# Patient Record
Sex: Female | Born: 1940 | Race: White | Hispanic: No | Marital: Married | Smoking: Current some day smoker
Health system: Southern US, Community
[De-identification: ages and names within clinical notes are randomized; demographics above are authoritative.]

## PROBLEM LIST (undated history)

## (undated) DIAGNOSIS — N390 Urinary tract infection, site not specified: Secondary | ICD-10-CM

## (undated) DIAGNOSIS — I639 Cerebral infarction, unspecified: Secondary | ICD-10-CM

## (undated) DIAGNOSIS — Z720 Tobacco use: Secondary | ICD-10-CM

## (undated) DIAGNOSIS — R42 Dizziness and giddiness: Secondary | ICD-10-CM

## (undated) DIAGNOSIS — F0390 Unspecified dementia without behavioral disturbance: Secondary | ICD-10-CM

## (undated) DIAGNOSIS — I10 Essential (primary) hypertension: Secondary | ICD-10-CM

---

## 1998-04-13 ENCOUNTER — Other Ambulatory Visit: Admission: RE | Admit: 1998-04-13 | Discharge: 1998-04-13 | Payer: Self-pay | Admitting: Family Medicine

## 2013-08-19 ENCOUNTER — Encounter (HOSPITAL_COMMUNITY): Payer: Self-pay | Admitting: Emergency Medicine

## 2013-08-19 ENCOUNTER — Emergency Department (HOSPITAL_COMMUNITY): Payer: Medicare Other

## 2013-08-19 ENCOUNTER — Inpatient Hospital Stay (HOSPITAL_COMMUNITY)
Admission: EM | Admit: 2013-08-19 | Discharge: 2013-08-20 | DRG: 066 | Disposition: A | Discharge status: Home / Self Care | Payer: Medicare Other | Attending: Internal Medicine | Admitting: Internal Medicine

## 2013-08-19 DIAGNOSIS — I6789 Other cerebrovascular disease: Secondary | ICD-10-CM | POA: Diagnosis present

## 2013-08-19 DIAGNOSIS — I639 Cerebral infarction, unspecified: Secondary | ICD-10-CM | POA: Diagnosis present

## 2013-08-19 DIAGNOSIS — F17200 Nicotine dependence, unspecified, uncomplicated: Secondary | ICD-10-CM | POA: Diagnosis present

## 2013-08-19 DIAGNOSIS — E785 Hyperlipidemia, unspecified: Secondary | ICD-10-CM | POA: Diagnosis present

## 2013-08-19 DIAGNOSIS — I63522 Cerebral infarction due to unspecified occlusion or stenosis of left anterior cerebral artery: Secondary | ICD-10-CM | POA: Diagnosis present

## 2013-08-19 DIAGNOSIS — R42 Dizziness and giddiness: Secondary | ICD-10-CM

## 2013-08-19 DIAGNOSIS — I10 Essential (primary) hypertension: Secondary | ICD-10-CM | POA: Diagnosis present

## 2013-08-19 DIAGNOSIS — I6350 Cerebral infarction due to unspecified occlusion or stenosis of unspecified cerebral artery: Principal | ICD-10-CM | POA: Diagnosis present

## 2013-08-19 DIAGNOSIS — Z720 Tobacco use: Secondary | ICD-10-CM

## 2013-08-19 DIAGNOSIS — R269 Unspecified abnormalities of gait and mobility: Secondary | ICD-10-CM | POA: Diagnosis present

## 2013-08-19 DIAGNOSIS — R29810 Facial weakness: Secondary | ICD-10-CM | POA: Diagnosis present

## 2013-08-19 HISTORY — DX: Tobacco use: Z72.0

## 2013-08-19 HISTORY — DX: Dizziness and giddiness: R42

## 2013-08-19 HISTORY — DX: Essential (primary) hypertension: I10

## 2013-08-19 LAB — BASIC METABOLIC PANEL
BUN: 10 mg/dL (ref 6–23)
CALCIUM: 10.4 mg/dL (ref 8.4–10.5)
CO2: 27 meq/L (ref 19–32)
Chloride: 102 mEq/L (ref 96–112)
Creatinine, Ser: 0.75 mg/dL (ref 0.50–1.10)
GFR calc Af Amer: >90 mL/min (ref >90)
GFR calc non Af Amer: 81 mL/min — ABNORMAL LOW (ref >90)
GLUCOSE: 111 mg/dL — AB (ref 70–99)
Potassium: 4.8 mEq/L (ref 3.7–5.3)
Sodium: 144 mEq/L (ref 137–147)

## 2013-08-19 LAB — I-STAT TROPONIN, ED: Troponin i, poc: 0 ng/mL (ref 0.00–0.08)

## 2013-08-19 LAB — URINALYSIS, ROUTINE W REFLEX MICROSCOPIC
BILIRUBIN URINE: NEGATIVE
Glucose, UA: NEGATIVE mg/dL
Hgb urine dipstick: NEGATIVE
Ketones, ur: NEGATIVE mg/dL
Leukocytes, UA: NEGATIVE
Nitrite: NEGATIVE
PH: 7.5 (ref 5.0–8.0)
Protein, ur: NEGATIVE mg/dL
SPECIFIC GRAVITY, URINE: 1.01 (ref 1.005–1.030)
Urobilinogen, UA: 0.2 mg/dL (ref 0.0–1.0)

## 2013-08-19 LAB — CBC
HCT: 45.7 % (ref 36.0–46.0)
HEMOGLOBIN: 16 g/dL — AB (ref 12.0–15.0)
MCH: 33.2 pg (ref 26.0–34.0)
MCHC: 35 g/dL (ref 30.0–36.0)
MCV: 94.8 fL (ref 78.0–100.0)
Platelets: 300 10*3/uL (ref 150–400)
RBC: 4.82 MIL/uL (ref 3.87–5.11)
RDW: 12.7 % (ref 11.5–15.5)
WBC: 6.2 10*3/uL (ref 4.0–10.5)

## 2013-08-19 MED ORDER — NAPROXEN SODIUM 220 MG PO TABS: 220.0000 mg | ORAL_TABLET | Freq: Every day | ORAL | Status: DC | PRN

## 2013-08-19 MED ORDER — ASPIRIN 300 MG RE SUPP
300.0000 mg | Freq: Every day | RECTAL | Status: DC
  Filled 2013-08-19: qty 1

## 2013-08-19 MED ORDER — ACETAMINOPHEN 325 MG PO TABS: 650.0000 mg | ORAL_TABLET | ORAL | Status: DC | PRN

## 2013-08-19 MED ORDER — LORAZEPAM 2 MG/ML IJ SOLN: 1.0000 mg | Freq: Once | INTRAMUSCULAR | Status: DC

## 2013-08-19 MED ORDER — LORAZEPAM 1 MG PO TABS
1.0000 mg | ORAL_TABLET | Freq: Once | ORAL | Status: AC
  Administered 2013-08-19: 1 mg via ORAL
  Filled 2013-08-19: qty 1

## 2013-08-19 MED ORDER — ACETAMINOPHEN 650 MG RE SUPP: 650.0000 mg | RECTAL | Status: DC | PRN

## 2013-08-19 MED ORDER — ASPIRIN 325 MG PO TABS
325.0000 mg | ORAL_TABLET | Freq: Every day | ORAL | Status: DC
  Administered 2013-08-19 – 2013-08-20 (×2): 325 mg via ORAL
  Filled 2013-08-19 (×2): qty 1

## 2013-08-19 MED ORDER — TETRAHYDROZOLINE HCL 0.05 % OP SOLN
2.0000 [drp] | Freq: Two times a day (BID) | OPHTHALMIC | Status: DC | PRN
  Filled 2013-08-19: qty 15

## 2013-08-19 MED ORDER — SENNOSIDES-DOCUSATE SODIUM 8.6-50 MG PO TABS: 1.0000 | ORAL_TABLET | Freq: Every evening | ORAL | Status: DC | PRN

## 2013-08-19 MED ORDER — ONDANSETRON HCL 4 MG/2ML IJ SOLN: 4.0000 mg | Freq: Four times a day (QID) | INTRAMUSCULAR | Status: DC | PRN

## 2013-08-19 MED ORDER — ENOXAPARIN SODIUM 40 MG/0.4ML ~~LOC~~ SOLN
40.0000 mg | SUBCUTANEOUS | Status: DC
  Filled 2013-08-19 (×2): qty 0.4

## 2013-08-19 MED ORDER — NAPROXEN 250 MG PO TABS
250.0000 mg | ORAL_TABLET | Freq: Every day | ORAL | Status: DC | PRN
  Filled 2013-08-19: qty 1

## 2013-08-19 MED ORDER — LORAZEPAM 2 MG/ML IJ SOLN: 0.5000 mg | Freq: Once | INTRAMUSCULAR | Status: DC

## 2013-08-19 MED ORDER — SODIUM CHLORIDE 0.9 % IV SOLN
INTRAVENOUS | Status: DC
  Administered 2013-08-19: 16:00:00 via INTRAVENOUS

## 2013-08-19 NOTE — ED Notes (Signed)
Dr. Thad Rangereynolds, Neurology also informed of pts facial droop which was changed from the baseline NIH.

## 2013-08-19 NOTE — ED Notes (Signed)
Called MRI and requested that they call 30 minutes prior to the exam so I could administer the Ativan. MRI tech agreed to do so.

## 2013-08-19 NOTE — ED Provider Notes (Addendum)
CSN: [id number]     Arrival date & time 08/19/13  7564 History   First MD Initiated Contact with Patient 08/19/13 1010     Chief Complaint  Patient presents with  . Dizziness     (Consider location/radiation/quality/duration/timing/severity/associated sxs/prior Treatment) Patient is a 73 y.o. female presenting with dizziness. The history is provided by the patient.  Dizziness Description: feeling off balance and drunk. Severity:  Moderate Onset quality:  Gradual Duration:  1 week Timing:  Intermittent Progression:  Unchanged Chronicity:  New Context comment:  Seems to be most significant when she has been on her feet for awhile and gets tired and then tries to walk Relieved by: sitting down and resting. Exacerbated by: walking. Ineffective treatments:  None tried Associated symptoms: headaches   Associated symptoms: no chest pain, no diarrhea, no nausea, no palpitations, no shortness of breath, no syncope, no vision changes, no vomiting and no weakness   Associated symptoms comment:  Headache about 6 days ago when sx started but has not continued and no headache now Risk factors: hx of vertigo   Risk factors: no hx of stroke and no new medications     Past Medical History  Diagnosis Date  . Hypertension   . Vertigo    History reviewed. No pertinent past surgical history. History reviewed. No pertinent family history. History  Substance Use Topics  . Smoking status: Current Some Day Smoker    Types: Cigarettes  . Smokeless tobacco: Not on file  . Alcohol Use: No   OB History   Grav Para Term Preterm Abortions TAB SAB Ect Mult Living                 Review of Systems  Respiratory: Negative for shortness of breath.   Cardiovascular: Negative for chest pain, palpitations and syncope.  Gastrointestinal: Negative for nausea, vomiting and diarrhea.  Neurological: Positive for dizziness and headaches.  All other systems reviewed and are negative.     Allergies   Review of patient's allergies indicates no known allergies.  Home Medications  No current outpatient prescriptions on file. BP 190/91  Pulse 88  Temp(Src) 98.5 F (36.9 C) (Oral)  Resp 18  Ht 5\' 4"  (1.626 m)  Wt 142 lb (64.411 kg)  BMI 24.36 kg/m2  SpO2 99% Physical Exam  Nursing note and vitals reviewed. Constitutional: She is oriented to person, place, and time. She appears well-developed and well-nourished. No distress.  HENT:  Head: Normocephalic and atraumatic.  Right Ear: Tympanic membrane and ear canal normal.  Left Ear: Tympanic membrane and ear canal normal.  Mouth/Throat: Oropharynx is clear and moist.  Eyes: Conjunctivae and EOM are normal. Pupils are equal, round, and reactive to light.  Neck: Normal range of motion. Neck supple.  Cardiovascular: Normal rate, regular rhythm and intact distal pulses.   No murmur heard. Pulmonary/Chest: Effort normal and breath sounds normal. No respiratory distress. She has no wheezes. She has no rales.  Abdominal: Soft. She exhibits no distension. There is no tenderness. There is no rebound and no guarding.  Musculoskeletal: Normal range of motion. She exhibits no edema and no tenderness.  Neurological: She is alert and oriented to person, place, and time. She has normal strength. No cranial nerve deficit or sensory deficit. Coordination normal.  Normal finger to nose and heel to shin testing.  NO visual field cuts  Skin: Skin is warm and dry. No rash noted. No erythema.  Psychiatric: She has a normal mood and affect. Her behavior  is normal.    ED Course  Procedures (including critical care time) Labs Review Labs Reviewed  CBC - Abnormal; Notable for the following:    Hemoglobin 16.0 (*)    All other components within normal limits  BASIC METABOLIC PANEL - Abnormal; Notable for the following:    Glucose, Bld 111 (*)    GFR calc non Af Amer 81 (*)    All other components within normal limits  URINALYSIS, ROUTINE W REFLEX  MICROSCOPIC  I-STAT TROPOININ, ED   Imaging Review Dg Chest 2 View  08/19/2013   CLINICAL DATA:  Stroke  EXAM: CHEST  2 VIEW  COMPARISON:  None.  FINDINGS: The heart size and mediastinal contours are within normal limits. Both lungs are clear. The visualized skeletal structures are unremarkable.  IMPRESSION: No active cardiopulmonary disease.   Electronically Signed   By: Salome Holmes M.D.   On: 08/19/2013 11:28   Ct Head Wo Contrast  08/19/2013   CLINICAL DATA:  Abnormal gait  EXAM: CT HEAD WITHOUT CONTRAST  TECHNIQUE: Contiguous axial images were obtained from the base of the skull through the vertex without intravenous contrast.  COMPARISON:  None.  FINDINGS: No acute intracranial abnormality. Specifically, no hemorrhage, hydrocephalus, mass lesion, acute infarction, or significant intracranial injury. No acute calvarial abnormality. Age-appropriate atrophy. Scattered of mild areas of low-attenuation within the deep white matter regions. Consistent with small vessel white matter ischaemia. Likely mucous retention cyst base of the right maxillary sinus. Areas of mucosal thickening and opacification appreciated within the ethmoid air cells and to a lesser extent the sphenoid sinuses. The mastoid air cells are patent. .  IMPRESSION: No acute intracranial abnormality. Chronic and involutional changes.  Sinus disease within the ethmoid air cells, sphenoid air cells.  Likely mucous retention cyst within the right maxillary sinus.   Electronically Signed   By: Salome Holmes M.D.   On: 08/19/2013 11:40   Mr Maxine Glenn Head Wo Contrast  08/19/2013   CLINICAL DATA:  Dizziness.  Cerebellar stroke versus vertigo.  EXAM: MRI HEAD WITHOUT CONTRAST  MRA HEAD WITHOUT CONTRAST  TECHNIQUE: Multiplanar, multiecho pulse sequences of the brain and surrounding structures were obtained without intravenous contrast. Angiographic images of the head were obtained using MRA technique without contrast.  COMPARISON:  Head CT 08/19/2013   FINDINGS: MRI HEAD FINDINGS  Incidental note is made of a partially empty sella. There are several small foci of acute infarction in the left lateral lenticulostriate territory involving the corona radiata and lentiform nucleus. Small, scattered foci of T2 hyperintensity are present throughout the subcortical and deep cerebral white matter bilaterally, nonspecific but compatible with mild chronic small vessel ischemic disease. There is mild-to-moderate generalized cerebral atrophy. There is no evidence of mass, midline shift, or extra-axial fluid collection.  Orbits are unremarkable. Right maxillary sinus mucous retention cyst is noted. There is mild bilateral ethmoid and left sphenoid sinus mucosal thickening. Mastoid air cells are clear. Major intracranial vascular flow voids are preserved.  MRA HEAD FINDINGS  Visualized distal vertebral arteries are patent and codominant. PICA origins are patent bilaterally. AICA origins are patent. Basilar artery is patent without stenosis. The left P1 segment is hypoplastic, and there is a prominent left posterior communicating artery. PCAs are otherwise unremarkable.  Internal carotid arteries are patent from skullbase to carotid termini. Symmetrically diminished signal within the anterior cavernous carotids bilaterally is likely artifactual. The left supraclinoid ICA is slightly smaller in caliber than the right without focal stenosis. The left A1 segment  is markedly hypoplastic, with the majority of flow in the left ACA being supplied from the right via the anterior communicating artery. ACAs are otherwise unremarkable. MCAs are unremarkable. No intracranial aneurysm is identified.  IMPRESSION: 1. Small, acute left lateral lenticulostriate infarct. 2. 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 3. No evidence of major intracranial arterial occlusion or significant proximal stenosis.   Electronically Signed   By: Sebastian AcheAllen  Grady   On: 08/19/2013 13:54   Mr  Brain Wo Contrast  08/19/2013   CLINICAL DATA:  Dizziness.  Cerebellar stroke versus vertigo.  EXAM: MRI HEAD WITHOUT CONTRAST  MRA HEAD WITHOUT CONTRAST  TECHNIQUE: Multiplanar, multiecho pulse sequences of the brain and surrounding structures were obtained without intravenous contrast. Angiographic images of the head were obtained using MRA technique without contrast.  COMPARISON:  Head CT 08/19/2013  FINDINGS: MRI HEAD FINDINGS  Incidental note is made of a partially empty sella. There are several small foci of acute infarction in the left lateral lenticulostriate territory involving the corona radiata and lentiform nucleus. Small, scattered foci of T2 hyperintensity are present throughout the subcortical and deep cerebral white matter bilaterally, nonspecific but compatible with mild chronic small vessel ischemic disease. There is mild-to-moderate generalized cerebral atrophy. There is no evidence of mass, midline shift, or extra-axial fluid collection.  Orbits are unremarkable. Right maxillary sinus mucous retention cyst is noted. There is mild bilateral ethmoid and left sphenoid sinus mucosal thickening. Mastoid air cells are clear. Major intracranial vascular flow voids are preserved.  MRA HEAD FINDINGS  Visualized distal vertebral arteries are patent and codominant. PICA origins are patent bilaterally. AICA origins are patent. Basilar artery is patent without stenosis. The left P1 segment is hypoplastic, and there is a prominent left posterior communicating artery. PCAs are otherwise unremarkable.  Internal carotid arteries are patent from skullbase to carotid termini. Symmetrically diminished signal within the anterior cavernous carotids bilaterally is likely artifactual. The left supraclinoid ICA is slightly smaller in caliber than the right without focal stenosis. The left A1 segment is markedly hypoplastic, with the majority of flow in the left ACA being supplied from the right via the anterior  communicating artery. ACAs are otherwise unremarkable. MCAs are unremarkable. No intracranial aneurysm is identified.  IMPRESSION: 1. Small, acute left lateral lenticulostriate infarct. 2. 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 3. No evidence of major intracranial arterial occlusion or significant proximal stenosis.   Electronically Signed   By: Sebastian AcheAllen  Grady   On: 08/19/2013 13:54     EKG Interpretation   Date/Time:  Thursday August 19 2013 09:50:39 EDT Ventricular Rate:  85 PR Interval:  156 QRS Duration: 80 QT Interval:  362 QTC Calculation: 430 R Axis:   41 Text Interpretation:  Normal sinus rhythm Right atrial enlargement No  previous tracing Confirmed by Anitra LauthPLUNKETT  MD, Alphonzo LemmingsWHITNEY ([id number]) on 08/19/2013  10:21:52 AM      MDM   Final diagnoses:  Stroke    Pt with sx most concerning for cerebellar stroke vs vertigo that started most likely about 1 week ago.  No systemic or infectious sx.  Normal neuro exam without weakness, visual field cuts or overt cerebellar findings however pt states sx are not prominent until she had been up for a long time.  Normal vision.  Sx are not reproducible with movement of the head and attempting to walk.  No hx of Stroke but possible hx of untreated hypertension and HTN here.  Evaluate for stroke starting  with a CT but if negative will get an MRI of the brain. CBC, BMP, UA, troponin pending. EKG without acute findings.   11:56 AM Initial labs and imaging without acute findings.  MRI pending.  CT does show sinus dx.  Pt passed swallow study.  BP improved without intervention to 143/80  1:59 PM MRI positive for acute infarct and will admit for full stroke work up. Gwyneth Sprout, MD 08/19/13 1157  Gwyneth Sprout, MD 08/19/13 1400  Gwyneth Sprout, MD 08/19/13 1504

## 2013-08-19 NOTE — Consult Note (Signed)
Referring Physician: Janee Morn    Chief Complaint: Stroke  HPI:                                                                                                                                         Yolanda Hopkins is an 74 y.o. female who has been diagnosed with vertigo "by her dentist" a long time ago.  For the last two week she has been having a sensation like she was going to have vertigo but she would shut her eyes and pray --the sensation would pass.  Today she woke up feeling generally fatigued.  She took a bath and told her husband.  He decided to bring her to hospital.  She states she felt as though she was going to have vertigo but never actually had the sensation. Other than feeling generally weak, she denies any unilateral weakness, visual problem, dysarthric, dysphagia, sensory abnormalities. Of note--she feels ASA in the past has caused her to have kidney stones. She currently is not on ASA.   Date last known well: Date: 08/19/2013 Time last known well: Time: 20:00 tPA Given: No: out of window and minimal symptoms  Past Medical History  Diagnosis Date  . Hypertension   . Vertigo   . Tobacco abuse 08/19/2013    History reviewed. No pertinent past surgical history.  Family History  Problem Relation Age of Onset  . Hypertension Mother   . Hypertension Father   . Liver cancer Father    Social History:  reports that she has been smoking Cigarettes.  She has been smoking about 0.00 packs per day. She does not have any smokeless tobacco history on file. She reports that she does not drink alcohol or use illicit drugs.  Allergies:  Allergies  Allergen Reactions  . Sulfur Nausea Only    Medications:                                                                                                                           Current Facility-Administered Medications  Medication Dose Route Frequency Provider Last Rate Last Dose  . 0.9 %  sodium chloride infusion   Intravenous  Continuous Rodolph Bong, MD 75 mL/hr at 08/19/13 1600    . acetaminophen (TYLENOL) tablet 650 mg  650 mg Oral Q4H PRN Rodolph Bong, MD       Or  .  acetaminophen (TYLENOL) suppository 650 mg  650 mg Rectal Q4H PRN Rodolph Bong, MD      . aspirin suppository 300 mg  300 mg Rectal Daily Rodolph Bong, MD       Or  . aspirin tablet 325 mg  325 mg Oral Daily Rodolph Bong, MD   325 mg at 08/19/13 1601  . enoxaparin (LOVENOX) injection 40 mg  40 mg Subcutaneous Q24H Rodolph Bong, MD      . ondansetron [facility]) injection 4 mg  4 mg Intravenous Q6H PRN Rodolph Bong, MD      . senna-docusate (Senokot-S) tablet 1 tablet  1 tablet Oral QHS PRN Rodolph Bong, MD       Current Outpatient Prescriptions  Medication Sig Dispense Refill  . diphenhydrAMINE (BENADRYL) 25 MG tablet Take 25 mg by mouth every 6 (six) hours as needed for allergies.      . naproxen sodium (ANAPROX) 220 MG tablet Take 220 mg by mouth daily as needed (pain).      . Tetrahydrozoline HCl (VISINE OP) Place 2 drops into both eyes 2 (two) times daily as needed (dry eyes).         ROS:                                                                                                                                       History obtained from the patient  General ROS: negative for - chills, fatigue, fever, night sweats, weight gain or weight loss Psychological ROS: negative for - behavioral disorder, hallucinations, memory difficulties, mood swings or suicidal ideation Ophthalmic ROS: negative for - blurry vision, double vision, eye pain or loss of vision ENT ROS: negative for - epistaxis, nasal discharge, oral lesions, sore throat, tinnitus or vertigo Allergy and Immunology ROS: negative for - hives or itchy/watery eyes Hematological and Lymphatic ROS: negative for - bleeding problems, bruising or swollen lymph nodes Endocrine ROS: negative for - galactorrhea, hair pattern changes, polydipsia/polyuria  or temperature intolerance Respiratory ROS: negative for - cough, hemoptysis, shortness of breath or wheezing Cardiovascular ROS: negative for - chest pain, dyspnea on exertion, edema or irregular heartbeat Gastrointestinal ROS: negative for - abdominal pain, diarrhea, hematemesis, nausea/vomiting or stool incontinence Genito-Urinary ROS: negative for - dysuria, hematuria, incontinence or urinary frequency/urgency Musculoskeletal ROS: negative for - joint swelling or muscular weakness Neurological ROS: as noted in HPI Dermatological ROS: negative for rash and skin lesion changes  Neurologic Examination:  Blood pressure 162/87, pulse 94, temperature 97.4 F (36.3 C), temperature source Oral, resp. rate 16, height 5\' 4"  (1.626 m), weight 64.411 kg (142 lb), SpO2 98.00%.   Mental Status: Alert, oriented, thought content appropriate.  Speech fluent without evidence of aphasia.  Able to follow 3 step commands without difficulty. Cranial Nerves: II: Discs flat bilaterally; Visual fields grossly normal, pupils equal, round, reactive to light and accommodation III,IV, VI: ptosis not present, extra-ocular motions intact bilaterally V,VII: smile asymmetric on the left, facial light touch sensation normal bilaterally VIII: hearing normal bilaterally IX,X: gag reflex present XI: bilateral shoulder shrug XII: midline tongue extension without atrophy or fasciculations  Motor: Right : Upper extremity   5/5    Left:     Upper extremity   5/5  Lower extremity   5/5     Lower extremity   5/5 --slight pronator drift on the right arm and slight drift on the right leg Tone and bulk:normal tone throughout; no atrophy noted Sensory: Pinprick and light touch intact throughout, bilaterally Deep Tendon Reflexes:  Right: Upper Extremity   Left: Upper extremity   biceps (C-5 to C-6) 2/4   biceps (C-5 to C-6)  2/4 tricep (C7) 2/4    triceps (C7) 2/4 Brachioradialis (C6) 2/4  Brachioradialis (C6) 2/4  Lower Extremity Lower Extremity  quadriceps (L-2 to L-4) 2/4   quadriceps (L-2 to L-4) 2/4 Achilles (S1) 1/4   Achilles (S1) 1/4  Plantars: Right: downgoing   Left: downgoing Cerebellar: normal finger-to-nose,  normal heel-to-shin test Gait: Unable to test CV: pulses palpable throughout    Lab Results: Basic Metabolic Panel:  Recent Labs Lab 08/19/13 0949  NA 144  K 4.8  CL 102  CO2 27  GLUCOSE 111*  BUN 10  CREATININE 0.75  CALCIUM 10.4    Liver Function Tests: No results found for this basename: AST, ALT, ALKPHOS, BILITOT, PROT, ALBUMIN,  in the last 168 hours No results found for this basename: LIPASE, AMYLASE,  in the last 168 hours No results found for this basename: AMMONIA,  in the last 168 hours  CBC:  Recent Labs Lab 08/19/13 0949  WBC 6.2  HGB 16.0*  HCT 45.7  MCV 94.8  PLT 300    Cardiac Enzymes: No results found for this basename: CKTOTAL, CKMB, CKMBINDEX, TROPONINI,  in the last 168 hours  Lipid Panel: No results found for this basename: CHOL, TRIG, HDL, CHOLHDL, VLDL, LDLCALC,  in the last 168 hours  CBG: No results found for this basename: GLUCAP,  in the last 168 hours  Microbiology: No results found for this or any previous visit.  Coagulation Studies: No results found for this basename: LABPROT, INR,  in the last 72 hours  Imaging: Dg Chest 2 View  08/19/2013   CLINICAL DATA:  Stroke  EXAM: CHEST  2 VIEW  COMPARISON:  None.  FINDINGS: The heart size and mediastinal contours are within normal limits. Both lungs are clear. The visualized skeletal structures are unremarkable.  IMPRESSION: No active cardiopulmonary disease.   Electronically Signed   By: Salome HolmesHector  Cooper M.D.   On: 08/19/2013 11:28   Ct Head Wo Contrast  08/19/2013   CLINICAL DATA:  Abnormal gait  EXAM: CT HEAD WITHOUT CONTRAST  TECHNIQUE: Contiguous axial images were obtained from  the base of the skull through the vertex without intravenous contrast.  COMPARISON:  None.  FINDINGS: No acute intracranial abnormality. Specifically, no hemorrhage, hydrocephalus, mass lesion, acute infarction, or significant intracranial injury. No  acute calvarial abnormality. Age-appropriate atrophy. Scattered of mild areas of low-attenuation within the deep white matter regions. Consistent with small vessel white matter ischaemia. Likely mucous retention cyst base of the right maxillary sinus. Areas of mucosal thickening and opacification appreciated within the ethmoid air cells and to a lesser extent the sphenoid sinuses. The mastoid air cells are patent. .  IMPRESSION: No acute intracranial abnormality. Chronic and involutional changes.  Sinus disease within the ethmoid air cells, sphenoid air cells.  Likely mucous retention cyst within the right maxillary sinus.   Electronically Signed   By: Salome Holmes M.D.   On: 08/19/2013 11:40   Mr Maxine Glenn Head Wo Contrast  08/19/2013   CLINICAL DATA:  Dizziness.  Cerebellar stroke versus vertigo.  EXAM: MRI HEAD WITHOUT CONTRAST  MRA HEAD WITHOUT CONTRAST  TECHNIQUE: Multiplanar, multiecho pulse sequences of the brain and surrounding structures were obtained without intravenous contrast. Angiographic images of the head were obtained using MRA technique without contrast.  COMPARISON:  Head CT 08/19/2013  FINDINGS: MRI HEAD FINDINGS  Incidental note is made of a partially empty sella. There are several small foci of acute infarction in the left lateral lenticulostriate territory involving the corona radiata and lentiform nucleus. Small, scattered foci of T2 hyperintensity are present throughout the subcortical and deep cerebral white matter bilaterally, nonspecific but compatible with mild chronic small vessel ischemic disease. There is mild-to-moderate generalized cerebral atrophy. There is no evidence of mass, midline shift, or extra-axial fluid collection.  Orbits are  unremarkable. Right maxillary sinus mucous retention cyst is noted. There is mild bilateral ethmoid and left sphenoid sinus mucosal thickening. Mastoid air cells are clear. Major intracranial vascular flow voids are preserved.  MRA HEAD FINDINGS  Visualized distal vertebral arteries are patent and codominant. PICA origins are patent bilaterally. AICA origins are patent. Basilar artery is patent without stenosis. The left P1 segment is hypoplastic, and there is a prominent left posterior communicating artery. PCAs are otherwise unremarkable.  Internal carotid arteries are patent from skullbase to carotid termini. Symmetrically diminished signal within the anterior cavernous carotids bilaterally is likely artifactual. The left supraclinoid ICA is slightly smaller in caliber than the right without focal stenosis. The left A1 segment is markedly hypoplastic, with the majority of flow in the left ACA being supplied from the right via the anterior communicating artery. ACAs are otherwise unremarkable. MCAs are unremarkable. No intracranial aneurysm is identified.  IMPRESSION: 1. Small, acute left lateral lenticulostriate infarct. 2. 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 3. No evidence of major intracranial arterial occlusion or significant proximal stenosis.   Electronically Signed   By: Sebastian Ache   On: 08/19/2013 13:54   Mr Brain Wo Contrast  08/19/2013   CLINICAL DATA:  Dizziness.  Cerebellar stroke versus vertigo.  EXAM: MRI HEAD WITHOUT CONTRAST  MRA HEAD WITHOUT CONTRAST  TECHNIQUE: Multiplanar, multiecho pulse sequences of the brain and surrounding structures were obtained without intravenous contrast. Angiographic images of the head were obtained using MRA technique without contrast.  COMPARISON:  Head CT 08/19/2013  FINDINGS: MRI HEAD FINDINGS  Incidental note is made of a partially empty sella. There are several small foci of acute infarction in the left lateral lenticulostriate  territory involving the corona radiata and lentiform nucleus. Small, scattered foci of T2 hyperintensity are present throughout the subcortical and deep cerebral white matter bilaterally, nonspecific but compatible with mild chronic small vessel ischemic disease. There is mild-to-moderate generalized cerebral atrophy. There is no evidence of  mass, midline shift, or extra-axial fluid collection.  Orbits are unremarkable. Right maxillary sinus mucous retention cyst is noted. There is mild bilateral ethmoid and left sphenoid sinus mucosal thickening. Mastoid air cells are clear. Major intracranial vascular flow voids are preserved.  MRA HEAD FINDINGS  Visualized distal vertebral arteries are patent and codominant. PICA origins are patent bilaterally. AICA origins are patent. Basilar artery is patent without stenosis. The left P1 segment is hypoplastic, and there is a prominent left posterior communicating artery. PCAs are otherwise unremarkable.  Internal carotid arteries are patent from skullbase to carotid termini. Symmetrically diminished signal within the anterior cavernous carotids bilaterally is likely artifactual. The left supraclinoid ICA is slightly smaller in caliber than the right without focal stenosis. The left A1 segment is markedly hypoplastic, with the majority of flow in the left ACA being supplied from the right via the anterior communicating artery. ACAs are otherwise unremarkable. MCAs are unremarkable. No intracranial aneurysm is identified.  IMPRESSION: 1. Small, acute left lateral lenticulostriate infarct. 2. 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 3. No evidence of major intracranial arterial occlusion or significant proximal stenosis.   Electronically Signed   By: Sebastian Ache   On: 08/19/2013 13:54    Felicie Morn PA-C [facility] [phone]  08/19/2013, 5:01 PM   Patient seen and examined.  Clinical course and management discussed.  Necessary edits  performed.  I agree with the above.  Assessment and plan of care developed and discussed below.    Assessment: 73 y.o. female presenting with complaints vertigo and elevated blood pressure.  Neurological examination only significant for a mild facial droop.  MRI of the brain reviewed and shows a left acute lateral lenticulostriate infarct.  Further work up recommended.    Stroke Risk Factors - hypertension  Recommendations: 1. HgbA1c, fasting lipid panel 2. PT consult, OT consult, Speech consult 3. Echocardiogram 4. Carotid dopplers 5. Prophylactic therapy-Antiplatelet med: Aspirin - dose 325mg  daily 6. Risk factor modification 7. Telemetry monitoring 8. Frequent neuro checks   Thana Farr, MD [facility] [phone]  08/19/2013  5:07 PM

## 2013-08-19 NOTE — ED Notes (Signed)
PT moved to C-28 waiting for ADM. Bed.

## 2013-08-19 NOTE — ED Notes (Signed)
Neuro PA informed of facial droop which was changed from the baseline NIH.

## 2013-08-19 NOTE — Progress Notes (Signed)
Attempted to get get report from ED twice. Told Nurse on the phone giving report and will call back. Awaiting return call.

## 2013-08-19 NOTE — ED Notes (Signed)
She states for past week shes had dizziness every time she stands for the past week. shes also had a headache and her BP has been high when she checked at home

## 2013-08-19 NOTE — H&P (Signed)
[facility] History and Physical  Yolanda Hopkins WUJ:[id number] DOB: [birth year] DOA: 08/19/2013  Referring physician: Dr Anitra Lauth PCP: Josue Hector, MD   Chief Complaint: dizziness and elevated BP.  HPI: Yolanda Hopkins is a 73 y.o. female  With past medical history significant for vertigo and per patient recently elevated blood pressure who presents to the ED with a six-day history of dizziness and elevated blood pressure. Patient states that the dizziness has been intermittent and which was doing house chores would have to sit down when she felt tied and as on the dizziness with sitting. Patient says she had to use the banisters to support herself and they have abnormal gait. Patient denies any spinning sensation. Patient denies any fevers, no chills, no chest pain, no nausea, no vomiting, nondominant pain, no melena, no hematemesis, no hematochezia. Patient denies any numbness or tingling. Patient denies any asymmetric weakness. Patient denies any facial droop. Patient does endorse some generalized weakness to the point where she dropped a coffee this morning on the day of admission. Patient's husband said that he checked her blood pressures on the day of admission and was elevated at 173/100 with a pulse rate of approximately 110. Patient also some complaints of right great toe pain. Patient was seen in the emergency room CT of the head which was done was negative. MRI of the head which was done was consistent with an acute stroke. Basic metabolic profile is unremarkable. CBC was unremarkable. Urinalysis was unremarkable. EKG showed normal sinus rhythm and right atrial enlargement. Due to timing of presentation patient was deemed not to be a TPA candidate. We were consulted to admit the patient for further evaluation and management.   Review of Systems: As per HPI otherwise negative. Constitutional:  No weight loss, night sweats, Fevers, chills, fatigue.  HEENT:  No  headaches, Difficulty swallowing,Tooth/dental problems,Sore throat,  No sneezing, itching, ear ache, nasal congestion, post nasal drip,  Cardio-vascular:  No chest pain, Orthopnea, PND, swelling in lower extremities, anasarca, dizziness, palpitations  GI:  No heartburn, indigestion, abdominal pain, nausea, vomiting, diarrhea, change in bowel habits, loss of appetite  Resp:  No shortness of breath with exertion or at rest. No excess mucus, no productive cough, No non-productive cough, No coughing up of blood.No change in color of mucus.No wheezing.No chest wall deformity  Skin:  no rash or lesions.  GU:  no dysuria, change in color of urine, no urgency or frequency. No flank pain.  Musculoskeletal:  No joint pain or swelling. No decreased range of motion. No back pain.  Psych:  No change in mood or affect. No depression or anxiety. No memory loss.   Past Medical History  Diagnosis Date  . Hypertension   . Vertigo   . Tobacco abuse 08/19/2013   History reviewed. No pertinent past surgical history. Social History:  reports that she has been smoking Cigarettes.  She has been smoking about 0.00 packs per day. She does not have any smokeless tobacco history on file. She reports that she does not drink alcohol or use illicit drugs.  Allergies  Allergen Reactions  . Sulfur Nausea Only    History reviewed. No pertinent family history.   Prior to Admission medications   Medication Sig Start Date End Date Taking? Authorizing Provider  diphenhydrAMINE (BENADRYL) 25 MG tablet Take 25 mg by mouth every 6 (six) hours as needed for allergies.   Yes Historical Provider, MD  naproxen sodium (ANAPROX) 220 MG tablet Take 220 mg by mouth  daily as needed (pain).   Yes Historical Provider, MD  Tetrahydrozoline HCl (VISINE OP) Place 2 drops into both eyes 2 (two) times daily as needed (dry eyes).   Yes Historical Provider, MD   Physical Exam: Filed Vitals:   08/19/13 1530  BP: 161/80  Pulse: 71    Temp:   Resp: 15    BP 161/80  Pulse 71  Temp(Src) 97.4 F (36.3 C) (Oral)  Resp 15  Ht 5\' 4"  (1.626 m)  Wt 64.411 kg (142 lb)  BMI 24.36 kg/m2  SpO2 96%  General:  Appears calm and comfortable Eyes: PERRLA, normal lids, irises & conjunctiva ENT: grossly normal hearing, lips & tongue. Dry mucous membranes. Neck: no LAD, masses or thyromegaly Cardiovascular: RRR, no m/r/g. No LE edema. Telemetry: NSR  Respiratory: CTA bilaterally, no w/r/r. Normal respiratory effort. Abdomen: soft, ntnd, positive bowel sounds, no rebound, no guarding. Skin: no rash or induration seen on limited exam Musculoskeletal: grossly normal tone BUE/BLE Psychiatric: grossly normal mood and affect, speech fluent and appropriate Neurologic: Alert and oriented x3. Cranial nerves II through XII are grossly intact. No focal deficits. Finger-to-nose is intact. Visual fields are intact. Sensation is intact. Unable to assess gait secondary to safety.           Labs on Admission:  Basic Metabolic Panel:  Recent Labs Lab 08/19/13 0949  NA 144  K 4.8  CL 102  CO2 27  GLUCOSE 111*  BUN 10  CREATININE 0.75  CALCIUM 10.4   Liver Function Tests: No results found for this basename: AST, ALT, ALKPHOS, BILITOT, PROT, ALBUMIN,  in the last 168 hours No results found for this basename: LIPASE, AMYLASE,  in the last 168 hours No results found for this basename: AMMONIA,  in the last 168 hours CBC:  Recent Labs Lab 08/19/13 0949  WBC 6.2  HGB 16.0*  HCT 45.7  MCV 94.8  PLT 300   Cardiac Enzymes: No results found for this basename: CKTOTAL, CKMB, CKMBINDEX, TROPONINI,  in the last 168 hours  BNP (last 3 results) No results found for this basename: PROBNP,  in the last 8760 hours CBG: No results found for this basename: GLUCAP,  in the last 168 hours  Radiological Exams on Admission: Dg Chest 2 View  08/19/2013   CLINICAL DATA:  Stroke  EXAM: CHEST  2 VIEW  COMPARISON:  None.  FINDINGS: The heart  size and mediastinal contours are within normal limits. Both lungs are clear. The visualized skeletal structures are unremarkable.  IMPRESSION: No active cardiopulmonary disease.   Electronically Signed   By: Salome Holmes M.D.   On: 08/19/2013 11:28   Ct Head Wo Contrast  08/19/2013   CLINICAL DATA:  Abnormal gait  EXAM: CT HEAD WITHOUT CONTRAST  TECHNIQUE: Contiguous axial images were obtained from the base of the skull through the vertex without intravenous contrast.  COMPARISON:  None.  FINDINGS: No acute intracranial abnormality. Specifically, no hemorrhage, hydrocephalus, mass lesion, acute infarction, or significant intracranial injury. No acute calvarial abnormality. Age-appropriate atrophy. Scattered of mild areas of low-attenuation within the deep white matter regions. Consistent with small vessel white matter ischaemia. Likely mucous retention cyst base of the right maxillary sinus. Areas of mucosal thickening and opacification appreciated within the ethmoid air cells and to a lesser extent the sphenoid sinuses. The mastoid air cells are patent. .  IMPRESSION: No acute intracranial abnormality. Chronic and involutional changes.  Sinus disease within the ethmoid air cells, sphenoid air cells.  Likely mucous retention cyst within the right maxillary sinus.   Electronically Signed   By: Salome HolmesHector  Cooper M.D.   On: 08/19/2013 11:40   Mr Maxine GlennMra Head Wo Contrast  08/19/2013   CLINICAL DATA:  Dizziness.  Cerebellar stroke versus vertigo.  EXAM: MRI HEAD WITHOUT CONTRAST  MRA HEAD WITHOUT CONTRAST  TECHNIQUE: Multiplanar, multiecho pulse sequences of the brain and surrounding structures were obtained without intravenous contrast. Angiographic images of the head were obtained using MRA technique without contrast.  COMPARISON:  Head CT 08/19/2013  FINDINGS: MRI HEAD FINDINGS  Incidental note is made of a partially empty sella. There are several small foci of acute infarction in the left lateral lenticulostriate  territory involving the corona radiata and lentiform nucleus. Small, scattered foci of T2 hyperintensity are present throughout the subcortical and deep cerebral white matter bilaterally, nonspecific but compatible with mild chronic small vessel ischemic disease. There is mild-to-moderate generalized cerebral atrophy. There is no evidence of mass, midline shift, or extra-axial fluid collection.  Orbits are unremarkable. Right maxillary sinus mucous retention cyst is noted. There is mild bilateral ethmoid and left sphenoid sinus mucosal thickening. Mastoid air cells are clear. Major intracranial vascular flow voids are preserved.  MRA HEAD FINDINGS  Visualized distal vertebral arteries are patent and codominant. PICA origins are patent bilaterally. AICA origins are patent. Basilar artery is patent without stenosis. The left P1 segment is hypoplastic, and there is a prominent left posterior communicating artery. PCAs are otherwise unremarkable.  Internal carotid arteries are patent from skullbase to carotid termini. Symmetrically diminished signal within the anterior cavernous carotids bilaterally is likely artifactual. The left supraclinoid ICA is slightly smaller in caliber than the right without focal stenosis. The left A1 segment is markedly hypoplastic, with the majority of flow in the left ACA being supplied from the right via the anterior communicating artery. ACAs are otherwise unremarkable. MCAs are unremarkable. No intracranial aneurysm is identified.  IMPRESSION: 1. Small, acute left lateral lenticulostriate infarct. 2. 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 3. No evidence of major intracranial arterial occlusion or significant proximal stenosis.   Electronically Signed   By: Sebastian AcheAllen  Grady   On: 08/19/2013 13:54   Mr Brain Wo Contrast  08/19/2013   CLINICAL DATA:  Dizziness.  Cerebellar stroke versus vertigo.  EXAM: MRI HEAD WITHOUT CONTRAST  MRA HEAD WITHOUT CONTRAST  TECHNIQUE:  Multiplanar, multiecho pulse sequences of the brain and surrounding structures were obtained without intravenous contrast. Angiographic images of the head were obtained using MRA technique without contrast.  COMPARISON:  Head CT 08/19/2013  FINDINGS: MRI HEAD FINDINGS  Incidental note is made of a partially empty sella. There are several small foci of acute infarction in the left lateral lenticulostriate territory involving the corona radiata and lentiform nucleus. Small, scattered foci of T2 hyperintensity are present throughout the subcortical and deep cerebral white matter bilaterally, nonspecific but compatible with mild chronic small vessel ischemic disease. There is mild-to-moderate generalized cerebral atrophy. There is no evidence of mass, midline shift, or extra-axial fluid collection.  Orbits are unremarkable. Right maxillary sinus mucous retention cyst is noted. There is mild bilateral ethmoid and left sphenoid sinus mucosal thickening. Mastoid air cells are clear. Major intracranial vascular flow voids are preserved.  MRA HEAD FINDINGS  Visualized distal vertebral arteries are patent and codominant. PICA origins are patent bilaterally. AICA origins are patent. Basilar artery is patent without stenosis. The left P1 segment is hypoplastic, and there is a prominent left posterior  communicating artery. PCAs are otherwise unremarkable.  Internal carotid arteries are patent from skullbase to carotid termini. Symmetrically diminished signal within the anterior cavernous carotids bilaterally is likely artifactual. The left supraclinoid ICA is slightly smaller in caliber than the right without focal stenosis. The left A1 segment is markedly hypoplastic, with the majority of flow in the left ACA being supplied from the right via the anterior communicating artery. ACAs are otherwise unremarkable. MCAs are unremarkable. No intracranial aneurysm is identified.  IMPRESSION: 1. Small, acute left lateral  lenticulostriate infarct. 2. 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 3. No evidence of major intracranial arterial occlusion or significant proximal stenosis.   Electronically Signed   By: Sebastian Ache   On: 08/19/2013 13:54    EKG: Independently reviewed. NSR. RAE  Assessment/Plan Principal Problem:   Stroke Active Problems:   HTN (hypertension)   Tobacco abuse   Vertigo   Gait abnormality   CVA (cerebral infarction)  #1 acute CVA/stroke Patient presents with a six-day history of dizziness with some unsteady gait. Admit patient to neuro telemetry. CT of the head was negative. MRI of the head consistent with an acute stroke. Will admit patient for stroke workup. Will check carotid Dopplers. Check a 2-D echo. Check a fasting lipid panel. Check a hemoglobin A1c. Tobacco cessation. Will place on full dose aspirin. PT/OT/ST. Will consult with neurology for further evaluation and management.  #2 hypertension  per patient and husband patient has not had a history of hypertension. Patient states noted elevated blood pressures since onset of her symptoms. We'll allow permissive hypertension. Follow.  #3 tobacco abuse Tobacco cessation. Place on a nicotine patch.  #4 gait abnormalities Likely secondary to problem #1.  #5 prophylaxis Lovenox for DVT prophylaxis.  Code Status: Full Family Communication:Updated patient and husband at bedside. Disposition Plan: Admit to telemetry.  Time spent: 70 mins  Rodolph Bong MD [facility] Pager [phone]

## 2013-08-19 NOTE — ED Notes (Signed)
Report attempted x2

## 2013-08-20 DIAGNOSIS — R42 Dizziness and giddiness: Secondary | ICD-10-CM

## 2013-08-20 DIAGNOSIS — I6789 Other cerebrovascular disease: Secondary | ICD-10-CM

## 2013-08-20 LAB — CBC
HCT: 41.2 % (ref 36.0–46.0)
Hemoglobin: 14.2 g/dL (ref 12.0–15.0)
MCH: 32.5 pg (ref 26.0–34.0)
MCHC: 34.5 g/dL (ref 30.0–36.0)
MCV: 94.3 fL (ref 78.0–100.0)
PLATELETS: 255 10*3/uL (ref 150–400)
RBC: 4.37 MIL/uL (ref 3.87–5.11)
RDW: 12.6 % (ref 11.5–15.5)
WBC: 6 10*3/uL (ref 4.0–10.5)

## 2013-08-20 LAB — BASIC METABOLIC PANEL
BUN: 12 mg/dL (ref 6–23)
CALCIUM: 9.7 mg/dL (ref 8.4–10.5)
CHLORIDE: 105 meq/L (ref 96–112)
CO2: 26 mEq/L (ref 19–32)
CREATININE: 0.74 mg/dL (ref 0.50–1.10)
GFR calc non Af Amer: 82 mL/min — ABNORMAL LOW (ref >90)
Glucose, Bld: 94 mg/dL (ref 70–99)
Potassium: 4.2 mEq/L (ref 3.7–5.3)
Sodium: 144 mEq/L (ref 137–147)

## 2013-08-20 LAB — LIPID PANEL
Cholesterol: 281 mg/dL — ABNORMAL HIGH (ref 0–200)
HDL: 58 mg/dL (ref >39)
LDL Cholesterol: 199 mg/dL — ABNORMAL HIGH (ref 0–99)
Total CHOL/HDL Ratio: 4.8 RATIO
Triglycerides: 121 mg/dL (ref <150)
VLDL: 24 mg/dL (ref 0–40)

## 2013-08-20 LAB — HEMOGLOBIN A1C
Hgb A1c MFr Bld: 5.3 % (ref <5.7)
Mean Plasma Glucose: 105 mg/dL (ref <117)

## 2013-08-20 MED ORDER — ROSUVASTATIN CALCIUM 10 MG PO TABS: 10.0000 mg | ORAL_TABLET | Freq: Every day | ORAL | Status: DC

## 2013-08-20 MED ORDER — ATORVASTATIN CALCIUM 40 MG PO TABS
40.0000 mg | ORAL_TABLET | Freq: Every day | ORAL | Status: DC
  Filled 2013-08-20: qty 1

## 2013-08-20 MED ORDER — NICOTINE 14 MG/24HR TD PT24: 14.0000 mg | MEDICATED_PATCH | Freq: Every day | TRANSDERMAL | Status: DC

## 2013-08-20 MED ORDER — ASPIRIN 325 MG PO TABS: 325.0000 mg | ORAL_TABLET | Freq: Every day | ORAL | Status: DC

## 2013-08-20 MED ORDER — NICOTINE 14 MG/24HR TD PT24
14.0000 mg | MEDICATED_PATCH | Freq: Every day | TRANSDERMAL | Status: DC
  Filled 2013-08-20: qty 1

## 2013-08-20 NOTE — Evaluation (Signed)
Occupational Therapy Evaluation Patient Details Name: Yolanda Hopkins MRN: [id number] DOB: 1940/06/13 Today's Date: 08/20/2013    History of Present Illness pt presents with L Lateral Lenticulostriate Infarct.     Clinical Impression   Pt presents to OT with decreased I with ADL activity and will benefit from skilled OT to increase I with ADL activity and return to PLOF    Follow Up Recommendations  Home health OT    Equipment Recommendations  Tub/shower seat       Precautions / Restrictions Precautions Precautions: Fall Restrictions Weight Bearing Restrictions: No      Mobility Bed Mobility Overal bed mobility: Modified Independent                Transfers Overall transfer level: Needs assistance Equipment used: None Transfers: Sit to/from Stand Sit to Stand: Supervision         General transfer comment: pt uses UEs for support.      Balance Overall balance assessment: Needs assistance         Standing balance support: During functional activity Standing balance-Leahy Scale: Good                              ADL Overall ADL's : Needs assistance/impaired     Grooming: Supervision/safety;Standing   Upper Body Bathing: Supervision/ safety;Sitting   Lower Body Bathing: Supervison/ safety;Sit to/from stand   Upper Body Dressing : Supervision/safety;Sitting   Lower Body Dressing: Supervision/safety;Sit to/from stand   Toilet Transfer: Supervision/safety;Regular Archivist- Architect and Hygiene: Supervision/safety   Tub/ Engineer, structural: Min guard   Functional mobility during ADLs: Min guard General ADL Comments: Pt reports problem area being tub. Pt has a walk in shower. OT reccomended pt use the walk in shower and possibly get a grab bar.  Husband reports pt is not good at changing routines, but he will keep on her about using shower as tub does not have a shower head     Vision                      Extremity/Trunk Assessment Upper Extremity Assessment Upper Extremity Assessment: Generalized weakness   Lower Extremity Assessment Lower Extremity Assessment: Generalized weakness   Cervical / Trunk Assessment Cervical / Trunk Assessment: Normal   Communication Communication Communication: No difficulties   Cognition Arousal/Alertness: Awake/alert Behavior During Therapy: WFL for tasks assessed/performed Overall Cognitive Status: No family/caregiver present to determine baseline cognitive functioning                 General Comments: Husband present initially, but left shortly after PT arrived.  pt demos difficulty with multi-tasking, problem-solving, and path-finding.                Home Living Family/patient expects to be discharged to:: Private residence Living Arrangements: Spouse/significant other Available Help at Discharge: Family;Available 24 hours/day Type of Home: House Home Access: Level entry     Home Layout: Laundry or work area in basement               Home Equipment: None          Prior Functioning/Environment Level of Independence: Independent             OT Diagnosis: Generalized weakness   OT Problem List: Decreased strength;Decreased activity tolerance;Decreased safety awareness   OT Treatment/Interventions: Self-care/ADL training;DME and/or AE instruction;Patient/family education    OT Goals(Current goals  can be found in the care plan section) Acute Rehab OT Goals Patient Stated Goal: Home OT Goal Formulation: With patient Time For Goal Achievement: 09/03/13 Potential to Achieve Goals: Good ADL Goals Pt Will Transfer to Toilet: with modified independence;ambulating;regular height toilet Pt Will Perform Toileting - Clothing Manipulation and hygiene: with modified independence;sit to/from stand Pt Will Perform Tub/Shower Transfer: Tub transfer;with modified independence  OT Frequency: Min 2X/week              End  of Session    Activity Tolerance: Patient tolerated treatment well Patient left: in bed   Time: 1148-1210 OT Time Calculation (min): 22 min Charges:  OT General Charges $OT Visit: 1 Procedure OT Evaluation $Initial OT Evaluation Tier I: 1 Procedure OT Treatments $Self Care/Home Management : 8-22 mins G-Codes:    Alba CoryLorraine D Sondra Blixt 08/20/2013, 12:18 PM

## 2013-08-20 NOTE — Discharge Summary (Signed)
Physician Discharge Summary  Yolanda Hopkins UJW:[id number] DOB: Nov 28, 1940 DOA: 08/19/2013  PCP: Josue Hector, MD  Admit date: 08/19/2013 Discharge date: 08/20/2013  Time spent: 45 minutes  Recommendations for Outpatient Follow-up:  Patient will be discharged home.  She will also need to have physical therapy as an outpatient. Patient will need followup with her primary care physician within one week of discharge. She will also need follow up with Dr. Pearlean Brownie in 2 months. Patient needs to continue her medications as prescribed. She should have her hemoglobin A1c monitored as well as her lipid panel and liver panel.  Discharge Diagnoses:  Principal Problem:   Stroke Active Problems:   HTN (hypertension)   Tobacco abuse   Vertigo   Gait abnormality   CVA (cerebral infarction)   Discharge Condition: Stable  Diet recommendation: heart healthy  Filed Weights   08/19/13 0948 08/19/13 2040  Weight: 64.411 kg (142 lb) 65.862 kg (145 lb 3.2 oz)    History of present illness:  Yolanda Hopkins is a 73 y.o. female  With past medical history significant for vertigo and per patient recently elevated blood pressure who presents to the ED with a six-day history of dizziness and elevated blood pressure. Patient states that the dizziness has been intermittent and which was doing house chores would have to sit down when she felt tied and as on the dizziness with sitting. Patient says she had to use the banisters to support herself and they have abnormal gait. Patient denies any spinning sensation. Patient denies any fevers, no chills, no chest pain, no nausea, no vomiting, nondominant pain, no melena, no hematemesis, no hematochezia. Patient denies any numbness or tingling. Patient denies any asymmetric weakness. Patient denies any facial droop. Patient does endorse some generalized weakness to the point where she dropped a coffee this morning on the day of admission. Patient's husband said that  he checked her blood pressures on the day of admission and was elevated at 173/100 with a pulse rate of approximately 110. Patient also some complaints of right great toe pain.  Patient was seen in the emergency room CT of the head which was done was negative. MRI of the head which was done was consistent with an acute stroke. Basic metabolic profile is unremarkable. CBC was unremarkable. Urinalysis was unremarkable. EKG showed normal sinus rhythm and right atrial enlargement. Due to timing of presentation patient was deemed not to be a TPA candidate.  We were consulted to admit the patient for further evaluation and management.   Hospital Course:  Acute CVA  -CT of the head: No acute abnormality  -MRIMRA of the brain: Small, acute left lateral lenticulostriate infarct, no evidence of major intracranial arterial occlusion or significant proximal stenosis  -Echocardiogram: EF 65-70%  -Carotid doppler: Right - 1% to 39% ICA stenosis. Vertebral artery flow is antegrade/ Left - 40% to 59% ICA stenosis. Vertebral artery flow is antegrade -Lipid panel: 281/121/58/199  -Will start patient on statin at discharge as we do not have Crestor on formulary -Hemoglobin A1c pending  -PT evaluated the patient and recommended outpatient physical therapy -Occupational therapy evaluated patient and recommended home health OT -Neurology was consulted, patient will need to be placed on Crestor and aspirin. She will need to follow up with Dr. Pearlean Brownie in 2 months. -Continue aspirin for secondary stroke prophylaxis   Hypertension  -Allowing for permissive hypertension  -According to patient, she has no history of hypertension, currently not on any home medications   Tobacco abuse  -  Continue nicotine patch  -Cessation counseling   Gait abnormalities  -Likely secondary to CVA  -Pending physical therapy   Hyperlipidemia  -Lipid panel: 281/121/58/199  -Will start Crestor at  discharge  Procedures: Echocardiogram Study Conclusions Left ventricle: The cavity size was normal. Wall thickness was normal. Systolic function was vigorous. The estimated ejection fraction was in the range of 65% to 70%. Wall motion was normal; there were no regional wall motion abnormalities.   Carotid Doppler Preliminary report: Right - 1% to 39% ICA stenosis. Vertebral artery flow is antegrade/ Left - 40% to 59% ICA stenosis. Vertebral artery flow is antegrade  Consultations: Neurology  Discharge Exam: Filed Vitals:   08/20/13 1353  BP: 142/66  Pulse: 63  Temp: 98.4 F (36.9 C)  Resp: 20   Exam  General: Well developed, well nourished, NAD, appears stated age  HEENT: NCAT, PERRLA, EOMI, Anicteic Sclera, mucous membranes moist.  Neck: Supple, no JVD, no masses  Cardiovascular: S1 S2 auscultated, no rubs, murmurs or gallops. Regular rate and rhythm.  Respiratory: Clear to auscultation bilaterally with equal chest rise  Abdomen: Soft, nontender, nondistended, + bowel sounds  Extremities: warm dry without cyanosis clubbing or edema  Neuro: AAOx3, cranial nerves grossly intact. Strength 5/5 in patient's upper and lower extremities bilaterally  Skin: Without rashes exudates or nodules  Psych: Normal affect and demeanor   Discharge Instructions      Discharge Orders   Future Orders Complete By Expires   Consult to care management  As directed    Diet - low sodium heart healthy  As directed    Discharge instructions  As directed    Increase activity slowly  As directed        Medication List         aspirin 325 MG tablet  Take 1 tablet (325 mg total) by mouth daily.     diphenhydrAMINE 25 MG tablet  Commonly known as:  BENADRYL  Take 25 mg by mouth every 6 (six) hours as needed for allergies.     naproxen sodium 220 MG tablet  Commonly known as:  ANAPROX  Take 220 mg by mouth daily as needed (pain).     nicotine 14 mg/24hr patch  Commonly known as:  NICODERM  CQ - dosed in mg/24 hours  Place 1 patch (14 mg total) onto the skin daily.     rosuvastatin 10 MG tablet  Commonly known as:  CRESTOR  Take 1 tablet (10 mg total) by mouth daily.     VISINE OP  Place 2 drops into both eyes 2 (two) times daily as needed (dry eyes).       Allergies  Allergen Reactions  . Sulfur Nausea Only   Follow-up Information   Follow up with Josue Hector, MD. Schedule an appointment as soon as possible for a visit in 1 week. [facility] followup)    Specialty:  Family Medicine   Contact information:   [address] [place] [address] [phone]       Follow up with Gates Rigg, MD. Schedule an appointment as soon as possible for a visit in 2 months.   Specialties:  Neurology, Radiology   Contact information:   [address] [place] [address] [phone]        The results of significant diagnostics from this hospitalization (including imaging, microbiology, ancillary and laboratory) are listed below for reference.    Significant Diagnostic Studies: Dg Chest 2 View  08/19/2013   CLINICAL DATA:  Stroke  EXAM: CHEST  2 VIEW  COMPARISON:  None.  FINDINGS: The heart size and mediastinal contours are within normal limits. Both lungs are clear. The visualized skeletal structures are unremarkable.  IMPRESSION: No active cardiopulmonary disease.   Electronically Signed   By: Salome Holmes M.D.   On: 08/19/2013 11:28   Ct Head Wo Contrast  08/19/2013   CLINICAL DATA:  Abnormal gait  EXAM: CT HEAD WITHOUT CONTRAST  TECHNIQUE: Contiguous axial images were obtained from the base of the skull through the vertex without intravenous contrast.  COMPARISON:  None.  FINDINGS: No acute intracranial abnormality. Specifically, no hemorrhage, hydrocephalus, mass lesion, acute infarction, or significant intracranial injury. No acute calvarial abnormality. Age-appropriate atrophy. Scattered of mild areas of low-attenuation within the deep  white matter regions. Consistent with small vessel white matter ischaemia. Likely mucous retention cyst base of the right maxillary sinus. Areas of mucosal thickening and opacification appreciated within the ethmoid air cells and to a lesser extent the sphenoid sinuses. The mastoid air cells are patent. .  IMPRESSION: No acute intracranial abnormality. Chronic and involutional changes.  Sinus disease within the ethmoid air cells, sphenoid air cells.  Likely mucous retention cyst within the right maxillary sinus.   Electronically Signed   By: Salome Holmes M.D.   On: 08/19/2013 11:40   Mr Maxine Glenn Head Wo Contrast  08/19/2013   CLINICAL DATA:  Dizziness.  Cerebellar stroke versus vertigo.  EXAM: MRI HEAD WITHOUT CONTRAST  MRA HEAD WITHOUT CONTRAST  TECHNIQUE: Multiplanar, multiecho pulse sequences of the brain and surrounding structures were obtained without intravenous contrast. Angiographic images of the head were obtained using MRA technique without contrast.  COMPARISON:  Head CT 08/19/2013  FINDINGS: MRI HEAD FINDINGS  Incidental note is made of a partially empty sella. There are several small foci of acute infarction in the left lateral lenticulostriate territory involving the corona radiata and lentiform nucleus. Small, scattered foci of T2 hyperintensity are present throughout the subcortical and deep cerebral white matter bilaterally, nonspecific but compatible with mild chronic small vessel ischemic disease. There is mild-to-moderate generalized cerebral atrophy. There is no evidence of mass, midline shift, or extra-axial fluid collection.  Orbits are unremarkable. Right maxillary sinus mucous retention cyst is noted. There is mild bilateral ethmoid and left sphenoid sinus mucosal thickening. Mastoid air cells are clear. Major intracranial vascular flow voids are preserved.  MRA HEAD FINDINGS  Visualized distal vertebral arteries are patent and codominant. PICA origins are patent bilaterally. AICA origins are  patent. Basilar artery is patent without stenosis. The left P1 segment is hypoplastic, and there is a prominent left posterior communicating artery. PCAs are otherwise unremarkable.  Internal carotid arteries are patent from skullbase to carotid termini. Symmetrically diminished signal within the anterior cavernous carotids bilaterally is likely artifactual. The left supraclinoid ICA is slightly smaller in caliber than the right without focal stenosis. The left A1 segment is markedly hypoplastic, with the majority of flow in the left ACA being supplied from the right via the anterior communicating artery. ACAs are otherwise unremarkable. MCAs are unremarkable. No intracranial aneurysm is identified.  IMPRESSION: 1. Small, acute left lateral lenticulostriate infarct. 2. 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 3. No evidence of major intracranial arterial occlusion or significant proximal stenosis.   Electronically Signed   By: Sebastian Ache   On: 08/19/2013 13:54   Mr Brain Wo Contrast  08/19/2013   CLINICAL DATA:  Dizziness.  Cerebellar stroke versus vertigo.  EXAM:  MRI HEAD WITHOUT CONTRAST  MRA HEAD WITHOUT CONTRAST  TECHNIQUE: Multiplanar, multiecho pulse sequences of the brain and surrounding structures were obtained without intravenous contrast. Angiographic images of the head were obtained using MRA technique without contrast.  COMPARISON:  Head CT 08/19/2013  FINDINGS: MRI HEAD FINDINGS  Incidental note is made of a partially empty sella. There are several small foci of acute infarction in the left lateral lenticulostriate territory involving the corona radiata and lentiform nucleus. Small, scattered foci of T2 hyperintensity are present throughout the subcortical and deep cerebral white matter bilaterally, nonspecific but compatible with mild chronic small vessel ischemic disease. There is mild-to-moderate generalized cerebral atrophy. There is no evidence of mass, midline shift, or  extra-axial fluid collection.  Orbits are unremarkable. Right maxillary sinus mucous retention cyst is noted. There is mild bilateral ethmoid and left sphenoid sinus mucosal thickening. Mastoid air cells are clear. Major intracranial vascular flow voids are preserved.  MRA HEAD FINDINGS  Visualized distal vertebral arteries are patent and codominant. PICA origins are patent bilaterally. AICA origins are patent. Basilar artery is patent without stenosis. The left P1 segment is hypoplastic, and there is a prominent left posterior communicating artery. PCAs are otherwise unremarkable.  Internal carotid arteries are patent from skullbase to carotid termini. Symmetrically diminished signal within the anterior cavernous carotids bilaterally is likely artifactual. The left supraclinoid ICA is slightly smaller in caliber than the right without focal stenosis. The left A1 segment is markedly hypoplastic, with the majority of flow in the left ACA being supplied from the right via the anterior communicating artery. ACAs are otherwise unremarkable. MCAs are unremarkable. No intracranial aneurysm is identified.  IMPRESSION: 1. Small, acute left lateral lenticulostriate infarct. 2. 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 3. No evidence of major intracranial arterial occlusion or significant proximal stenosis.   Electronically Signed   By: Sebastian AcheAllen  Grady   On: 08/19/2013 13:54    Microbiology: No results found for this or any previous visit (from the past 240 hour(s)).   Labs: Basic Metabolic Panel:  Recent Labs Lab 08/19/13 0949 08/20/13 0645  NA 144 144  K 4.8 4.2  CL 102 105  CO2 27 26  GLUCOSE 111* 94  BUN 10 12  CREATININE 0.75 0.74  CALCIUM 10.4 9.7   Liver Function Tests: No results found for this basename: AST, ALT, ALKPHOS, BILITOT, PROT, ALBUMIN,  in the last 168 hours No results found for this basename: LIPASE, AMYLASE,  in the last 168 hours No results found for this  basename: AMMONIA,  in the last 168 hours CBC:  Recent Labs Lab 08/19/13 0949 08/20/13 0645  WBC 6.2 6.0  HGB 16.0* 14.2  HCT 45.7 41.2  MCV 94.8 94.3  PLT 300 255   Cardiac Enzymes: No results found for this basename: CKTOTAL, CKMB, CKMBINDEX, TROPONINI,  in the last 168 hours BNP: BNP (last 3 results) No results found for this basename: PROBNP,  in the last 8760 hours CBG: No results found for this basename: GLUCAP,  in the last 168 hours     Signed:  Edsel PetrinMaryann Sarvesh Meddaugh  [facility] 08/20/2013, 3:39 PM

## 2013-08-20 NOTE — Progress Notes (Signed)
[facility]                                                                              Patient Demographics  Yolanda Hopkins, is a 73 y.o. female, DOB - 01/26/1941, [id number]  Admit date - 08/19/2013   Admitting Physician Rodolph Bonganiel V Thompson, MD  Outpatient Primary MD for the patient is Josue HectorNYLAND,LEONARD ROBERT, MD  LOS - 1   Chief Complaint  Patient presents with  . Dizziness       Assessment & Plan   Acute CVA -CT of the head: No acute abnormality -MRIMRA of the brain: Small, acute left lateral lenticulostriate infarct, no evidence of major intracranial arterial occlusion or significant proximal stenosis -Pending carotid Doppler, echocardiogram -Lipid panel: 281/121/58/199 -Will start patient on statin -Hemoglobin A1c pending -PT and OT and speech therapy a been consulted for evaluation and treatment -Neurology also consulted for further evaluation and management -Continue aspirin for secondary stroke prophylaxis  Hypertension -Allowing for permissive hypertension -According to patient, she has no history of hypertension, currently not on any home medications  Tobacco abuse -Continue nicotine patch -Cessation counseling  Gait abnormalities -Likely secondary to CVA -Pending physical therapy  Hyperlipidemia -Lipid panel: 281/121/58/199 -Will start atorvastatin  Code Status: Full  Family Communication: None at bedside  Disposition Plan: Admitted  Time Spent in minutes   30 minutes  Procedures none  Consults  neurology  DVT Prophylaxis  Lovenox   Lab Results  Component Value Date   PLT 255 08/20/2013    Medications  Scheduled Meds: . aspirin  300 mg Rectal Daily   Or  . aspirin  325 mg Oral Daily  . enoxaparin (LOVENOX) injection  40 mg Subcutaneous Q24H   Continuous Infusions: . sodium chloride 75 mL/hr at 08/19/13 1600   PRN Meds:.acetaminophen, acetaminophen, naproxen, ondansetron, senna-docusate, tetrahydrozoline  Antibiotics     Anti-infectives   None        Subjective:   Yolanda Hopkins seen and examined today.  Patient has no complaints this morning. Denies any dizziness at this time.  States she does not have any chest pain, headache, shortness of breath, abdominal pain.  Objective:   Filed Vitals:   08/19/13 2359 08/20/13 0214 08/20/13 0432 08/20/13 0626  BP: 125/61 105/47 118/53 121/57  Pulse: 63 64 70 66  Temp: 97.8 F (36.6 C) 97.4 F (36.3 C) 97.9 F (36.6 C) 98.3 F (36.8 C)  TempSrc: Oral Oral Oral Oral  Resp:  18 18 16   Height:      Weight:      SpO2: 97% 97% 96% 97%    Wt Readings from Last 3 Encounters:  08/19/13 65.862 kg (145 lb 3.2 oz)    No intake or output data in the 24 hours ending 08/20/13 0818  Exam  General: Well developed, well nourished, NAD, appears stated age  HEENT: NCAT, PERRLA, EOMI, Anicteic Sclera, mucous membranes moist.   Neck: Supple, no JVD, no masses  Cardiovascular: S1 S2 auscultated, no rubs, murmurs or gallops. Regular rate and rhythm.  Respiratory: Clear to auscultation bilaterally with equal chest rise  Abdomen: Soft, nontender, nondistended, + bowel sounds  Extremities: warm dry without cyanosis clubbing or edema  Neuro: AAOx3, cranial nerves grossly intact. Strength 5/5 in patient's upper and lower extremities bilaterally  Skin: Without rashes exudates or nodules  Psych: Normal affect and demeanor   Data Review   Micro Results No results found for this or any previous visit (from the past 240 hour(s)).  Radiology Reports Dg Chest 2 View  08/19/2013   CLINICAL DATA:  Stroke  EXAM: CHEST  2 VIEW  COMPARISON:  None.  FINDINGS: The heart size and mediastinal contours are within normal limits. Both lungs are clear. The visualized skeletal structures are unremarkable.  IMPRESSION: No active cardiopulmonary disease.   Electronically Signed   By: Salome Holmes M.D.   On: 08/19/2013 11:28   Ct Head Wo Contrast  08/19/2013   CLINICAL DATA:   Abnormal gait  EXAM: CT HEAD WITHOUT CONTRAST  TECHNIQUE: Contiguous axial images were obtained from the base of the skull through the vertex without intravenous contrast.  COMPARISON:  None.  FINDINGS: No acute intracranial abnormality. Specifically, no hemorrhage, hydrocephalus, mass lesion, acute infarction, or significant intracranial injury. No acute calvarial abnormality. Age-appropriate atrophy. Scattered of mild areas of low-attenuation within the deep white matter regions. Consistent with small vessel white matter ischaemia. Likely mucous retention cyst base of the right maxillary sinus. Areas of mucosal thickening and opacification appreciated within the ethmoid air cells and to a lesser extent the sphenoid sinuses. The mastoid air cells are patent. .  IMPRESSION: No acute intracranial abnormality. Chronic and involutional changes.  Sinus disease within the ethmoid air cells, sphenoid air cells.  Likely mucous retention cyst within the right maxillary sinus.   Electronically Signed   By: Salome Holmes M.D.   On: 08/19/2013 11:40   Mr Maxine Glenn Head Wo Contrast  08/19/2013   CLINICAL DATA:  Dizziness.  Cerebellar stroke versus vertigo.  EXAM: MRI HEAD WITHOUT CONTRAST  MRA HEAD WITHOUT CONTRAST  TECHNIQUE: Multiplanar, multiecho pulse sequences of the brain and surrounding structures were obtained without intravenous contrast. Angiographic images of the head were obtained using MRA technique without contrast.  COMPARISON:  Head CT 08/19/2013  FINDINGS: MRI HEAD FINDINGS  Incidental note is made of a partially empty sella. There are several small foci of acute infarction in the left lateral lenticulostriate territory involving the corona radiata and lentiform nucleus. Small, scattered foci of T2 hyperintensity are present throughout the subcortical and deep cerebral white matter bilaterally, nonspecific but compatible with mild chronic small vessel ischemic disease. There is mild-to-moderate generalized  cerebral atrophy. There is no evidence of mass, midline shift, or extra-axial fluid collection.  Orbits are unremarkable. Right maxillary sinus mucous retention cyst is noted. There is mild bilateral ethmoid and left sphenoid sinus mucosal thickening. Mastoid air cells are clear. Major intracranial vascular flow voids are preserved.  MRA HEAD FINDINGS  Visualized distal vertebral arteries are patent and codominant. PICA origins are patent bilaterally. AICA origins are patent. Basilar artery is patent without stenosis. The left P1 segment is hypoplastic, and there is a prominent left posterior communicating artery. PCAs are otherwise unremarkable.  Internal carotid arteries are patent from skullbase to carotid termini. Symmetrically diminished signal within the anterior cavernous carotids bilaterally is likely artifactual. The left supraclinoid ICA is slightly smaller in caliber than the right without focal stenosis. The left A1 segment is markedly hypoplastic, with the majority of flow in the left ACA being supplied from the right via the anterior communicating artery. ACAs are otherwise unremarkable. MCAs are unremarkable. No intracranial aneurysm is identified.  IMPRESSION: 1.  Small, acute left lateral lenticulostriate infarct. 2. 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 3. No evidence of major intracranial arterial occlusion or significant proximal stenosis.   Electronically Signed   By: Sebastian Ache   On: 08/19/2013 13:54   Mr Brain Wo Contrast  08/19/2013   CLINICAL DATA:  Dizziness.  Cerebellar stroke versus vertigo.  EXAM: MRI HEAD WITHOUT CONTRAST  MRA HEAD WITHOUT CONTRAST  TECHNIQUE: Multiplanar, multiecho pulse sequences of the brain and surrounding structures were obtained without intravenous contrast. Angiographic images of the head were obtained using MRA technique without contrast.  COMPARISON:  Head CT 08/19/2013  FINDINGS: MRI HEAD FINDINGS  Incidental note is made of a  partially empty sella. There are several small foci of acute infarction in the left lateral lenticulostriate territory involving the corona radiata and lentiform nucleus. Small, scattered foci of T2 hyperintensity are present throughout the subcortical and deep cerebral white matter bilaterally, nonspecific but compatible with mild chronic small vessel ischemic disease. There is mild-to-moderate generalized cerebral atrophy. There is no evidence of mass, midline shift, or extra-axial fluid collection.  Orbits are unremarkable. Right maxillary sinus mucous retention cyst is noted. There is mild bilateral ethmoid and left sphenoid sinus mucosal thickening. Mastoid air cells are clear. Major intracranial vascular flow voids are preserved.  MRA HEAD FINDINGS  Visualized distal vertebral arteries are patent and codominant. PICA origins are patent bilaterally. AICA origins are patent. Basilar artery is patent without stenosis. The left P1 segment is hypoplastic, and there is a prominent left posterior communicating artery. PCAs are otherwise unremarkable.  Internal carotid arteries are patent from skullbase to carotid termini. Symmetrically diminished signal within the anterior cavernous carotids bilaterally is likely artifactual. The left supraclinoid ICA is slightly smaller in caliber than the right without focal stenosis. The left A1 segment is markedly hypoplastic, with the majority of flow in the left ACA being supplied from the right via the anterior communicating artery. ACAs are otherwise unremarkable. MCAs are unremarkable. No intracranial aneurysm is identified.  IMPRESSION: 1. Small, acute left lateral lenticulostriate infarct. 2. 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 3. No evidence of major intracranial arterial occlusion or significant proximal stenosis.   Electronically Signed   By: Sebastian Ache   On: 08/19/2013 13:54    CBC  Recent Labs Lab 08/19/13 0949 08/20/13 0645    WBC 6.2 6.0  HGB 16.0* 14.2  HCT 45.7 41.2  PLT 300 255  MCV 94.8 94.3  MCH 33.2 32.5  MCHC 35.0 34.5  RDW 12.7 12.6    Chemistries   Recent Labs Lab 08/19/13 0949 08/20/13 0645  NA 144 144  K 4.8 4.2  CL 102 105  CO2 27 26  GLUCOSE 111* 94  BUN 10 12  CREATININE 0.75 0.74  CALCIUM 10.4 9.7   ------------------------------------------------------------------------------------------------------------------ estimated creatinine clearance is 57.7 ml/min (by C-G formula based on Cr of 0.74). ------------------------------------------------------------------------------------------------------------------ No results found for this basename: HGBA1C,  in the last 72 hours ------------------------------------------------------------------------------------------------------------------  Recent Labs  08/20/13 0645  CHOL 281*  HDL 58  LDLCALC 199*  TRIG 121  CHOLHDL 4.8   ------------------------------------------------------------------------------------------------------------------ No results found for this basename: TSH, T4TOTAL, FREET3, T3FREE, THYROIDAB,  in the last 72 hours ------------------------------------------------------------------------------------------------------------------ No results found for this basename: VITAMINB12, FOLATE, FERRITIN, TIBC, IRON, RETICCTPCT,  in the last 72 hours  Coagulation profile No results found for this basename: INR, PROTIME,  in the last 168 hours  No results found for this  basename: DDIMER,  in the last 72 hours  Cardiac Enzymes No results found for this basename: CK, CKMB, TROPONINI, MYOGLOBIN,  in the last 168 hours ------------------------------------------------------------------------------------------------------------------ No components found with this basename: POCBNP,     Eann Cleland D.O. on 08/20/2013 at 8:18 AM  Between 7am to 7pm - Pager - [phone]  After 7pm go to [email or web address] - password  [email or web address]  And look for the night coverage person covering for me after hours  [facility] Office  [phone]

## 2013-08-20 NOTE — Evaluation (Signed)
Physical Therapy Evaluation Patient Details Name: Yolanda Hopkins MRN: [id number] DOB: 1940-12-18 Today's Date: 08/20/2013   History of Present Illness  pt presents with L Lateral Lenticulostriate Infarct.    Clinical Impression  Pt moving well, but mildly unsteady and worse with balance challenges.  Pt seeming to have difficulty with multi-tasking and problem solving, especially during mobility.  Husband left early on in PT, so unable to confirm if this is pt's baseline cognition.  Would benefit from OPPT for high level balance.  Will continue to follow.      Follow Up Recommendations Outpatient PT    Equipment Recommendations  None recommended by PT    Recommendations for Other Services       Precautions / Restrictions Precautions Precautions: Fall Restrictions Weight Bearing Restrictions: No      Mobility  Bed Mobility Overal bed mobility: Modified Independent                Transfers Overall transfer level: Needs assistance Equipment used: None Transfers: Sit to/from Stand Sit to Stand: Supervision         General transfer comment: pt uses UEs for support.    Ambulation/Gait Ambulation/Gait assistance: Supervision Ambulation Distance (Feet): 500 Feet Assistive device: None Gait Pattern/deviations: Step-through pattern;Decreased stride length     General Gait Details: pt states she is moving slower than normal.  pt  with mild balance deficits when presented with challenges.  Attempted DGI during ambulation, however pt having trouble following directions while amb.  pt either had to stop amb to talk to PT.    Stairs Stairs: Yes Stairs assistance: Supervision Stair Management: One rail Right;Forwards;Alternating pattern Number of Stairs: 12    Wheelchair Mobility    Modified Rankin (Stroke Patients Only)       Balance Overall balance assessment: Needs assistance         Standing balance support: During functional activity Standing  balance-Leahy Scale: Good                               Pertinent Vitals/Pain Denied pain.      Home Living Family/patient expects to be discharged to:: Private residence Living Arrangements: Spouse/significant other Available Help at Discharge: Family;Available 24 hours/day Type of Home: House Home Access: Level entry     Home Layout: Laundry or work area in basement Home Equipment: None      Prior Function Level of Independence: Independent               Higher education careers adviser        Extremity/Trunk Assessment   Upper Extremity Assessment: Defer to OT evaluation           Lower Extremity Assessment: Generalized weakness      Cervical / Trunk Assessment: Normal  Communication   Communication: No difficulties  Cognition Arousal/Alertness: Awake/alert Behavior During Therapy: WFL for tasks assessed/performed Overall Cognitive Status: No family/caregiver present to determine baseline cognitive functioning                 General Comments: Husband present initially, but left shortly after PT arrived.  pt demos difficulty with multi-tasking, problem-solving, and path-finding.      General Comments      Exercises        Assessment/Plan    PT Assessment Patient needs continued PT services  PT Diagnosis Difficulty walking   PT Problem List Decreased activity tolerance;Decreased balance;Decreased mobility;Decreased cognition  PT Treatment  Interventions DME instruction;Gait training;Stair training;Functional mobility training;Therapeutic activities;Therapeutic exercise;Balance training;Neuromuscular re-education;Cognitive remediation;Patient/family education   PT Goals (Current goals can be found in the Care Plan section) Acute Rehab PT Goals Patient Stated Goal: Home PT Goal Formulation: With patient Time For Goal Achievement: 08/27/13 Potential to Achieve Goals: Good    Frequency Min 4X/week   Barriers to discharge         Co-evaluation               End of Session Equipment Utilized During Treatment: Gait belt Activity Tolerance: Patient tolerated treatment well Patient left: in bed;with call bell/phone within reach;with bed alarm set Nurse Communication: Mobility status         Time: [id number] PT Time Calculation (min): 23 min   Charges:   PT Evaluation $Initial PT Evaluation Tier I: 1 Procedure PT Treatments $Gait Training: 8-22 mins   PT G CodesSunny Schlein:          Kaylin Marcon F Etoile Looman, PT [phone] 08/20/2013, 11:49 AM

## 2013-08-20 NOTE — Progress Notes (Signed)
VASCULAR LAB PRELIMINARY  PRELIMINARY  PRELIMINARY  PRELIMINARY  Carotid duplex completed.    Preliminary report:  Right - 1% to 39% ICA stenosis. Vertebral artery flow is antegrade/ Left - 40% to 59% ICA stenosis. Vertebral artery flow is antegrade.  [place] D Locklyn Henriquez, RVS 08/20/2013, 3:21 PM

## 2013-08-20 NOTE — Progress Notes (Signed)
Stroke Team Progress Note  HISTORY Yolanda Hopkins is an 73 y.o. female who has been diagnosed with vertigo "by her dentist" a long time ago. For the last two week she has been having a sensation like she was going to have vertigo but she would shut her eyes and pray --the sensation would pass. Today she woke up feeling generally fatigued. She took a bath and told her husband. He decided to bring her to hospital. She states she felt as though she was going to have vertigo but never actually had the sensation. Other than feeling generally weak, she denies any unilateral weakness, visual problem, dysarthric, dysphagia, sensory abnormalities. Of note--she feels ASA in the past has caused her to have kidney stones. She currently is not on ASA.  Date last known well: Date: 08/19/2013  Time last known well: Time: 20:00  tPA Given: No: out of window and minimal symptoms     She was admitted to the neurofloor for further evaluation and treatment.  SUBJECTIVE Her  family is at the bedside.  Overall she feels her condition is unchanged OBJECTIVE Most recent Vital Signs: Filed Vitals:   08/20/13 0432 08/20/13 0626 08/20/13 1019 08/20/13 1353  BP: 118/53 121/57 161/92 142/66  Pulse: 70 66 109 63  Temp: 97.9 F (36.6 C) 98.3 F (36.8 C) 98.6 F (37 C) 98.4 F (36.9 C)  TempSrc: Oral Oral Oral Oral  Resp: 18 16 18 20   Height:      Weight:      SpO2: 96% 97% 100% 96%   CBG (last 3)  No results found for this basename: GLUCAP,  in the last 72 hours  IV Fluid Intake:   . sodium chloride 75 mL/hr at 08/19/13 1600    MEDICATIONS  . aspirin  300 mg Rectal Daily   Or  . aspirin  325 mg Oral Daily  . atorvastatin  40 mg Oral q1800  . enoxaparin (LOVENOX) injection  40 mg Subcutaneous Q24H  . nicotine  14 mg Transdermal Daily   PRN:  acetaminophen, acetaminophen, naproxen, ondansetron, senna-docusate, tetrahydrozoline  Diet:  Cardiac   Activity:    Up with assistance  DVT Prophylaxis:   lovenox CLINICALLY SIGNIFICANT STUDIES Basic Metabolic Panel:  Recent Labs Lab 08/19/13 0949 08/20/13 0645  NA 144 144  K 4.8 4.2  CL 102 105  CO2 27 26  GLUCOSE 111* 94  BUN 10 12  CREATININE 0.75 0.74  CALCIUM 10.4 9.7   Liver Function Tests: No results found for this basename: AST, ALT, ALKPHOS, BILITOT, PROT, ALBUMIN,  in the last 168 hours CBC:  Recent Labs Lab 08/19/13 0949 08/20/13 0645  WBC 6.2 6.0  HGB 16.0* 14.2  HCT 45.7 41.2  MCV 94.8 94.3  PLT 300 255   Coagulation: No results found for this basename: LABPROT, INR,  in the last 168 hours Cardiac Enzymes: No results found for this basename: CKTOTAL, CKMB, CKMBINDEX, TROPONINI,  in the last 168 hours Urinalysis:  Recent Labs Lab 08/19/13 1055  COLORURINE YELLOW  LABSPEC 1.010  PHURINE 7.5  GLUCOSEU NEGATIVE  HGBUR NEGATIVE  BILIRUBINUR NEGATIVE  KETONESUR NEGATIVE  PROTEINUR NEGATIVE  UROBILINOGEN 0.2  NITRITE NEGATIVE  LEUKOCYTESUR NEGATIVE   Lipid Panel    Component Value Date/Time   CHOL 281* 08/20/2013 0645   TRIG 121 08/20/2013 0645   HDL 58 08/20/2013 0645   CHOLHDL 4.8 08/20/2013 0645   VLDL 24 08/20/2013 0645   LDLCALC 199* 08/20/2013 0645   HgbA1C  Lab Results  Component Value Date   HGBA1C 5.3 08/20/2013    Urine Drug Screen:   No results found for this basename: labopia, cocainscrnur, labbenz, amphetmu, thcu, labbarb    Alcohol Level: No results found for this basename: ETH,  in the last 168 hours  Dg Chest 2 View  08/19/2013   CLINICAL DATA:  Stroke  EXAM: CHEST  2 VIEW  COMPARISON:  None.  FINDINGS: The heart size and mediastinal contours are within normal limits. Both lungs are clear. The visualized skeletal structures are unremarkable.  IMPRESSION: No active cardiopulmonary disease.   Electronically Signed   By: Salome HolmesHector  Cooper M.D.   On: 08/19/2013 11:28   Ct Head Wo Contrast  08/19/2013   CLINICAL DATA:  Abnormal gait  EXAM: CT HEAD WITHOUT CONTRAST  TECHNIQUE: Contiguous axial  images were obtained from the base of the skull through the vertex without intravenous contrast.  COMPARISON:  None.  FINDINGS: No acute intracranial abnormality. Specifically, no hemorrhage, hydrocephalus, mass lesion, acute infarction, or significant intracranial injury. No acute calvarial abnormality. Age-appropriate atrophy. Scattered of mild areas of low-attenuation within the deep white matter regions. Consistent with small vessel white matter ischaemia. Likely mucous retention cyst base of the right maxillary sinus. Areas of mucosal thickening and opacification appreciated within the ethmoid air cells and to a lesser extent the sphenoid sinuses. The mastoid air cells are patent. .  IMPRESSION: No acute intracranial abnormality. Chronic and involutional changes.  Sinus disease within the ethmoid air cells, sphenoid air cells.  Likely mucous retention cyst within the right maxillary sinus.   Electronically Signed   By: Salome HolmesHector  Cooper M.D.   On: 08/19/2013 11:40   Mr Maxine GlennMra Head Wo Contrast  08/19/2013   CLINICAL DATA:  Dizziness.  Cerebellar stroke versus vertigo.  EXAM: MRI HEAD WITHOUT CONTRAST  MRA HEAD WITHOUT CONTRAST  TECHNIQUE: Multiplanar, multiecho pulse sequences of the brain and surrounding structures were obtained without intravenous contrast. Angiographic images of the head were obtained using MRA technique without contrast.  COMPARISON:  Head CT 08/19/2013  FINDINGS: MRI HEAD FINDINGS  Incidental note is made of a partially empty sella. There are several small foci of acute infarction in the left lateral lenticulostriate territory involving the corona radiata and lentiform nucleus. Small, scattered foci of T2 hyperintensity are present throughout the subcortical and deep cerebral white matter bilaterally, nonspecific but compatible with mild chronic small vessel ischemic disease. There is mild-to-moderate generalized cerebral atrophy. There is no evidence of mass, midline shift, or extra-axial  fluid collection.  Orbits are unremarkable. Right maxillary sinus mucous retention cyst is noted. There is mild bilateral ethmoid and left sphenoid sinus mucosal thickening. Mastoid air cells are clear. Major intracranial vascular flow voids are preserved.  MRA HEAD FINDINGS  Visualized distal vertebral arteries are patent and codominant. PICA origins are patent bilaterally. AICA origins are patent. Basilar artery is patent without stenosis. The left P1 segment is hypoplastic, and there is a prominent left posterior communicating artery. PCAs are otherwise unremarkable.  Internal carotid arteries are patent from skullbase to carotid termini. Symmetrically diminished signal within the anterior cavernous carotids bilaterally is likely artifactual. The left supraclinoid ICA is slightly smaller in caliber than the right without focal stenosis. The left A1 segment is markedly hypoplastic, with the majority of flow in the left ACA being supplied from the right via the anterior communicating artery. ACAs are otherwise unremarkable. MCAs are unremarkable. No intracranial aneurysm is identified.  IMPRESSION: 1. Small, acute left  lateral lenticulostriate infarct. 2. 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 3. No evidence of major intracranial arterial occlusion or significant proximal stenosis.   Electronically Signed   By: Sebastian Ache   On: 08/19/2013 13:54   Mr Brain Wo Contrast  08/19/2013   CLINICAL DATA:  Dizziness.  Cerebellar stroke versus vertigo.  EXAM: MRI HEAD WITHOUT CONTRAST  MRA HEAD WITHOUT CONTRAST  TECHNIQUE: Multiplanar, multiecho pulse sequences of the brain and surrounding structures were obtained without intravenous contrast. Angiographic images of the head were obtained using MRA technique without contrast.  COMPARISON:  Head CT 08/19/2013  FINDINGS: MRI HEAD FINDINGS  Incidental note is made of a partially empty sella. There are several small foci of acute infarction in the  left lateral lenticulostriate territory involving the corona radiata and lentiform nucleus. Small, scattered foci of T2 hyperintensity are present throughout the subcortical and deep cerebral white matter bilaterally, nonspecific but compatible with mild chronic small vessel ischemic disease. There is mild-to-moderate generalized cerebral atrophy. There is no evidence of mass, midline shift, or extra-axial fluid collection.  Orbits are unremarkable. Right maxillary sinus mucous retention cyst is noted. There is mild bilateral ethmoid and left sphenoid sinus mucosal thickening. Mastoid air cells are clear. Major intracranial vascular flow voids are preserved.  MRA HEAD FINDINGS  Visualized distal vertebral arteries are patent and codominant. PICA origins are patent bilaterally. AICA origins are patent. Basilar artery is patent without stenosis. The left P1 segment is hypoplastic, and there is a prominent left posterior communicating artery. PCAs are otherwise unremarkable.  Internal carotid arteries are patent from skullbase to carotid termini. Symmetrically diminished signal within the anterior cavernous carotids bilaterally is likely artifactual. The left supraclinoid ICA is slightly smaller in caliber than the right without focal stenosis. The left A1 segment is markedly hypoplastic, with the majority of flow in the left ACA being supplied from the right via the anterior communicating artery. ACAs are otherwise unremarkable. MCAs are unremarkable. No intracranial aneurysm is identified.  IMPRESSION: 1. Small, acute left lateral lenticulostriate infarct. 2. 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 3. No evidence of major intracranial arterial occlusion or significant proximal stenosis.   Electronically Signed   By: Sebastian Ache   On: 08/19/2013 13:54      2D Echocardiogram  Left ventricle: The cavity size was normal. Wall thickness was normal. Systolic function was vigorous. The  estimated ejection fraction was in the range of 65% to 70%. Wall motion was normal; there were no regional wall motion abnormalities.   Carotid Doppler  Right - 1% to 39% ICA stenosis. Vertebral artery flow is antegrade/ Left - 40% to 59% ICA stenosis. Vertebral artery flow is antegrade.  CXR 08/19/13 No active cardiopulmonary disease.   EKG Normal sinus rhythm Right atrial enlargement  Therapy Recommendations outpatient therapies  Physical Exam  Pleasant elderly lady not in distress.Awake alert. Afebrile. Head is nontraumatic. Neck is supple without bruit. Hearing is normal. Cardiac exam no murmur or gallop. Lungs are clear to auscultation. Distal pulses are well felt. Mental Status:  Alert, oriented, thought content appropriate. Speech fluent without evidence of aphasia. Able to follow 3 step commands without difficulty.  Cranial Nerves:  II: Discs flat bilaterally; Visual fields grossly normal, pupils equal, round, reactive to light and accommodation  III,IV, VI: ptosis not present, extra-ocular motions intact bilaterally  V,VII: smile asymmetric on the left, facial light touch sensation normal bilaterally  VIII: hearing normal bilaterally  IX,X: gag  reflex present  XI: bilateral shoulder shrug  XII: midline tongue extension without atrophy or fasciculations  Motor:  Right : Upper extremity 5/5 Left: Upper extremity 5/5  Lower extremity 5/5 Lower extremity 5/5  --slight pronator drift on the right arm and slight drift on the right leg  Tone and bulk:normal tone throughout; no atrophy noted  Sensory: Pinprick and light touch intact throughout, bilaterally  Deep Tendon Reflexes:  Right: Upper Extremity Left: Upper extremity  biceps (C-5 to C-6) 2/4 biceps (C-5 to C-6) 2/4  tricep (C7) 2/4 triceps (C7) 2/4  Brachioradialis (C6) 2/4 Brachioradialis (C6) 2/4  Lower Extremity Lower Extremity  quadriceps (L-2 to L-4) 2/4 quadriceps (L-2 to L-4) 2/4  Achilles (S1) 1/4 Achilles (S1) 1/4   Plantars:  Right: downgoing Left: downgoing  Cerebellar:  normal finger-to-nose, normal heel-to-shin test  Gait: Unable to test  CV: pulses palpable throughout   ASSESSMENT Ms. Yolanda Hopkins is a 73 y.o. female presenting with  Vertigo and dizziness  . 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 Infarct felt to be thrombotic  secondary to small vessel disease  On no prior to admission. Now on aspirin 325 mg orally every day for secondary stroke prevention. Patient with resultant right facial droop. Stroke work up underway.      LDL 199. H/o statin intolerance in past      Hospital day # 1  TREATMENT/PLAN  Continue   aspirin 325 mg orally every day for secondary stroke prevention.  Start Crestor 10 mg daily if can be obtained from pharmacy and add CoQ 10 to decrease statin myalgias. Delia Heady, MD  08/20/2013 4:12 PM     To contact Stroke Continuity provider, please refer to [email or web address]. After hours, contact General Neurology

## 2013-08-20 NOTE — Progress Notes (Signed)
Echo Lab  2D Echocardiogram completed.  Yolanda Hopkins, RDCS 08/20/2013 9:49 AM

## 2013-08-20 NOTE — Progress Notes (Signed)
   CARE MANAGEMENT NOTE 08/20/2013  Patient:  Yolanda Hopkins,Yolanda Hopkins   Account Number:  [id number]  Date Initiated:  08/20/2013  Documentation initiated by:  Jiles CrockerHANDLER,Yolanda Hopkins  Subjective/Objective Assessment:   ADMITTED WITH STROKE     Action/Plan:   CM FOLLOWING FOR DCP   Anticipated DC Date:  08/25/2013   Anticipated DC Plan:  AWAITING ON PT/OT EVALS FOR DISPOSITION NEEDS WITH ATTENDING MD IN AGREEMENT   DC Planning Services  CM consult          Status of service:  In process, will continue to follow Medicare Important Message given?  NA - LOS <3 / Initial given by admissions (If response is "NO", the following Medicare IM given date fields will be blank)  Per UR Regulation:  Reviewed for med. necessity/level of care/duration of stay  Comments:  4/10/2015Abelino Derrick- B Torez Beauregard RN,BSN,MHA [phone]

## 2013-08-20 NOTE — Progress Notes (Signed)
Talked to patient with spouse present about DCP/ outpatient physical therapy; patient does not want this at this time; CM informed patient that if she changed her mind after hospitalization her PCP can make arrangements for outpatient therapy; Alexis GoodellB Kimbra Marcelino RN,BSN,MHA [phone]

## 2013-08-20 NOTE — Discharge Instructions (Signed)

## 2013-08-30 DIAGNOSIS — E785 Hyperlipidemia, unspecified: Secondary | ICD-10-CM | POA: Insufficient documentation

## 2016-05-21 ENCOUNTER — Inpatient Hospital Stay (HOSPITAL_COMMUNITY)
Admission: AD | Admit: 2016-05-21 | Discharge: 2016-05-24 | DRG: 379 | Disposition: A | Discharge status: Home / Self Care | Payer: Medicare Other | Source: Other Acute Inpatient Hospital | Attending: Internal Medicine | Admitting: Internal Medicine

## 2016-05-21 DIAGNOSIS — L8990 Pressure ulcer of unspecified site, unspecified stage: Secondary | ICD-10-CM | POA: Insufficient documentation

## 2016-05-21 DIAGNOSIS — Z882 Allergy status to sulfonamides status: Secondary | ICD-10-CM

## 2016-05-21 DIAGNOSIS — Z7982 Long term (current) use of aspirin: Secondary | ICD-10-CM

## 2016-05-21 DIAGNOSIS — I10 Essential (primary) hypertension: Secondary | ICD-10-CM | POA: Diagnosis present

## 2016-05-21 DIAGNOSIS — K921 Melena: Principal | ICD-10-CM | POA: Diagnosis present

## 2016-05-21 DIAGNOSIS — Q2733 Arteriovenous malformation of digestive system vessel: Secondary | ICD-10-CM

## 2016-05-21 DIAGNOSIS — K922 Gastrointestinal hemorrhage, unspecified: Secondary | ICD-10-CM | POA: Diagnosis present

## 2016-05-21 DIAGNOSIS — Z8673 Personal history of transient ischemic attack (TIA), and cerebral infarction without residual deficits: Secondary | ICD-10-CM

## 2016-05-21 DIAGNOSIS — R195 Other fecal abnormalities: Secondary | ICD-10-CM | POA: Diagnosis present

## 2016-05-21 DIAGNOSIS — E785 Hyperlipidemia, unspecified: Secondary | ICD-10-CM

## 2016-05-21 DIAGNOSIS — K5730 Diverticulosis of large intestine without perforation or abscess without bleeding: Secondary | ICD-10-CM | POA: Diagnosis present

## 2016-05-21 DIAGNOSIS — Z720 Tobacco use: Secondary | ICD-10-CM | POA: Diagnosis present

## 2016-05-21 DIAGNOSIS — R634 Abnormal weight loss: Secondary | ICD-10-CM

## 2016-05-21 DIAGNOSIS — F17210 Nicotine dependence, cigarettes, uncomplicated: Secondary | ICD-10-CM | POA: Diagnosis present

## 2016-05-21 DIAGNOSIS — Z8249 Family history of ischemic heart disease and other diseases of the circulatory system: Secondary | ICD-10-CM

## 2016-05-22 DIAGNOSIS — E785 Hyperlipidemia, unspecified: Secondary | ICD-10-CM

## 2016-05-22 DIAGNOSIS — Z720 Tobacco use: Secondary | ICD-10-CM | POA: Diagnosis not present

## 2016-05-22 DIAGNOSIS — K5730 Diverticulosis of large intestine without perforation or abscess without bleeding: Secondary | ICD-10-CM | POA: Diagnosis present

## 2016-05-22 DIAGNOSIS — K922 Gastrointestinal hemorrhage, unspecified: Secondary | ICD-10-CM | POA: Diagnosis present

## 2016-05-22 DIAGNOSIS — Z7982 Long term (current) use of aspirin: Secondary | ICD-10-CM | POA: Diagnosis not present

## 2016-05-22 DIAGNOSIS — Z882 Allergy status to sulfonamides status: Secondary | ICD-10-CM | POA: Diagnosis not present

## 2016-05-22 DIAGNOSIS — R634 Abnormal weight loss: Secondary | ICD-10-CM

## 2016-05-22 DIAGNOSIS — F17210 Nicotine dependence, cigarettes, uncomplicated: Secondary | ICD-10-CM | POA: Diagnosis present

## 2016-05-22 DIAGNOSIS — I10 Essential (primary) hypertension: Secondary | ICD-10-CM

## 2016-05-22 DIAGNOSIS — Q2733 Arteriovenous malformation of digestive system vessel: Secondary | ICD-10-CM | POA: Diagnosis not present

## 2016-05-22 DIAGNOSIS — Z8673 Personal history of transient ischemic attack (TIA), and cerebral infarction without residual deficits: Secondary | ICD-10-CM | POA: Diagnosis not present

## 2016-05-22 DIAGNOSIS — R195 Other fecal abnormalities: Secondary | ICD-10-CM | POA: Diagnosis present

## 2016-05-22 DIAGNOSIS — Z8249 Family history of ischemic heart disease and other diseases of the circulatory system: Secondary | ICD-10-CM | POA: Diagnosis not present

## 2016-05-22 DIAGNOSIS — K921 Melena: Principal | ICD-10-CM

## 2016-05-22 DIAGNOSIS — L8990 Pressure ulcer of unspecified site, unspecified stage: Secondary | ICD-10-CM | POA: Insufficient documentation

## 2016-05-22 LAB — CBC
HCT: 38.8 % (ref 36.0–46.0)
HEMATOCRIT: 39 % (ref 36.0–46.0)
HEMOGLOBIN: 13 g/dL (ref 12.0–15.0)
Hemoglobin: 13.2 g/dL (ref 12.0–15.0)
MCH: 32.4 pg (ref 26.0–34.0)
MCH: 32.4 pg (ref 26.0–34.0)
MCHC: 33.8 g/dL (ref 30.0–36.0)
MCHC: 33.5 g/dL (ref 30.0–36.0)
MCV: 95.8 fL (ref 78.0–100.0)
MCV: 96.8 fL (ref 78.0–100.0)
PLATELETS: 198 10*3/uL (ref 150–400)
Platelets: 192 10*3/uL (ref 150–400)
RBC: 4.07 MIL/uL (ref 3.87–5.11)
RBC: 4.01 MIL/uL (ref 3.87–5.11)
RDW: 13 % (ref 11.5–15.5)
RDW: 13 % (ref 11.5–15.5)
WBC: 5.5 10*3/uL (ref 4.0–10.5)
WBC: 5.1 10*3/uL (ref 4.0–10.5)

## 2016-05-22 LAB — BASIC METABOLIC PANEL
Anion gap: 10 (ref 5–15)
BUN: 11 mg/dL (ref 6–20)
CALCIUM: 9.1 mg/dL (ref 8.9–10.3)
CHLORIDE: 109 mmol/L (ref 101–111)
CO2: 23 mmol/L (ref 22–32)
Creatinine, Ser: 0.73 mg/dL (ref 0.44–1.00)
GFR calc non Af Amer: >60 mL/min (ref >60)
Glucose, Bld: 67 mg/dL (ref 65–99)
POTASSIUM: 3.5 mmol/L (ref 3.5–5.1)
Sodium: 142 mmol/L (ref 135–145)

## 2016-05-22 LAB — LACTIC ACID, PLASMA
LACTIC ACID, VENOUS: 0.5 mmol/L (ref 0.5–1.9)
Lactic Acid, Venous: 0.7 mmol/L (ref 0.5–1.9)

## 2016-05-22 LAB — PROTIME-INR
INR: 1.1
Prothrombin Time: 14.3 seconds (ref 11.4–15.2)

## 2016-05-22 LAB — TSH: TSH: 1.512 u[IU]/mL (ref 0.350–4.500)

## 2016-05-22 MED ORDER — ACETAMINOPHEN 650 MG RE SUPP: 650.0000 mg | Freq: Four times a day (QID) | RECTAL | Status: DC | PRN

## 2016-05-22 MED ORDER — PEG 3350-KCL-NA BICARB-NACL 420 G PO SOLR
4000.0000 mL | Freq: Once | ORAL | Status: AC
  Administered 2016-05-22: 4000 mL via ORAL
  Filled 2016-05-22: qty 4000

## 2016-05-22 MED ORDER — ONDANSETRON HCL 4 MG/2ML IJ SOLN: 4.0000 mg | Freq: Four times a day (QID) | INTRAMUSCULAR | Status: DC | PRN

## 2016-05-22 MED ORDER — ACETAMINOPHEN 325 MG PO TABS: 650.0000 mg | ORAL_TABLET | Freq: Four times a day (QID) | ORAL | Status: DC | PRN

## 2016-05-22 MED ORDER — PEG 3350-KCL-NA BICARB-NACL 420 G PO SOLR: 4000.0000 mL | Freq: Once | ORAL | Status: DC

## 2016-05-22 MED ORDER — TETRAHYDROZOLINE HCL 0.05 % OP SOLN: 2.0000 [drp] | Freq: Two times a day (BID) | OPHTHALMIC | Status: DC | PRN

## 2016-05-22 MED ORDER — ROSUVASTATIN CALCIUM 10 MG PO TABS
10.0000 mg | ORAL_TABLET | Freq: Every day | ORAL | Status: DC
  Administered 2016-05-22 – 2016-05-23 (×2): 10 mg via ORAL
  Filled 2016-05-22 (×2): qty 1

## 2016-05-22 MED ORDER — PANTOPRAZOLE SODIUM 40 MG IV SOLR
40.0000 mg | Freq: Two times a day (BID) | INTRAVENOUS | Status: DC
  Administered 2016-05-22 – 2016-05-24 (×4): 40 mg via INTRAVENOUS
  Filled 2016-05-22 (×6): qty 40

## 2016-05-22 MED ORDER — ALUM & MAG HYDROXIDE-SIMETH 200-200-20 MG/5ML PO SUSP: 30.0000 mL | ORAL | Status: DC | PRN

## 2016-05-22 MED ORDER — ONDANSETRON HCL 4 MG PO TABS: 4.0000 mg | ORAL_TABLET | Freq: Four times a day (QID) | ORAL | Status: DC | PRN

## 2016-05-22 MED ORDER — NICOTINE 14 MG/24HR TD PT24: 14.0000 mg | MEDICATED_PATCH | Freq: Every day | TRANSDERMAL | Status: DC

## 2016-05-22 MED ORDER — LACTATED RINGERS IV SOLN
INTRAVENOUS | Status: DC
  Administered 2016-05-22 – 2016-05-23 (×4): via INTRAVENOUS

## 2016-05-22 NOTE — Consult Note (Signed)
Unassigned patient Reason for Consult: Black stools for 6 weeks. Referring Physician: THP  Yolanda Hopkins is an 76 y.o. female.  HPI: Yolanda Hopkins is a 76 year old white female with a history of hypertension, tobacco abuse and a stroke without residual deficit who was admitted with a history of melenic stools that she's had off and on since Thanksgiving of 2017. She denies any history of frank hematochezia. She denies any history of nausea vomiting, abdominal pain, jaundice, PUD or colitis. She claims she has a fairly good appetite but has lost about 50 lbs in the last year and that this weight loss has been unintentional. She gives a history of having a colonoscopy several years ago but cannot tell me exactly when it was done and who did her procedure she believes colonoscopy was normal at that time there is no family history of colon cancer patient seems somewhat anxious and combative during her interview. She does not seem to be very clear on the timeline of her symptoms. Her daughter who is also at the bedside and does not need does not seem to know the details regarding her mother's medical problems. She tells her mother is very obstinate and noncompliant with medical recommendations.   Past Medical History:  Diagnosis Date  . Hypertension   . Tobacco abuse 08/19/2013  . Vertigo    No past surgical history on file.  Family History  Problem Relation Age of Onset  . Hypertension Mother   . Hypertension Father   . Liver cancer Father    Social History:  reports that she has been smoking Cigarettes.  She does not have any smokeless tobacco history on file. She reports that she does not drink alcohol or use drugs.  Allergies:  Allergies  Allergen Reactions  . Sulfa Antibiotics Other (See Comments)   Medications: I have reviewed the patient's current medications.  Results for orders placed or performed during the hospital encounter of 05/21/16 (from the past 48 hour(s))  CBC      Status: None   Collection Time: 05/22/16  1:55 AM  Result Value Ref Range   WBC 5.5 4.0 - 10.5 K/uL   RBC 4.07 3.87 - 5.11 MIL/uL   Hemoglobin 13.2 12.0 - 15.0 g/dL   HCT 39.0 36.0 - 46.0 %   MCV 95.8 78.0 - 100.0 fL   MCH 32.4 26.0 - 34.0 pg   MCHC 33.8 30.0 - 36.0 g/dL   RDW 13.0 11.5 - 15.5 %   Platelets 198 150 - 400 K/uL  Protime-INR     Status: None   Collection Time: 05/22/16  1:55 AM  Result Value Ref Range   Prothrombin Time 14.3 11.4 - 15.2 seconds   INR 1.10   Lactic acid, plasma     Status: None   Collection Time: 05/22/16  2:03 AM  Result Value Ref Range   Lactic Acid, Venous 0.5 0.5 - 1.9 mmol/L  Lactic acid, plasma     Status: None   Collection Time: 05/22/16  5:21 AM  Result Value Ref Range   Lactic Acid, Venous 0.7 0.5 - 1.9 mmol/L  Basic metabolic panel     Status: None   Collection Time: 05/22/16  5:21 AM  Result Value Ref Range   Sodium 142 135 - 145 mmol/L   Potassium 3.5 3.5 - 5.1 mmol/L   Chloride 109 101 - 111 mmol/L   CO2 23 22 - 32 mmol/L   Glucose, Bld 67 65 - 99 mg/dL  BUN 11 6 - 20 mg/dL   Creatinine, Ser 0.73 0.44 - 1.00 mg/dL   Calcium 9.1 8.9 - 10.3 mg/dL   GFR calc non Af Amer >60 >60 mL/min   GFR calc Af Amer >60 >60 mL/min    Comment: (NOTE) The eGFR has been calculated using the CKD EPI equation. This calculation has not been validated in all clinical situations. eGFR's persistently <60 mL/min signify possible Chronic Kidney Disease.    Anion gap 10 5 - 15  CBC     Status: None   Collection Time: 05/22/16  5:21 AM  Result Value Ref Range   WBC 5.1 4.0 - 10.5 K/uL   RBC 4.01 3.87 - 5.11 MIL/uL   Hemoglobin 13.0 12.0 - 15.0 g/dL   HCT 38.8 36.0 - 46.0 %   MCV 96.8 78.0 - 100.0 fL   MCH 32.4 26.0 - 34.0 pg   MCHC 33.5 30.0 - 36.0 g/dL   RDW 13.0 11.5 - 15.5 %   Platelets 192 150 - 400 K/uL  TSH     Status: None   Collection Time: 05/22/16 10:12 AM  Result Value Ref Range   TSH 1.512 0.350 - 4.500 uIU/mL    Comment:  Performed by a 3rd Generation assay with a functional sensitivity of <=0.01 uIU/mL.   Review of Systems  Constitutional: Positive for malaise/fatigue and weight loss. Negative for diaphoresis.  HENT: Negative.   Eyes: Negative.   Respiratory: Negative.   Cardiovascular: Negative.   Gastrointestinal: Positive for blood in stool, constipation and melena. Negative for abdominal pain, diarrhea, nausea and vomiting.  Musculoskeletal: Positive for joint pain.  Skin: Negative.   Neurological: Positive for weakness.  Endo/Heme/Allergies: Negative.   Psychiatric/Behavioral: Negative.    Blood pressure (!) 143/65, pulse 80, temperature 98.1 F (36.7 C), temperature source Oral, resp. rate 17, weight 44.1 kg (97 lb 3.6 oz), SpO2 98 %. Physical Exam  Constitutional: She appears well-developed and well-nourished.  HENT:  Head: Normocephalic and atraumatic.  Eyes: Conjunctivae and EOM are normal. Pupils are equal, round, and reactive to light.  Neck: Normal range of motion. Neck supple.  GI: Soft. Bowel sounds are normal.  Skin: Skin is warm and dry.  Psychiatric: Her speech is normal. Her mood appears anxious. Her affect is inappropriate. She is agitated and aggressive. She expresses impulsivity.   Assessment/Plan: 1) Melenic stool with abnormal weight loss; will plan an EGD/Colonoscopy for tomorrow.  2) HTN. 3) History of CVA.   Yolanda Hopkins 05/22/2016, 2:31 PM

## 2016-05-22 NOTE — Progress Notes (Signed)
PROGRESS NOTE        PATIENT DETAILS Name: Yolanda Hopkins Age: 76 y.o. Sex: female Date of Birth: 05/12/1941 Admit Date: 05/21/2016 Admitting Physician Ozella Rocks, MD ZOX:WRUEAV,WUJWJXB Molly Maduro, MD  Brief Narrative: Patient is a 76 y.o. female with past medical history of hypertension, CVA, tobacco use presented to the hospital with intermittent melena for the past 6 weeks. Apparently, has lost approximately 50 pounds in the past 1 year. See below for further details  Subjective: Lying comfortably in bed. Denies any abdominal pain. Husband at bedside reports numerous stressors at home.  Assessment/Plan: Melena: Although complains of melanotic stools-hemoglobin is within normal limits. Check FOBT, continue PPI, await GI evaluation. Complains of weight loss and has never had endoscopic evaluation in the past.  Weight loss: Approximately 50 pounds weight loss over the past 1 year. Check TSH, await GI evaluation. May need a CT chest/abdomen at some point. Does have a history of tobacco use  History of CVA: Nonfocal exam-continue statin. Apparently stopped taking aspirin due to concern for melanotic stools.  Dyslipidemia: Continue statin   DVT Prophylaxis: SCD's  Code Status: Full code   Family Communication: Spouse at bedside  Disposition Plan: Remain inpatient-home when work up complete  Antimicrobial agents: Anti-infectives    None     Procedures: (None  CONSULTS:  GI  Time spent: 25 minutes-Greater than 50% of this time was spent in counseling, explanation of diagnosis, planning of further management, and coordination of care.  MEDICATIONS: Scheduled Meds: . pantoprazole (PROTONIX) IV  40 mg Intravenous Q12H  . rosuvastatin  10 mg Oral q1800   Continuous Infusions: . lactated ringers 100 mL/hr at 05/22/16 1151   PRN Meds:.acetaminophen **OR** acetaminophen, ondansetron **OR** ondansetron (ZOFRAN) IV   PHYSICAL EXAM: Vital  signs: Vitals:   05/21/16 2351 05/22/16 0619 05/22/16 0913  BP: (!) 158/73 132/67 (!) 143/65  Pulse: 67 75 80  Resp: 17 (!) 116 17  Temp: 98.5 F (36.9 C) 97.9 F (36.6 C) 98.1 F (36.7 C)  TempSrc: Oral Oral Oral  SpO2: 100% 96% 98%  Weight: 44.1 kg (97 lb 3.6 oz)     Filed Weights   05/21/16 2351  Weight: 44.1 kg (97 lb 3.6 oz)   Body mass index is 16.69 kg/m.   General appearance :Awake, alert, not in any distress. Speech Clear.  Eyes:, pupils equally reactive to light and accomodation,no scleral icterus.Pink conjunctiva HEENT: Atraumatic and Normocephalic Neck: supple, no JVD. No cervical lymphadenopathy. No thyromegaly Resp:Good air entry bilaterally, no added sounds  CVS: S1 S2 regular, no murmurs.  GI: Bowel sounds present, Non tender and not distended with no gaurding, rigidity or rebound.No organomegaly Extremities: B/L Lower Ext shows no edema, both legs are warm to touch Neurology:  speech clear,Non focal, sensation is grossly intact. Psychiatric: Normal judgment and insight. Alert and oriented x 3. Normal mood. Musculoskeletal:No digital cyanosis Skin:No Rash, warm and dry Wounds:N/A  I have personally reviewed following labs and imaging studies  LABORATORY DATA: CBC:  Recent Labs Lab 05/22/16 0155 05/22/16 0521  WBC 5.5 5.1  HGB 13.2 13.0  HCT 39.0 38.8  MCV 95.8 96.8  PLT 198 192    Basic Metabolic Panel:  Recent Labs Lab 05/22/16 0521  NA 142  K 3.5  CL 109  CO2 23  GLUCOSE 67  BUN 11  CREATININE 0.73  CALCIUM 9.1    GFR: CrCl cannot be calculated (Unknown ideal weight.).  Liver Function Tests: No results for input(s): AST, ALT, ALKPHOS, BILITOT, PROT, ALBUMIN in the last 168 hours. No results for input(s): LIPASE, AMYLASE in the last 168 hours. No results for input(s): AMMONIA in the last 168 hours.  Coagulation Profile:  Recent Labs Lab 05/22/16 0155  INR 1.10    Cardiac Enzymes: No results for input(s): CKTOTAL,  CKMB, CKMBINDEX, TROPONINI in the last 168 hours.  BNP (last 3 results) No results for input(s): PROBNP in the last 8760 hours.  HbA1C: No results for input(s): HGBA1C in the last 72 hours.  CBG: No results for input(s): GLUCAP in the last 168 hours.  Lipid Profile: No results for input(s): CHOL, HDL, LDLCALC, TRIG, CHOLHDL, LDLDIRECT in the last 72 hours.  Thyroid Function Tests:  Recent Labs  05/22/16 1012  TSH 1.512    Anemia Panel: No results for input(s): VITAMINB12, FOLATE, FERRITIN, TIBC, IRON, RETICCTPCT in the last 72 hours.  Urine analysis:    Component Value Date/Time   COLORURINE YELLOW 08/19/2013 1055   APPEARANCEUR CLEAR 08/19/2013 1055   LABSPEC 1.010 08/19/2013 1055   PHURINE 7.5 08/19/2013 1055   GLUCOSEU NEGATIVE 08/19/2013 1055   HGBUR NEGATIVE 08/19/2013 1055   BILIRUBINUR NEGATIVE 08/19/2013 1055   KETONESUR NEGATIVE 08/19/2013 1055   PROTEINUR NEGATIVE 08/19/2013 1055   UROBILINOGEN 0.2 08/19/2013 1055   NITRITE NEGATIVE 08/19/2013 1055   LEUKOCYTESUR NEGATIVE 08/19/2013 1055    Sepsis Labs: Lactic Acid, Venous    Component Value Date/Time   LATICACIDVEN 0.7 05/22/2016 0521    MICROBIOLOGY: No results found for this or any previous visit (from the past 240 hour(s)).  RADIOLOGY STUDIES/RESULTS: No results found.   LOS: 0 days   Jeoffrey MassedGHIMIRE,SHANKER, MD  [facility] Pager:[phone]  If 7PM-7AM, please contact night-coverage [email or web address] Password [email or web address] 05/22/2016, 1:22 PM

## 2016-05-22 NOTE — H&P (Signed)
History and Physical    [name]:[id number] DOB: 1940/08/04 DOA: 05/21/2016  PCP: Josue Hector, MD  Patient coming from: [facility])  Chief Complaint: dark stools  HPI: Yolanda Hopkins is a 76 y.o. female with medical history significant of HTN and tobacco abuse presenting with melanotic stools and weakness.  She was sleeping throughout my evaluation, having been given Ambien prior to transport.  History was obtained from Dr. Satira Sark accept note and from [facility] records.  She reports approximately 6 weeks of dark stools as well as weeks of weight loss.  FOBT positive.  CT unremarkable.  Hgb 16.  Dr. Elnoria Howard, GI, consulted and agreed to accept the patient.     Review of Systems: Unable to perform - patient is too somnolent   Past Medical History:  Diagnosis Date  . Hypertension   . Tobacco abuse 08/19/2013  . Vertigo     No past surgical history on file.  Social History   Social History  . Marital status: Married    Spouse name: N/A  . Number of children: N/A  . Years of education: N/A   Occupational History  . Not on file.   Social History Main Topics  . Smoking status: Current Some Day Smoker    Types: Cigarettes  . Smokeless tobacco: Not on file  . Alcohol use No  . Drug use: No  . Sexual activity: Not on file   Other Topics Concern  . Not on file   Social History Narrative  . No narrative on file    Allergies  Allergen Reactions  . Sulfur Nausea Only    Family History  Problem Relation Age of Onset  . Hypertension Mother   . Hypertension Father   . Liver cancer Father     Prior to Admission medications   Medication Sig Start Date End Date Taking? Authorizing Provider  aspirin 325 MG tablet Take 1 tablet (325 mg total) by mouth daily. 08/20/13   Maryann Mikhail, DO  diphenhydrAMINE (BENADRYL) 25 MG tablet Take 25 mg by mouth every 6 (six) hours as needed for allergies.    Historical Provider, MD  naproxen  sodium (ANAPROX) 220 MG tablet Take 220 mg by mouth daily as needed (pain).    Historical Provider, MD  nicotine (NICODERM CQ - DOSED IN MG/24 HOURS) 14 mg/24hr patch Place 1 patch (14 mg total) onto the skin daily. 08/20/13   Maryann Mikhail, DO  rosuvastatin (CRESTOR) 10 MG tablet Take 1 tablet (10 mg total) by mouth daily. 08/20/13   Maryann Mikhail, DO  Tetrahydrozoline HCl (VISINE OP) Place 2 drops into both eyes 2 (two) times daily as needed (dry eyes).    Historical Provider, MD    Physical Exam: Vitals:   05/21/16 2351  BP: (!) 158/73  Pulse: 67  Resp: 17  Temp: 98.5 F (36.9 C)  TempSrc: Oral  SpO2: 100%  Weight: 44.1 kg (97 lb 3.6 oz)     General:  Appears calm and comfortable and is NAD, snoring softly throughout evaluation Eyes: opened briefly and then closed again and remained closed throughout ENT: normal lips & tongue, mmm Neck:  no LAD, masses or thyromegaly Cardiovascular:  RRR, no m/r/g. No LE edema.  Respiratory:  CTA bilaterally, no w/r/r. Normal respiratory effort. Abdomen:  soft, ntnd, NABS; pulsatile mass c/w AAA Skin:  no rash or induration seen on limited exam Musculoskeletal:  grossly normal tone BUE/BLE,  no bony abnormality Psychiatric:  Somnolent, sleeping throughout  Neurologic:  Unable to perform  Labs on Admission: I have personally reviewed following labs and imaging studies  CBC: No results for input(s): WBC, NEUTROABS, HGB, HCT, MCV, PLT in the last 168 hours. Basic Metabolic Panel: No results for input(s): NA, K, CL, CO2, GLUCOSE, BUN, CREATININE, CALCIUM, MG, PHOS in the last 168 hours. GFR: CrCl cannot be calculated (Patient's most recent lab result is older than the maximum 21 days allowed.). Liver Function Tests: No results for input(s): AST, ALT, ALKPHOS, BILITOT, PROT, ALBUMIN in the last 168 hours. No results for input(s): LIPASE, AMYLASE in the last 168 hours. No results for input(s): AMMONIA in the last 168 hours. Coagulation  Profile: No results for input(s): INR, PROTIME in the last 168 hours. Cardiac Enzymes: No results for input(s): CKTOTAL, CKMB, CKMBINDEX, TROPONINI in the last 168 hours. BNP (last 3 results) No results for input(s): PROBNP in the last 8760 hours. HbA1C: No results for input(s): HGBA1C in the last 72 hours. CBG: No results for input(s): GLUCAP in the last 168 hours. Lipid Profile: No results for input(s): CHOL, HDL, LDLCALC, TRIG, CHOLHDL, LDLDIRECT in the last 72 hours. Thyroid Function Tests: No results for input(s): TSH, T4TOTAL, FREET4, T3FREE, THYROIDAB in the last 72 hours. Anemia Panel: No results for input(s): VITAMINB12, FOLATE, FERRITIN, TIBC, IRON, RETICCTPCT in the last 72 hours. Urine analysis:    Component Value Date/Time   COLORURINE YELLOW 08/19/2013 1055   APPEARANCEUR CLEAR 08/19/2013 1055   LABSPEC 1.010 08/19/2013 1055   PHURINE 7.5 08/19/2013 1055   GLUCOSEU NEGATIVE 08/19/2013 1055   HGBUR NEGATIVE 08/19/2013 1055   BILIRUBINUR NEGATIVE 08/19/2013 1055   KETONESUR NEGATIVE 08/19/2013 1055   PROTEINUR NEGATIVE 08/19/2013 1055   UROBILINOGEN 0.2 08/19/2013 1055   NITRITE NEGATIVE 08/19/2013 1055   LEUKOCYTESUR NEGATIVE 08/19/2013 1055    Creatinine Clearance: CrCl cannot be calculated (Patient's most recent lab result is older than the maximum 21 days allowed.).  Sepsis Labs: @LABRCNTIP (procalcitonin:4,lacticidven:4) )No results found for this or any previous visit (from the past 240 hour(s)).   Radiological Exams on Admission: No results found.  EKG: not done  Assessment/Plan Principal Problem:   GI bleed Active Problems:   HTN (hypertension)   Tobacco abuse   GI Bleed -Patient's weakness is likely related to upper GI bleeding -Her Hgb was high but this may be a reflection of hemoconcentration; will recheck in AM -She also has unintentional weight loss -She has been taking Naproxen and so there may be an ulcer that is causing the  symptoms. - will observe with plan for GI consultation tomorrow, Dr. Elnoria Howard to see. - NPO for possible EGD - LR at 100 mL/hr - Start IV pantoprazole 40 mg bid - Zofran IV for nausea - Avoid NSAIDs and SQ heparin - Maintain IV access (2 large bore IVs if possible). - Monitor closely and follow cbc - LaB: INR, PTT, troponin x 1, Lactate  HTN -Slightly high at the time of admission, but appropriate for her age -She is not taking medication for this issue  Tobacco dependence -Encourage cessation.   -Patch ordered.  DVT prophylaxis:  SCDs Code Status:  Full -will need to be confirmed with patient/family tomorrow Family Communication: None present Disposition Plan:  Home once clinically improved Consults called: GI  Admission status: It is my clinical opinion that referral for OBSERVATION is reasonable and necessary in this patient based on the above information provided. The aforementioned taken together are felt to place the patient at high risk for  further clinical deterioration. However it is anticipated that the patient may be medically stable for discharge from the hospital within 24 to 48 hours.     Jonah BlueJennifer Gar Glance MD [facility]  If 7PM-7AM, please contact night-coverage [email or web address] Password [email or web address]  05/22/2016, 12:46 AM

## 2016-05-22 NOTE — Care Management Obs Status (Signed)
MEDICARE OBSERVATION STATUS NOTIFICATION   Patient Details  Name: Yolanda Hopkins MRN: [id number] Date of Birth: Jan 19, 1941   Medicare Observation Status Notification Given:  Yes    Shara Hartis, Annamarie MajorCheryl U, RN 05/22/2016, 5:09 PM

## 2016-05-22 NOTE — Progress Notes (Addendum)
[id number]; pt. alert and a little slow to respond/answer questions. No family with pt.; oriented to room and hospital policies.

## 2016-05-22 NOTE — Plan of Care (Signed)
Problem: Education: Goal: Knowledge of [facility] Education information/materials will improve Outcome: Progressing POC reviewed with pt.   

## 2016-05-23 ENCOUNTER — Inpatient Hospital Stay (HOSPITAL_COMMUNITY): Payer: Medicare Other | Admitting: Certified Registered Nurse Anesthetist

## 2016-05-23 ENCOUNTER — Encounter (HOSPITAL_COMMUNITY)
Admission: AD | Disposition: A | Discharge status: Home / Self Care | Payer: Self-pay | Source: Other Acute Inpatient Hospital | Attending: Internal Medicine

## 2016-05-23 ENCOUNTER — Encounter (HOSPITAL_COMMUNITY): Payer: Self-pay | Admitting: Certified Registered Nurse Anesthetist

## 2016-05-23 HISTORY — PX: COLONOSCOPY: SHX5424

## 2016-05-23 HISTORY — PX: ESOPHAGOGASTRODUODENOSCOPY: SHX5428

## 2016-05-23 HISTORY — PX: GIVENS CAPSULE STUDY: SHX5432

## 2016-05-23 LAB — CBC
HCT: 38.5 % (ref 36.0–46.0)
Hemoglobin: 13.3 g/dL (ref 12.0–15.0)
MCH: 32.6 pg (ref 26.0–34.0)
MCHC: 34.5 g/dL (ref 30.0–36.0)
MCV: 94.4 fL (ref 78.0–100.0)
PLATELETS: 192 10*3/uL (ref 150–400)
RBC: 4.08 MIL/uL (ref 3.87–5.11)
RDW: 12.6 % (ref 11.5–15.5)
WBC: 5.3 10*3/uL (ref 4.0–10.5)

## 2016-05-23 SURGERY — EGD (ESOPHAGOGASTRODUODENOSCOPY)
Anesthesia: Monitor Anesthesia Care

## 2016-05-23 MED ORDER — SODIUM CHLORIDE 0.9 % IV SOLN: INTRAVENOUS | Status: DC

## 2016-05-23 MED ORDER — PROPOFOL 10 MG/ML IV BOLUS
INTRAVENOUS | Status: DC | PRN
  Administered 2016-05-23 (×2): 20 mg via INTRAVENOUS

## 2016-05-23 MED ORDER — PROPOFOL 500 MG/50ML IV EMUL
INTRAVENOUS | Status: DC | PRN
  Administered 2016-05-23: 75 ug/kg/min via INTRAVENOUS

## 2016-05-23 NOTE — Op Note (Signed)
[facility] Patient Name: Yolanda Hopkins Procedure Date : 05/23/2016 MRN: [id number] Attending MD: Jeani HawkingPatrick Yuliya Nova , MD Date of Birth: 02-14-1941 CSN: [id number] Age: 76 Admit Type: Inpatient Procedure:                Colonoscopy Indications:              Melena Providers:                Jeani HawkingPatrick Jamise Pentland, MD, Harold BarbanLiz Honeycutt, RN, Kandice RobinsonsGuillaume                            [place], Technician Referring MD:              Medicines:                Propofol per Anesthesia Complications:            No immediate complications. Estimated Blood Loss:     Estimated blood loss: none. Procedure:                Pre-Anesthesia Assessment:                           - Prior to the procedure, a History and Physical                            was performed, and patient medications and                            allergies were reviewed. The patient's tolerance of                            previous anesthesia was also reviewed. The risks                            and benefits of the procedure and the sedation                            options and risks were discussed with the patient.                            All questions were answered, and informed consent                            was obtained. Prior Anticoagulants: The patient has                            taken no previous anticoagulant or antiplatelet                            agents. ASA Grade Assessment: II - A patient with                            mild systemic disease. After reviewing the risks                            and benefits, the  patient was deemed in                            satisfactory condition to undergo the procedure.                           - Sedation was administered by an anesthesia                            professional. Deep sedation was attained.                           After obtaining informed consent, the colonoscope                            was passed under direct vision. Throughout the             procedure, the patient's blood pressure, pulse, and                            oxygen saturations were monitored continuously. The                            EC-[id number] ([id number]) scope was introduced through                            the anus and advanced to the the cecum, identified                            by appendiceal orifice and ileocecal valve. The                            colonoscopy was performed without difficulty. The                            patient tolerated the procedure well. The quality                            of the bowel preparation was good. The ileocecal                            valve, appendiceal orifice, and rectum were                            photographed. Scope In: 2:49:00 PM Scope Out: 3:07:01 PM Scope Withdrawal Time: 0 hours 9 minutes 45 seconds  Total Procedure Duration: 0 hours 18 minutes 1 second  Findings:      A few small-mouthed diverticula were found in the sigmoid colon. Impression:               - Diverticulosis in the sigmoid colon.                           - No specimens collected. Moderate Sedation:      None Recommendation:           -  Return patient to hospital ward for ongoing care.                           - NPO.                           - Continue present medications.                           - Repeat colonoscopy is not recommended for                            surveillance with her age and current findings.                           - To visualize the small bowel, perform video                            capsule endoscopy today. Procedure Code(s):        --- Professional ---                           [phone], Colonoscopy, flexible; diagnostic, including                            collection of specimen(s) by brushing or washing,                            when performed (separate procedure) Diagnosis Code(s):        --- Professional ---                           K92.1, Melena (includes Hematochezia)                            K57.30, Diverticulosis of large intestine without                            perforation or abscess without bleeding CPT copyright 2016 American Medical Association. All rights reserved. The codes documented in this report are preliminary and upon coder review may  be revised to meet current compliance requirements. Jeani Hawking, MD Jeani Hawking, MD 05/23/2016 3:17:40 PM This report has been signed electronically. Number of Addenda: 0

## 2016-05-23 NOTE — Progress Notes (Signed)
PROGRESS NOTE        PATIENT DETAILS Name: Yolanda Hopkins Age: 76 y.o. Sex: female Date of Birth: [birth year] Admit Date: 05/21/2016 Admitting Physician Ozella Rocks, MD ZOX:WRUEAV,WUJWJXB Molly Maduro, MD  Brief Narrative: Patient is a 76 y.o. female with past medical history of hypertension, CVA, tobacco use presented to the hospital with intermittent melena for the past 6 weeks. Apparently, has lost approximately 50 pounds in the past 1 year. See below for further details  Subjective: Lying comfortably in bed.No complaints-spouse at bedside  Assessment/Plan: Melena: Although complains of melanotic stools-hemoglobin is within normal limits. Evaluated by GI, plans are for endoscopic evaluation that her today.  Weight loss: Approximately 50 pounds weight loss over the past 1 year. Check TSH, await GI evaluation. May need a CT chest-suspect could be done in the outpatient setting-patient apparently has an appointment with PCP next week. Does have a history of tobacco use  History of CVA: Nonfocal exam-continue statin. Apparently stopped taking aspirin due to concern for melanotic stools.  Dyslipidemia: Continue statin   DVT Prophylaxis: SCD's  Code Status: Full code   Family Communication: Spouse at bedside  Disposition Plan: Remain inpatient-home later today if endoscopic evaluation does not show any significant abnormalities.   Antimicrobial agents: Anti-infectives    None     Procedures: None  CONSULTS:  GI  Time spent: 25 minutes-Greater than 50% of this time was spent in counseling, explanation of diagnosis, planning of further management, and coordination of care.  MEDICATIONS: Scheduled Meds: . [MAR Hold] pantoprazole (PROTONIX) IV  40 mg Intravenous Q12H  . [MAR Hold] rosuvastatin  10 mg Oral q1800   Continuous Infusions: . sodium chloride    . lactated ringers 100 mL/hr at 05/22/16 1151   PRN Meds:.[MAR Hold] acetaminophen  **OR** [MAR Hold] acetaminophen, [MAR Hold] alum & mag hydroxide-simeth, [MAR Hold] ondansetron **OR** [MAR Hold] ondansetron (ZOFRAN) IV   PHYSICAL EXAM: Vital signs: Vitals:   05/22/16 2138 05/23/16 0532 05/23/16 0954 05/23/16 1337  BP: (!) 145/74 (!) 161/68 (!) 148/76 (!) 143/70  Pulse: 73 77 73 67  Resp: 16 18 17 17   Temp: 98.1 F (36.7 C) 98.4 F (36.9 C) 97.5 F (36.4 C) 98.6 F (37 C)  TempSrc: Oral Oral Oral Oral  SpO2: 99% 95% 97% 94%  Weight:  43.8 kg (96 lb 9.6 oz)  43.7 kg (96 lb 6 oz)  Height:    5\' 4"  (1.626 m)   Filed Weights   05/21/16 2351 05/23/16 0532 05/23/16 1337  Weight: 44.1 kg (97 lb 3.6 oz) 43.8 kg (96 lb 9.6 oz) 43.7 kg (96 lb 6 oz)   Body mass index is 16.54 kg/m.   General appearance :Awake, alert, not in any distress. Speech Clear.  Eyes:, pupils equally reactive to light and accomodation,no scleral icterus.Pink conjunctiva HEENT: Atraumatic and Normocephalic Neck: supple, no JVD. No cervical lymphadenopathy. No thyromegaly Resp:Good air entry bilaterally, no added sounds  CVS: S1 S2 regular, no murmurs.  GI: Bowel sounds present, Non tender and not distended with no gaurding, rigidity or rebound.No organomegaly Extremities: B/L Lower Ext shows no edema, both legs are warm to touch Neurology:  speech clear,Non focal, sensation is grossly intact. Psychiatric: Normal judgment and insight. Alert and oriented x 3. Normal mood. Musculoskeletal:No digital cyanosis Skin:No Rash, warm and dry Wounds:N/A  I have personally reviewed following  labs and imaging studies  LABORATORY DATA: CBC:  Recent Labs Lab 05/22/16 0155 05/22/16 0521 05/23/16 0521  WBC 5.5 5.1 5.3  HGB 13.2 13.0 13.3  HCT 39.0 38.8 38.5  MCV 95.8 96.8 94.4  PLT 198 192 192    Basic Metabolic Panel:  Recent Labs Lab 05/22/16 0521  NA 142  K 3.5  CL 109  CO2 23  GLUCOSE 67  BUN 11  CREATININE 0.73  CALCIUM 9.1    GFR: Estimated Creatinine Clearance: 41.3  mL/min (by C-G formula based on SCr of 0.73 mg/dL).  Liver Function Tests: No results for input(s): AST, ALT, ALKPHOS, BILITOT, PROT, ALBUMIN in the last 168 hours. No results for input(s): LIPASE, AMYLASE in the last 168 hours. No results for input(s): AMMONIA in the last 168 hours.  Coagulation Profile:  Recent Labs Lab 05/22/16 0155  INR 1.10    Cardiac Enzymes: No results for input(s): CKTOTAL, CKMB, CKMBINDEX, TROPONINI in the last 168 hours.  BNP (last 3 results) No results for input(s): PROBNP in the last 8760 hours.  HbA1C: No results for input(s): HGBA1C in the last 72 hours.  CBG: No results for input(s): GLUCAP in the last 168 hours.  Lipid Profile: No results for input(s): CHOL, HDL, LDLCALC, TRIG, CHOLHDL, LDLDIRECT in the last 72 hours.  Thyroid Function Tests:  Recent Labs  05/22/16 1012  TSH 1.512    Anemia Panel: No results for input(s): VITAMINB12, FOLATE, FERRITIN, TIBC, IRON, RETICCTPCT in the last 72 hours.  Urine analysis:    Component Value Date/Time   COLORURINE YELLOW 08/19/2013 1055   APPEARANCEUR CLEAR 08/19/2013 1055   LABSPEC 1.010 08/19/2013 1055   PHURINE 7.5 08/19/2013 1055   GLUCOSEU NEGATIVE 08/19/2013 1055   HGBUR NEGATIVE 08/19/2013 1055   BILIRUBINUR NEGATIVE 08/19/2013 1055   KETONESUR NEGATIVE 08/19/2013 1055   PROTEINUR NEGATIVE 08/19/2013 1055   UROBILINOGEN 0.2 08/19/2013 1055   NITRITE NEGATIVE 08/19/2013 1055   LEUKOCYTESUR NEGATIVE 08/19/2013 1055    Sepsis Labs: Lactic Acid, Venous    Component Value Date/Time   LATICACIDVEN 0.7 05/22/2016 0521    MICROBIOLOGY: No results found for this or any previous visit (from the past 240 hour(s)).  RADIOLOGY STUDIES/RESULTS: No results found.   LOS: 1 day   Jeoffrey MassedGHIMIRE,Jermani Pund, MD  [facility] Pager:[phone]  If 7PM-7AM, please contact night-coverage [email or web address] Password [email or web address] 05/23/2016, 1:43 PM

## 2016-05-23 NOTE — Transfer of Care (Signed)
Immediate Anesthesia Transfer of Care Note  Patient: Yolanda Hopkins  Procedure(s) Performed: Procedure(s): ESOPHAGOGASTRODUODENOSCOPY (EGD) (N/A) COLONOSCOPY (N/A) GIVENS CAPSULE STUDY (N/A)  Patient Location: PACU  Anesthesia Type:MAC  Level of Consciousness: awake and alert   Airway & Oxygen Therapy: Patient Spontanous Breathing  Post-op Assessment: Report given to RN and Post -op Vital signs reviewed and stable  Post vital signs: Reviewed and stable  Last Vitals:  Vitals:   05/23/16 0954 05/23/16 1337  BP: (!) 148/76 (!) 143/70  Pulse: 73 67  Resp: 17 17  Temp: 36.4 C 37 C    Last Pain:  Vitals:   05/23/16 1337  TempSrc: Oral  PainSc:          Complications: No apparent anesthesia complications

## 2016-05-23 NOTE — Op Note (Signed)
[facility] Patient Name: Yolanda Hopkins Procedure Date : 05/23/2016 MRN: [id number] Attending MD: Jeani Hawking , MD Date of Birth: 23-Jan-1941 CSN: [id number] Age: 76 Admit Type: Inpatient Procedure:                Upper GI endoscopy Indications:              Heme positive stool, Melena Providers:                Jeani Hawking, MD, Harold Barban, RN, Kandice Robinsons, Technician Referring MD:              Medicines:                Propofol per Anesthesia Complications:            No immediate complications. Estimated Blood Loss:     Estimated blood loss: none. Procedure:                Pre-Anesthesia Assessment:                           - Prior to the procedure, a History and Physical                            was performed, and patient medications and                            allergies were reviewed. The patient's tolerance of                            previous anesthesia was also reviewed. The risks                            and benefits of the procedure and the sedation                            options and risks were discussed with the patient.                            All questions were answered, and informed consent                            was obtained. Prior Anticoagulants: The patient has                            taken no previous anticoagulant or antiplatelet                            agents. ASA Grade Assessment: II - A patient with                            mild systemic disease. After reviewing the risks  and benefits, the patient was deemed in                            satisfactory condition to undergo the procedure.                           - Sedation was administered by an anesthesia                            professional. Deep sedation was attained.                           After obtaining informed consent, the endoscope was                            passed under direct vision.  Throughout the                            procedure, the patient's blood pressure, pulse, and                            oxygen saturations were monitored continuously. The                            EC-[id number] ([id number]) scope was introduced through                            the mouth, and advanced to the third part of                            duodenum. The upper GI endoscopy was accomplished                            without difficulty. The patient tolerated the                            procedure well. Scope In: Scope Out: Findings:      The esophagus was normal.      The stomach was normal.      The examined duodenum was normal. Impression:               - Normal esophagus.                           - Normal stomach.                           - Normal examined duodenum.                           - No specimens collected. Moderate Sedation:      None Recommendation:           - Proceed with the colonoscopy.                           - Make the patient NPO [Start  day].                           - Continue present medications. Procedure Code(s):        --- Professional ---                           [id number], Esophagogastroduodenoscopy, flexible,                            transoral; diagnostic, including collection of                            specimen(s) by brushing or washing, when performed                            (separate procedure) Diagnosis Code(s):        --- Professional ---                           R19.5, Other fecal abnormalities                           K92.1, Melena (includes Hematochezia) CPT copyright 2016 American Medical Association. All rights reserved. The codes documented in this report are preliminary and upon coder review may  be revised to meet current compliance requirements. Jeani HawkingPatrick Janie Strothman, MD Jeani HawkingPatrick Dodi Leu, MD 05/23/2016 3:20:10 PM This report has been signed electronically. Number of Addenda: 0

## 2016-05-23 NOTE — Anesthesia Preprocedure Evaluation (Signed)
Anesthesia Evaluation  Patient identified by MRN, date of birth, ID band Patient awake    Reviewed: Allergy & Precautions, NPO status , Patient's Chart, lab work & pertinent test results  History of Anesthesia Complications Negative for: history of anesthetic complications  Airway Mallampati: II  TM Distance: >3 FB Neck ROM: Full    Dental  (+) Upper Dentures, Partial Lower, Dental Advisory Given   Pulmonary Current Smoker,    Pulmonary exam normal        Cardiovascular hypertension, Normal cardiovascular exam     Neuro/Psych CVA, No Residual Symptoms negative psych ROS   GI/Hepatic Neg liver ROS,   Endo/Other  negative endocrine ROS  Renal/GU negative Renal ROS     Musculoskeletal   Abdominal   Peds  Hematology   Anesthesia Other Findings   Reproductive/Obstetrics                             Anesthesia Physical Anesthesia Plan  ASA: II  Anesthesia Plan: MAC   Post-op Pain Management:    Induction:   Airway Management Planned: Natural Airway and Simple Face Mask  Additional Equipment:   Intra-op Plan:   Post-operative Plan:   Informed Consent: I have reviewed the patients History and Physical, chart, labs and discussed the procedure including the risks, benefits and alternatives for the proposed anesthesia with the patient or authorized representative who has indicated his/her understanding and acceptance.   Dental advisory given  Plan Discussed with: CRNA and Anesthesiologist  Anesthesia Plan Comments:         Anesthesia Quick Evaluation

## 2016-05-24 ENCOUNTER — Encounter (HOSPITAL_COMMUNITY): Payer: Self-pay | Admitting: Gastroenterology

## 2016-05-24 ENCOUNTER — Inpatient Hospital Stay (HOSPITAL_COMMUNITY): Payer: Medicare Other

## 2016-05-24 LAB — URINALYSIS, ROUTINE W REFLEX MICROSCOPIC
Bilirubin Urine: NEGATIVE
Glucose, UA: NEGATIVE mg/dL
KETONES UR: 20 mg/dL — AB
NITRITE: NEGATIVE
PROTEIN: NEGATIVE mg/dL
Specific Gravity, Urine: 1.01 (ref 1.005–1.030)
pH: 7 (ref 5.0–8.0)

## 2016-05-24 NOTE — Anesthesia Postprocedure Evaluation (Addendum)
Anesthesia Post Note  Patient: Yolanda Hopkins  Procedure(s) Performed: Procedure(s) (LRB): ESOPHAGOGASTRODUODENOSCOPY (EGD) (N/A) COLONOSCOPY (N/A) GIVENS CAPSULE STUDY (N/A)  Patient location during evaluation: PACU Anesthesia Type: MAC Level of consciousness: awake and alert Pain management: pain level controlled Vital Signs Assessment: post-procedure vital signs reviewed and stable Respiratory status: spontaneous breathing and respiratory function stable Cardiovascular status: stable Anesthetic complications: no              Olin Gurski DANIEL

## 2016-05-24 NOTE — Progress Notes (Signed)
Initial Nutrition Assessment  DOCUMENTATION CODES:   Severe malnutrition in context of social or environmental circumstances, Underweight  INTERVENTION: Vanilla Ensure Enlive BID between meals. Encouraged pt to continue Ensure Enlive at home.   NUTRITION DIAGNOSIS:   Malnutrition related to social / environmental circumstances as evidenced by severe depletion of muscle mass, energy intake < or equal to 75% for > or equal to 1 month, severe depletion of body fat, 36.8 percent weight loss over 1 year.    GOAL:   Patient will meet greater than or equal to 90% of their needs    MONITOR:   Supplement acceptance, Weight trends, PO intake, I & O's  REASON FOR ASSESSMENT:   Other (Comment) (Low BMI)    ASSESSMENT:   76 yo female patient with PMH of hypertension, CVA, tobacco use. Presented to hospital with intermittent melena for past 6 weeks. Unintentional weight loss of approximately 50 lbs in past year.  Pt stated urge to use bathroom after every meal, however unsure if pt always has stool after PO intake. Husband said pt sits on toilet for one hour each time. Typical diet consists of cereal in am, vegetables for lunch and pimento cheese and crackers for dinner.Husband reports weight loss/intake could be related to passing of pt grandson. MD note also suggests same.  NFPE showed severe depletion of fat and muscle mass. Trialed [employer]in[employer]ost Breeze and Ensure. Pt prefers Ensure.   Diet Order:  Diet regular Room service appropriate? Yes; Fluid consistency: Thin Diet general  Skin:  Reviewed, no issues  Last BM:  05/21/2016 pt reports constant urgency for bowel movement, sitting on toilet for an hour each time  Height:   Ht Readings from Last 1 Encounters:  05/23/16 5\' 4"  (1.626 m)    Weight:   Wt Readings from Last 1 Encounters:  05/23/16 95 lb 14.4 oz (43.5 kg)    Ideal Body Weight:  54.5 kg  BMI:  Body mass index is 16.46 kg/m.  Estimated Nutritional Needs:   Kcal:   1300-1500  Protein:  65-75 grams  Fluid:  >/= 1.5 L  EDUCATION NEEDS:   Education needs addressed Rex KrasGrace Ann Patty Lopezgarcia M.S. Nutrition Dietetic Intern

## 2016-05-24 NOTE — Progress Notes (Signed)
Initial Nutrition Assessment  DOCUMENTATION CODES:   Severe malnutrition in context of social or environmental circumstances, Underweight  INTERVENTION: Boost Breeze    NUTRITION DIAGNOSIS:   Malnutrition related to social / environmental circumstances as evidenced by severe depletion of muscle mass, energy intake < or equal to 75% for > or equal to 1 month, severe depletion of body fat, percent weight loss.    GOAL:   Patient will meet greater than or equal to 90% of their needs    MONITOR:   Supplement acceptance, Weight trends  REASON FOR ASSESSMENT:  Other (Comment) (Low BMI)    ASSESSMENT:   76 yo female patient with PMH of hypertension, CVA, tobacco use. Presented to hospital with intermittent melena for past 6 weeks. Unintentional weight loss of approximately 50 lbs in past year.   Ptt reported feeling the need to go directly to bathroom after every  Meal. Pt's husband spoke a lot on behalf of pt. He stated she barely ate during day and would sit on toilet for an hour at a time after every meal. Husband said her typical diet consisted of cereal in am, pimento cheese and crackers for lunch, and vegetables for dinner. Husband reports tremendous stress on pt with death of 77 yo grandson, and husband himself has afib.NFPE showed severe depletion in all areas.     Diet Order:  Diet regular Room service appropriate? Yes; Fluid consistency: Thin Diet general  Skin:  Reviewed, no issues  Last BM:  05/21/2016 pt reports constant urgency for bowel movement, sitting on toilet for an hour each time  Height:   Ht Readings from Last 1 Encounters:  05/23/16 5\' 4"  (1.626 m)    Weight:   Wt Readings from Last 1 Encounters:  05/23/16 95 lb 14.4 oz (43.5 kg)    Ideal Body Weight:  54.5 kg  BMI:  Body mass index is 16.46 kg/m.  Estimated Nutritional Needs:   Kcal:  1300-15001300-1500  Protein:  65-75 grams  Fluid:  >/= 1.5 L  EDUCATION NEEDS:   Education needs  addressed

## 2016-05-24 NOTE — Discharge Summary (Signed)
PATIENT DETAILS Name: Yolanda Hopkins Age: 76 y.o. Sex: female Date of Birth: 1940/06/05 MRN: [id number]. Admitting Physician: Ozella Rocks, MD FAO:[email or web address],HQIONGE ROBERT, MD  Admit Date: 05/21/2016 Discharge date: 05/24/2016  Recommendations for Outpatient Follow-up:  1. Follow up with PCP in 1-2 weeks 2. Please obtain BMP/CBC in one week 3. Continue outpatient evaluation of weight loss   Admitted From:  Home  Disposition: Home   Home Health: No  Equipment/Devices: None  Discharge Condition: Stable  CODE STATUS: FULL CODE  Diet recommendation:   Regular  Brief Summary: See H&P, Labs, Consult and Test reports for all details in brief,Patient is a 76 y.o. female with past medical history of hypertension, CVA, tobacco use presented to the hospital with intermittent melena for the past 6 weeks. Apparently, has lost approximately 50 pounds in the past 1 year. See below for further details  Brief Hospital Course: ?Melena: Although complains of melanotic stools-hemoglobin is within normal limits. Evaluated by GI, endoscopy evaluation with EGD/colonoscopy did not demonstrate any major lesions. 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 Spoke with Dr. Elnoria Howard, okay for discharge today. Follow up with him in one month.  Continue outpatient follow-up with PCP for periodic CBC monitoring.  Weight loss: Approximately 50 pounds weight loss over the past 1 year. TSH within normal limits. Endoscopy/colonoscopy negative for malignancy. CT chest did not show any acute abnormalities. She has been under a lot of stress-not sure if this is related to psychogenic issues. Suggest continued outpatient follow-up/evaluation by PCP.   History of CVA: Nonfocal exam-continue statin. Apparently stopped taking aspirin due to concern for melanotic stools.  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-non bleeding  Discharge Diagnoses:  Principal Problem:   GI bleed Active Problems:   HTN (hypertension)   Tobacco abuse   Loss of weight   Dyslipidemia   Pressure injury of skin   Discharge Instructions:  Activity:  As tolerated with Full fall precautions use walker/cane & assistance as needed   Discharge Instructions    Call MD for:  extreme fatigue    Complete by:  As directed    Call MD for:  persistant dizziness or light-headedness    Complete by:  As directed    Diet general    Complete by:  As directed    Increase activity slowly    Complete by:  As directed      Allergies as of 05/24/2016      Reactions   Sulfa Antibiotics Other (See Comments)      Medication List    TAKE these medications   rosuvastatin 10 MG tablet Commonly known as:  CRESTOR Take 1 tablet (10 mg total) by mouth daily.      Follow-up Information    Josue Hector, MD. Schedule an appointment as soon as possible for a visit in 1 week(s).   Specialty:  Family Medicine Contact information: [address] [place] [address] [phone]          Allergies  Allergen Reactions  . Sulfa Antibiotics Other (See Comments)    Consultations:   GI  Other Procedures/Studies: Ct Chest Wo Contrast  Result Date: 05/24/2016 CLINICAL DATA:  Chronic weight loss of 50 pounds over the past year. Initial encounter. EXAM: CT CHEST WITHOUT CONTRAST TECHNIQUE: Multidetector CT imaging of the chest was performed following the standard protocol without  IV contrast. COMPARISON:  Chest radiograph performed 05/21/2016 FINDINGS: Cardiovascular: Scattered coronary artery calcifications are seen. The heart is otherwise unremarkable in appearance. Scattered calcification is seen along the thoracic aorta. The great vessels are grossly unremarkable in appearance. Mediastinum/Nodes:  The mediastinum is grossly unremarkable. No mediastinal lymphadenopathy is seen. No pericardial effusion is identified. The thyroid gland is unremarkable. No axillary lymphadenopathy is appreciated. Lungs/Pleura: Scarring is noted at the lung apices. The lungs are otherwise grossly clear. No focal consolidation, pleural effusion or pneumothorax is seen. No masses are identified. Upper Abdomen: The visualized portions of the liver and the spleen are unremarkable in appearance. The visualized portions of the stomach, pancreas, adrenal glands and kidneys are unremarkable. Musculoskeletal: No acute osseous abnormalities are identified. The visualized musculature is unremarkable in appearance. IMPRESSION: 1. No acute abnormality seen within the chest. 2. Scattered coronary artery calcifications seen. 3. Scarring at the lung apices.  Lungs otherwise grossly clear. Electronically Signed   By: Roanna Raider M.D.   On: 05/24/2016 01:11     TODAY-DAY OF DISCHARGE:  Subjective:   Yolanda Hopkins today has no headache,no chest abdominal pain,no new weakness tingling or numbness, feels much better wants to go home today.   Objective:   Blood pressure 126/63, pulse 79, temperature (!) 100.4 F (38 C), temperature source Oral, resp. rate 18, height 5\' 4"  (1.626 m), weight 43.5 kg (95 lb 14.4 oz), SpO2 96 %.  Intake/Output Summary (Last 24 hours) at 05/24/16 1153 Last data filed at 05/24/16 1020  Gross per 24 hour  Intake              980 ml  Output              600 ml  Net              380 ml   Filed Weights   05/23/16 1337 05/23/16 1545 05/23/16 2126  Weight: 43.7 kg (96 lb 6 oz) 43.7 kg (96 lb 6 oz) 43.5 kg (95 lb 14.4 oz)    Exam: Awake Alert, Oriented *3, No new F.N deficits, Normal affect Wirt.AT,PERRAL Supple Neck,No JVD, No cervical lymphadenopathy appriciated.  Symmetrical Chest wall movement, Good air movement bilaterally, CTAB RRR,No Gallops,Rubs or new Murmurs, No Parasternal Heave +ve  B.Sounds, Abd Soft, Non tender, No organomegaly appriciated, No rebound -guarding or rigidity. No Cyanosis, Clubbing or edema, No new Rash or bruise   PERTINENT RADIOLOGIC STUDIES: Ct Chest Wo Contrast  Result Date: 05/24/2016 CLINICAL DATA:  Chronic weight loss of 50 pounds over the past year. Initial encounter. EXAM: CT CHEST WITHOUT CONTRAST TECHNIQUE: Multidetector CT imaging of the chest was performed following the standard protocol without IV contrast. COMPARISON:  Chest radiograph performed 05/21/2016 FINDINGS: Cardiovascular: Scattered coronary artery calcifications are seen. The heart is otherwise unremarkable in appearance. Scattered calcification is seen along the thoracic aorta. The great vessels are grossly unremarkable in appearance. Mediastinum/Nodes: The mediastinum is grossly unremarkable. No mediastinal lymphadenopathy is seen. No pericardial effusion is identified. The thyroid gland is unremarkable. No axillary lymphadenopathy is appreciated. Lungs/Pleura: Scarring is noted at the lung apices. The lungs are otherwise grossly clear. No focal consolidation, pleural effusion or pneumothorax is seen. No masses are identified. Upper Abdomen: The visualized portions of the liver and the spleen are unremarkable in appearance. The visualized portions of the stomach, pancreas, adrenal glands and kidneys are unremarkable. Musculoskeletal: No acute osseous abnormalities are identified. The visualized musculature is unremarkable in appearance. IMPRESSION: 1. No acute abnormality seen  within the chest. 2. Scattered coronary artery calcifications seen. 3. Scarring at the lung apices.  Lungs otherwise grossly clear. Electronically Signed   By: Roanna Raider M.D.   On: 05/24/2016 01:11     PERTINENT LAB RESULTS: CBC:  Recent Labs  05/22/16 0521 05/23/16 0521  WBC 5.1 5.3  HGB 13.0 13.3  HCT 38.8 38.5  PLT 192 192   CMET CMP     Component Value Date/Time   NA 142 05/22/2016 0521   K  3.5 05/22/2016 0521   CL 109 05/22/2016 0521   CO2 23 05/22/2016 0521   GLUCOSE 67 05/22/2016 0521   BUN 11 05/22/2016 0521   CREATININE 0.73 05/22/2016 0521   CALCIUM 9.1 05/22/2016 0521   GFRNONAA >60 05/22/2016 0521   GFRAA >60 05/22/2016 0521    GFR Estimated Creatinine Clearance: 41.1 mL/min (by C-G formula based on SCr of 0.73 mg/dL). No results for input(s): LIPASE, AMYLASE in the last 72 hours. No results for input(s): CKTOTAL, CKMB, CKMBINDEX, TROPONINI in the last 72 hours. Invalid input(s): POCBNP No results for input(s): DDIMER in the last 72 hours. No results for input(s): HGBA1C in the last 72 hours. No results for input(s): CHOL, HDL, LDLCALC, TRIG, CHOLHDL, LDLDIRECT in the last 72 hours.  Recent Labs  05/22/16 1012  TSH 1.512   No results for input(s): VITAMINB12, FOLATE, FERRITIN, TIBC, IRON, RETICCTPCT in the last 72 hours. Coags:  Recent Labs  05/22/16 0155  INR 1.10   Microbiology: No results found for this or any previous visit (from the past 240 hour(s)).  FURTHER DISCHARGE INSTRUCTIONS:  Get Medicines reviewed and adjusted: Please take all your medications with you for your next visit with your Primary MD  Laboratory/radiological data: Please request your Primary MD to go over all hospital tests and procedure/radiological results at the follow up, please ask your Primary MD to get all Hospital records sent to his/her office.  In some cases, they will be blood work, cultures and biopsy results pending at the time of your discharge. Please request that your primary care M.D. goes through all the records of your hospital data and follows up on these results.  Also Note the following: If you experience worsening of your admission symptoms, develop shortness of breath, life threatening emergency, suicidal or homicidal thoughts you must seek medical attention immediately by calling 911 or calling your MD immediately  if symptoms less severe.  You  must read complete instructions/literature along with all the possible adverse reactions/side effects for all the Medicines you take and that have been prescribed to you. Take any new Medicines after you have completely understood and accpet all the possible adverse reactions/side effects.   Do not drive when taking Pain medications or sleeping medications (Benzodaizepines)  Do not take more than prescribed Pain, Sleep and Anxiety Medications. It is not advisable to combine anxiety,sleep and pain medications without talking with your primary care practitioner  Special Instructions: If you have smoked or chewed Tobacco  in the last 2 yrs please stop smoking, stop any regular Alcohol  and or any Recreational drug use.  Wear Seat belts while driving.  Please note: You were cared for by a hospitalist during your hospital stay. Once you are discharged, your primary care physician will handle any further medical issues. Please note that NO REFILLS for any discharge medications will be authorized once you are discharged, as it is imperative that you return to your primary care physician (or establish a relationship with  a primary care physician if you do not have one) for your post hospital discharge needs so that they can reassess your need for medications and monitor your lab values.  Total Time spent coordinating discharge including counseling, education and face to face time equals  45 minutes.  SignedJeoffrey Massed: Yolanda Hopkins 05/24/2016 11:53 AM

## 2016-10-12 NOTE — Addendum Note (Signed)
Addendum  created 10/12/16 0806 by Jamar Casagrande, MD   Sign clinical note    

## 2019-04-18 DIAGNOSIS — W19XXXA Unspecified fall, initial encounter: Secondary | ICD-10-CM | POA: Insufficient documentation

## 2019-04-18 DIAGNOSIS — F0390 Unspecified dementia without behavioral disturbance: Secondary | ICD-10-CM | POA: Insufficient documentation

## 2019-04-18 DIAGNOSIS — N3000 Acute cystitis without hematuria: Secondary | ICD-10-CM | POA: Insufficient documentation

## 2019-04-18 DIAGNOSIS — S72001A Fracture of unspecified part of neck of right femur, initial encounter for closed fracture: Secondary | ICD-10-CM | POA: Insufficient documentation

## 2019-07-30 ENCOUNTER — Emergency Department (HOSPITAL_COMMUNITY): Payer: Medicare Other

## 2019-07-30 ENCOUNTER — Inpatient Hospital Stay (HOSPITAL_COMMUNITY)
Admission: EM | Admit: 2019-07-30 | Discharge: 2019-08-05 | DRG: 065 | Disposition: A | Discharge status: Rehabilitation Facility | Payer: Medicare Other | Attending: Internal Medicine | Admitting: Internal Medicine

## 2019-07-30 ENCOUNTER — Encounter (HOSPITAL_COMMUNITY): Payer: Self-pay | Admitting: Emergency Medicine

## 2019-07-30 ENCOUNTER — Other Ambulatory Visit: Payer: Self-pay

## 2019-07-30 DIAGNOSIS — E785 Hyperlipidemia, unspecified: Secondary | ICD-10-CM | POA: Diagnosis present

## 2019-07-30 DIAGNOSIS — N3942 Incontinence without sensory awareness: Secondary | ICD-10-CM | POA: Diagnosis not present

## 2019-07-30 DIAGNOSIS — Z8249 Family history of ischemic heart disease and other diseases of the circulatory system: Secondary | ICD-10-CM | POA: Diagnosis not present

## 2019-07-30 DIAGNOSIS — I639 Cerebral infarction, unspecified: Secondary | ICD-10-CM | POA: Diagnosis present

## 2019-07-30 DIAGNOSIS — Z91018 Allergy to other foods: Secondary | ICD-10-CM

## 2019-07-30 DIAGNOSIS — F8081 Childhood onset fluency disorder: Secondary | ICD-10-CM | POA: Diagnosis present

## 2019-07-30 DIAGNOSIS — Z7902 Long term (current) use of antithrombotics/antiplatelets: Secondary | ICD-10-CM | POA: Diagnosis not present

## 2019-07-30 DIAGNOSIS — Z96641 Presence of right artificial hip joint: Secondary | ICD-10-CM | POA: Diagnosis present

## 2019-07-30 DIAGNOSIS — Z8679 Personal history of other diseases of the circulatory system: Secondary | ICD-10-CM | POA: Diagnosis not present

## 2019-07-30 DIAGNOSIS — Z7982 Long term (current) use of aspirin: Secondary | ICD-10-CM

## 2019-07-30 DIAGNOSIS — F17210 Nicotine dependence, cigarettes, uncomplicated: Secondary | ICD-10-CM | POA: Diagnosis present

## 2019-07-30 DIAGNOSIS — F329 Major depressive disorder, single episode, unspecified: Secondary | ICD-10-CM | POA: Diagnosis present

## 2019-07-30 DIAGNOSIS — Z79899 Other long term (current) drug therapy: Secondary | ICD-10-CM

## 2019-07-30 DIAGNOSIS — F0150 Vascular dementia without behavioral disturbance: Secondary | ICD-10-CM | POA: Diagnosis present

## 2019-07-30 DIAGNOSIS — G8191 Hemiplegia, unspecified affecting right dominant side: Secondary | ICD-10-CM | POA: Diagnosis present

## 2019-07-30 DIAGNOSIS — I10 Essential (primary) hypertension: Secondary | ICD-10-CM | POA: Diagnosis present

## 2019-07-30 DIAGNOSIS — Z20822 Contact with and (suspected) exposure to covid-19: Secondary | ICD-10-CM | POA: Diagnosis present

## 2019-07-30 DIAGNOSIS — R159 Full incontinence of feces: Secondary | ICD-10-CM | POA: Diagnosis present

## 2019-07-30 DIAGNOSIS — Z8673 Personal history of transient ischemic attack (TIA), and cerebral infarction without residual deficits: Secondary | ICD-10-CM

## 2019-07-30 DIAGNOSIS — I6389 Other cerebral infarction: Secondary | ICD-10-CM | POA: Diagnosis not present

## 2019-07-30 DIAGNOSIS — N390 Urinary tract infection, site not specified: Secondary | ICD-10-CM | POA: Diagnosis present

## 2019-07-30 DIAGNOSIS — R32 Unspecified urinary incontinence: Secondary | ICD-10-CM | POA: Diagnosis present

## 2019-07-30 DIAGNOSIS — G20 Parkinson's disease: Secondary | ICD-10-CM | POA: Diagnosis present

## 2019-07-30 DIAGNOSIS — R262 Difficulty in walking, not elsewhere classified: Secondary | ICD-10-CM | POA: Diagnosis present

## 2019-07-30 DIAGNOSIS — I69351 Hemiplegia and hemiparesis following cerebral infarction affecting right dominant side: Secondary | ICD-10-CM | POA: Diagnosis present

## 2019-07-30 DIAGNOSIS — K5901 Slow transit constipation: Secondary | ICD-10-CM | POA: Diagnosis present

## 2019-07-30 DIAGNOSIS — I63522 Cerebral infarction due to unspecified occlusion or stenosis of left anterior cerebral artery: Secondary | ICD-10-CM | POA: Diagnosis not present

## 2019-07-30 DIAGNOSIS — E782 Mixed hyperlipidemia: Secondary | ICD-10-CM | POA: Diagnosis not present

## 2019-07-30 DIAGNOSIS — R150 Incomplete defecation: Secondary | ICD-10-CM | POA: Diagnosis not present

## 2019-07-30 DIAGNOSIS — R29701 NIHSS score 1: Secondary | ICD-10-CM | POA: Diagnosis present

## 2019-07-30 DIAGNOSIS — Z9102 Food additives allergy status: Secondary | ICD-10-CM | POA: Diagnosis not present

## 2019-07-30 DIAGNOSIS — Z9181 History of falling: Secondary | ICD-10-CM | POA: Diagnosis not present

## 2019-07-30 DIAGNOSIS — R350 Frequency of micturition: Secondary | ICD-10-CM | POA: Diagnosis present

## 2019-07-30 DIAGNOSIS — F0390 Unspecified dementia without behavioral disturbance: Secondary | ICD-10-CM | POA: Diagnosis present

## 2019-07-30 DIAGNOSIS — I69311 Memory deficit following cerebral infarction: Secondary | ICD-10-CM | POA: Diagnosis not present

## 2019-07-30 DIAGNOSIS — F17200 Nicotine dependence, unspecified, uncomplicated: Secondary | ICD-10-CM | POA: Diagnosis not present

## 2019-07-30 DIAGNOSIS — Z91048 Other nonmedicinal substance allergy status: Secondary | ICD-10-CM | POA: Diagnosis not present

## 2019-07-30 DIAGNOSIS — Z800 Family history of malignant neoplasm of digestive organs: Secondary | ICD-10-CM | POA: Diagnosis not present

## 2019-07-30 DIAGNOSIS — I6522 Occlusion and stenosis of left carotid artery: Secondary | ICD-10-CM | POA: Diagnosis present

## 2019-07-30 DIAGNOSIS — Z882 Allergy status to sulfonamides status: Secondary | ICD-10-CM | POA: Diagnosis not present

## 2019-07-30 DIAGNOSIS — R413 Other amnesia: Secondary | ICD-10-CM | POA: Diagnosis present

## 2019-07-30 LAB — URINALYSIS, ROUTINE W REFLEX MICROSCOPIC
Bacteria, UA: NONE SEEN
Bilirubin Urine: NEGATIVE
Glucose, UA: NEGATIVE mg/dL
Hgb urine dipstick: NEGATIVE
Ketones, ur: NEGATIVE mg/dL
Nitrite: NEGATIVE
Protein, ur: NEGATIVE mg/dL
Specific Gravity, Urine: 1.008 (ref 1.005–1.030)
pH: 6 (ref 5.0–8.0)

## 2019-07-30 LAB — CBC
HCT: 43.7 % (ref 36.0–46.0)
Hemoglobin: 14.2 g/dL (ref 12.0–15.0)
MCH: 32.1 pg (ref 26.0–34.0)
MCHC: 32.5 g/dL (ref 30.0–36.0)
MCV: 98.9 fL (ref 80.0–100.0)
Platelets: 325 10*3/uL (ref 150–400)
RBC: 4.42 MIL/uL (ref 3.87–5.11)
RDW: 13.2 % (ref 11.5–15.5)
WBC: 5.9 10*3/uL (ref 4.0–10.5)
nRBC: 0 % (ref 0.0–0.2)

## 2019-07-30 LAB — BASIC METABOLIC PANEL
Anion gap: 13 (ref 5–15)
BUN: 24 mg/dL — ABNORMAL HIGH (ref 8–23)
CO2: 26 mmol/L (ref 22–32)
Calcium: 10.1 mg/dL (ref 8.9–10.3)
Chloride: 101 mmol/L (ref 98–111)
Creatinine, Ser: 0.86 mg/dL (ref 0.44–1.00)
GFR calc Af Amer: >60 mL/min (ref >60)
GFR calc non Af Amer: >60 mL/min (ref >60)
Glucose, Bld: 97 mg/dL (ref 70–99)
Potassium: 4 mmol/L (ref 3.5–5.1)
Sodium: 140 mmol/L (ref 135–145)

## 2019-07-30 MED ORDER — SODIUM CHLORIDE 0.9 % IV BOLUS
500.0000 mL | Freq: Once | INTRAVENOUS | Status: AC
  Administered 2019-07-30: 20:00:00 500 mL via INTRAVENOUS

## 2019-07-30 MED ORDER — ACETAMINOPHEN 325 MG PO TABS
650.0000 mg | ORAL_TABLET | Freq: Once | ORAL | Status: AC
  Administered 2019-07-30: 650 mg via ORAL
  Filled 2019-07-30: qty 2

## 2019-07-30 NOTE — ED Notes (Signed)
Pt ambulated with assistance and a walker. Pt seemed to be favoring her right leg and right foot was rotated outward. Pt took small steps always leading with her left foot.

## 2019-07-30 NOTE — ED Triage Notes (Signed)
Pt arrives with son. Pt states she broke her hip 2 years ago and has had problems with her legs ever since. Pt denies leg pain but states her rectum hurts.

## 2019-07-30 NOTE — ED Provider Notes (Signed)
[facility] EMERGENCY DEPARTMENT Provider Note   CSN: [id number] Arrival date & time: 07/30/19  1722  LEVEL 5 CAVEAT - DEMENTIA  History Chief Complaint  Patient presents with  . Weakness    Yolanda Hopkins is a 79 y.o. female.  HPI 79 year old female presents with falls.  History is taken by son at the bedside.  She has dementia.  Today, the patient has had 2 falls, once while going to the bathroom and once while walking in the hallway.  Both times it seemed like she was stuttering her feet and falling backwards.  One time was with a walker.  When walking later it looked like she might be dragging her right leg, he was unclear if this was from pain or weakness.  Patient has had no complaints or recent illness. Son states she also seems liked she's getting some bruising to her tailbone from sitting on the toilet for so long   Past Medical History:  Diagnosis Date  . Hypertension   . Tobacco abuse 08/19/2013  . Vertigo     Patient Active Problem List   Diagnosis Date Noted  . Acute CVA (cerebrovascular accident) (HCC) 07/30/2019  . GI bleed 05/22/2016  . Pressure injury of skin 05/22/2016  . Loss of weight   . Dyslipidemia   . Stroke (HCC) 08/19/2013  . HTN (hypertension) 08/19/2013  . Tobacco abuse 08/19/2013  . Vertigo 08/19/2013  . Gait abnormality 08/19/2013  . CVA (cerebral infarction) 08/19/2013    Past Surgical History:  Procedure Laterality Date  . COLONOSCOPY N/A 05/23/2016   Procedure: COLONOSCOPY;  Surgeon: Jeani Hawking, MD;  Location: [facility] ENDOSCOPY;  Service: Endoscopy;  Laterality: N/A;  . ESOPHAGOGASTRODUODENOSCOPY N/A 05/23/2016   Procedure: ESOPHAGOGASTRODUODENOSCOPY (EGD);  Surgeon: Jeani Hawking, MD;  Location: [facility] ENDOSCOPY;  Service: Endoscopy;  Laterality: N/A;  . GIVENS CAPSULE STUDY N/A 05/23/2016   Procedure: GIVENS CAPSULE STUDY;  Surgeon: Jeani Hawking, MD;  Location: [facility] ENDOSCOPY;  Service: Endoscopy;  Laterality: N/A;     OB  History   No obstetric history on file.     Family History  Problem Relation Age of Onset  . Hypertension Mother   . Hypertension Father   . Liver cancer Father     Social History   Tobacco Use  . Smoking status: Current Some Day Smoker    Types: Cigarettes  . Smokeless tobacco: Never Used  Substance Use Topics  . Alcohol use: No  . Drug use: No    Home Medications Prior to Admission medications   Medication Sig Start Date End Date Taking? Authorizing Provider  Ibuprofen-diphenhydrAMINE Cit (ADVIL PM) 200-38 MG TABS Take 1 tablet by mouth at bedtime as needed (for sleep).   Yes [provider]  mirtazapine (REMERON) 15 MG tablet Take 15 mg by mouth at bedtime. 06/11/19  Yes [provider]  rosuvastatin (CRESTOR) 10 MG tablet Take 1 tablet (10 mg total) by mouth daily. Patient not taking: Reported on 07/31/2019 08/20/13   Edsel Petrin, DO    Allergies    Mango flavor, Poison ivy extract, and Sulfa antibiotics  Review of Systems   Review of Systems  Unable to perform ROS: Dementia    Physical Exam Updated Vital Signs BP 139/67   Pulse 80   Temp 98.5 F (36.9 C) (Oral)   Resp (!) 21   SpO2 98%   Physical Exam Vitals and nursing note reviewed.  Constitutional:      Appearance: She is well-developed.  HENT:     Head: Normocephalic and atraumatic.     Right Ear: External ear normal.     Left Ear: External ear normal.     Nose: Nose normal.  Eyes:     General:        Right eye: No discharge.        Left eye: No discharge.  Cardiovascular:     Rate and Rhythm: Normal rate and regular rhythm.     Heart sounds: Normal heart sounds.  Pulmonary:     Effort: Pulmonary effort is normal.     Breath sounds: Normal breath sounds.  Abdominal:     Palpations: Abdomen is soft.     Tenderness: There is no abdominal tenderness.  Musculoskeletal:     Right hip: No deformity or tenderness. Normal range of motion.     Right upper leg: No swelling or  tenderness.     Right knee: No swelling. Normal range of motion.       Legs:  Skin:    General: Skin is warm and dry.  Neurological:     Mental Status: She is alert. She is disoriented.     Comments: Awake, alert, but disoriented.  5/5 strength in all 4 extremities. Grossly normal sensation.  Psychiatric:        Mood and Affect: Mood is not anxious.     ED Results / Procedures / Treatments   Labs (all labs ordered are listed, but only abnormal results are displayed) Labs Reviewed  BASIC METABOLIC PANEL - Abnormal; Notable for the following components:      Result Value   BUN 24 (*)    All other components within normal limits  URINALYSIS, ROUTINE W REFLEX MICROSCOPIC - Abnormal; Notable for the following components:   Color, Urine STRAW (*)    Leukocytes,Ua MODERATE (*)    All other components within normal limits  URINE CULTURE  SARS CORONAVIRUS 2 (TAT 6-24 HRS)  CBC    EKG EKG Interpretation  Date/Time:  Friday July 30 2019 17:28:23 EDT Ventricular Rate:  89 PR Interval:  148 QRS Duration: 80 QT Interval:  356 QTC Calculation: 433 R Axis:   -18 Text Interpretation: Normal sinus rhythm Right atrial enlargement Low voltage QRS Borderline ECG no significant change since 2015 Confirmed by Pricilla Loveless [phone]) on 07/30/2019 7:22:33 PM   Radiology CT Head Wo Contrast  Result Date: 07/30/2019 CLINICAL DATA:  Weakness EXAM: CT HEAD WITHOUT CONTRAST TECHNIQUE: Contiguous axial images were obtained from the base of the skull through the vertex without intravenous contrast. COMPARISON:  CT brain 08/19/2013 FINDINGS: Brain: No acute territorial infarction, hemorrhage, or intracranial mass. Mild to moderate atrophy. Moderate hypodensity in the white matter consistent with chronic small vessel ischemic change. Chronic lacunar infarcts within the bilateral basal ganglia. Stable ventricle size Vascular: No hyperdense vessels.  Carotid vascular calcification Skull: Normal. Negative  for fracture or focal lesion. Sinuses/Orbits: Mucosal thickening in the sinuses. Sclerosis and bony wall thickening of the left sphenoid sinus consistent with chronic disease. Other: None IMPRESSION: 1. No CT evidence for acute intracranial abnormality. 2. Atrophy and chronic small vessel ischemic change of the white matter Electronically Signed   By: Jasmine Pang M.D.   On: 07/30/2019 20:35   MR BRAIN WO CONTRAST  Result Date: 07/30/2019 CLINICAL DATA:  Dementia and difficulty walking EXAM: MRI HEAD WITHOUT CONTRAST TECHNIQUE: Multiplanar, multiecho pulse sequences of the brain and surrounding structures were obtained without intravenous contrast. The examination had to be  discontinued prior to completion due to the patient's altered mental status and inability to cooperate with technologist's instructions. Eight series are provided for interpretation. COMPARISON:  Brain MRI 08/19/2013 FINDINGS: Brain: Multifocal abnormal diffusion restriction within the left frontal white matter, within the anterior cerebral artery territory. Punctate focus of hyperintensity on diffusion-weighted imaging (axial) in the right cerebellar hemisphere may be artifactual. Multifocal white matter hyperintensity, most commonly due to chronic ischemic microangiopathy. There are multiple old small vessel infarcts of the basal ganglia and cerebellum. There is generalized atrophy without lobar predilection. There are multiple chronic microhemorrhages concentrated within the left parietal lobe. Vascular: Normal flow voids. Skull and upper cervical spine: Normal marrow signal. Sinuses/Orbits: Negative. Other: None. IMPRESSION: 1. Multiple small acute infarcts within the left frontal white matter, in the left anterior cerebral artery territory. 2. Punctate focus of hyperintensity on diffusion-weighted imaging in the right cerebellar hemisphere may be artifactual. 3. Generalized atrophy, without lobar predilection. 4. Multiple old small  vessel infarcts of the basal ganglia and cerebellum. 5. Multiple chronic microhemorrhages concentrated within the left parietal lobe. Electronically Signed   By: Ulyses Jarred M.D.   On: 07/30/2019 23:41   DG Hip Unilat W or Wo Pelvis 2-3 Views Right  Result Date: 07/30/2019 CLINICAL DATA:  Fall, leg injury EXAM: DG HIP (WITH OR WITHOUT PELVIS) 2-3V RIGHT COMPARISON:  None. FINDINGS: The osseous structures appear diffusely demineralized which may limit detection of small or nondisplaced fractures. Bones of the pelvis appear intact and congruent. A right hip hemiarthroplasty appears normally aligned without evidence of periprosthetic fracture, hardware failure or lucency. Mild right hip swelling. Left femoral head is normally located. No proximal left femoral fracture is seen. Vascular calcium noted in the pelvis. Multilevel degenerative changes are present in the imaged portions of the spine. Additional degenerative changes in the SI joints and symphysis pubis IMPRESSION: Mild right hip soft tissue swelling. No acute osseous abnormality or evidence acute prosthetic complication of the patient's right hip hemiarthroplasty. Electronically Signed   By: Lovena Le M.D.   On: 07/30/2019 20:51    Procedures Procedures (including critical care time)  Medications Ordered in ED Medications  sodium chloride 0.9 % bolus 500 mL (0 mLs Intravenous Stopped 07/30/19 2158)  acetaminophen (TYLENOL) tablet 650 mg (650 mg Oral Given 07/30/19 2159)    ED Course  I have reviewed the triage vital signs and the nursing notes.  Pertinent labs & imaging results that were available during my care of the patient were reviewed by me and considered in my medical decision making (see chart for details).  Clinical Course as of Jul 31 7  Fri Jul 30, 2019  2133 Initial work-up is pretty unremarkable.  Patient was ambulated with a walker and seemed to have her right foot everted.  To the tech, it seemed like she was in pain  or having discomfort.  When talking to the patient and son, the patient tells me it felt like a "mop" when she was walking.  Sounds like it was heavy.  Given she is ambulatory, doubt occult fracture.  Will get MRI to rule out occult stroke.   [SG]    Clinical Course User Index [SG] Sherwood Gambler, MD   MDM Rules/Calculators/A&P                      Patient's MRI shows acute stroke. Time of onset is unclear so not a candidate for TPA/code stroke. Neurology will be consulted. Dr. Hal Hope to admit.  Final Clinical Impression(s) / ED Diagnoses Final diagnoses:  Acute CVA (cerebrovascular accident) [facility])    Rx / DC Orders ED Discharge Orders         Ordered    Home Health     07/30/19 2157    Face-to-face encounter (required for Medicare/Medicaid patients)    Comments: I Audree Camel certify that this patient is under my care and that I, or a nurse practitioner or physician's assistant working with me, had a face-to-face encounter that meets the physician face-to-face encounter requirements with this patient on 07/30/2019. The encounter with the patient was in whole, or in part for the following medical condition(s) which is the primary reason for home health care (List medical condition): right leg pain   07/30/19 2157           Pricilla Loveless, MD 07/31/19 0010

## 2019-07-31 ENCOUNTER — Inpatient Hospital Stay (HOSPITAL_COMMUNITY): Payer: Medicare Other

## 2019-07-31 ENCOUNTER — Encounter (HOSPITAL_COMMUNITY): Payer: Self-pay | Admitting: Internal Medicine

## 2019-07-31 LAB — CBC
HCT: 44.4 % (ref 36.0–46.0)
Hemoglobin: 14.5 g/dL (ref 12.0–15.0)
MCH: 31.9 pg (ref 26.0–34.0)
MCHC: 32.7 g/dL (ref 30.0–36.0)
MCV: 97.8 fL (ref 80.0–100.0)
Platelets: 277 10*3/uL (ref 150–400)
RBC: 4.54 MIL/uL (ref 3.87–5.11)
RDW: 13.3 % (ref 11.5–15.5)
WBC: 4.9 10*3/uL (ref 4.0–10.5)
nRBC: 0 % (ref 0.0–0.2)

## 2019-07-31 LAB — CREATININE, SERUM
Creatinine, Ser: 0.61 mg/dL (ref 0.44–1.00)
GFR calc Af Amer: >60 mL/min (ref >60)
GFR calc non Af Amer: >60 mL/min (ref >60)

## 2019-07-31 LAB — SARS CORONAVIRUS 2 (TAT 6-24 HRS): SARS Coronavirus 2: NEGATIVE

## 2019-07-31 LAB — LIPID PANEL
Cholesterol: 233 mg/dL — ABNORMAL HIGH (ref 0–200)
HDL: 64 mg/dL (ref >40)
LDL Cholesterol: 158 mg/dL — ABNORMAL HIGH (ref 0–99)
Total CHOL/HDL Ratio: 3.6 RATIO
Triglycerides: 53 mg/dL (ref <150)
VLDL: 11 mg/dL (ref 0–40)

## 2019-07-31 LAB — HEMOGLOBIN A1C
Hgb A1c MFr Bld: 5.3 % (ref 4.8–5.6)
Mean Plasma Glucose: 105.41 mg/dL

## 2019-07-31 MED ORDER — CLOPIDOGREL BISULFATE 75 MG PO TABS: 75.0000 mg | ORAL_TABLET | Freq: Every day | ORAL | Status: DC

## 2019-07-31 MED ORDER — ATORVASTATIN CALCIUM 40 MG PO TABS
40.0000 mg | ORAL_TABLET | Freq: Every day | ORAL | Status: DC
  Administered 2019-07-31 – 2019-08-04 (×5): 40 mg via ORAL
  Filled 2019-07-31 (×6): qty 1

## 2019-07-31 MED ORDER — ACETAMINOPHEN 160 MG/5ML PO SOLN: 650.0000 mg | ORAL | Status: DC | PRN

## 2019-07-31 MED ORDER — IOHEXOL 350 MG/ML SOLN
75.0000 mL | Freq: Once | INTRAVENOUS | Status: AC | PRN
  Administered 2019-07-31: 75 mL via INTRAVENOUS

## 2019-07-31 MED ORDER — ACETAMINOPHEN 650 MG RE SUPP: 650.0000 mg | RECTAL | Status: DC | PRN

## 2019-07-31 MED ORDER — ASPIRIN EC 81 MG PO TBEC
81.0000 mg | DELAYED_RELEASE_TABLET | Freq: Every day | ORAL | Status: DC
  Administered 2019-07-31: 81 mg via ORAL
  Filled 2019-07-31: qty 1

## 2019-07-31 MED ORDER — MIRTAZAPINE 15 MG PO TABS
15.0000 mg | ORAL_TABLET | Freq: Every day | ORAL | Status: DC
  Administered 2019-07-31 – 2019-08-01 (×3): 15 mg via ORAL
  Filled 2019-07-31 (×3): qty 1

## 2019-07-31 MED ORDER — ASPIRIN 300 MG RE SUPP: 300.0000 mg | Freq: Every day | RECTAL | Status: DC

## 2019-07-31 MED ORDER — CLOPIDOGREL BISULFATE 75 MG PO TABS
75.0000 mg | ORAL_TABLET | Freq: Every day | ORAL | Status: DC
  Administered 2019-07-31 – 2019-08-05 (×7): 75 mg via ORAL
  Filled 2019-07-31 (×8): qty 1

## 2019-07-31 MED ORDER — ASPIRIN EC 81 MG PO TBEC: 81.0000 mg | DELAYED_RELEASE_TABLET | Freq: Every day | ORAL | Status: DC

## 2019-07-31 MED ORDER — ENOXAPARIN SODIUM 40 MG/0.4ML ~~LOC~~ SOLN
40.0000 mg | SUBCUTANEOUS | Status: DC
  Administered 2019-07-31 – 2019-08-04 (×6): 40 mg via SUBCUTANEOUS
  Filled 2019-07-31 (×6): qty 0.4

## 2019-07-31 MED ORDER — STROKE: EARLY STAGES OF RECOVERY BOOK: Freq: Once | Status: AC

## 2019-07-31 MED ORDER — SODIUM CHLORIDE 0.9 % IV SOLN
1.0000 g | INTRAVENOUS | Status: DC
  Administered 2019-07-31 – 2019-08-05 (×6): 1 g via INTRAVENOUS
  Filled 2019-07-31 (×6): qty 10

## 2019-07-31 MED ORDER — NICOTINE 7 MG/24HR TD PT24
7.0000 mg | MEDICATED_PATCH | Freq: Every day | TRANSDERMAL | Status: DC
  Administered 2019-07-31 – 2019-08-05 (×6): 7 mg via TRANSDERMAL
  Filled 2019-07-31 (×6): qty 1

## 2019-07-31 MED ORDER — ASPIRIN 325 MG PO TABS: 325.0000 mg | ORAL_TABLET | Freq: Every day | ORAL | Status: DC

## 2019-07-31 MED ORDER — SODIUM CHLORIDE 0.9 % IV SOLN: INTRAVENOUS | Status: AC

## 2019-07-31 MED ORDER — ACETAMINOPHEN 325 MG PO TABS: 650.0000 mg | ORAL_TABLET | ORAL | Status: DC | PRN

## 2019-07-31 MED ORDER — ASPIRIN EC 325 MG PO TBEC
325.0000 mg | DELAYED_RELEASE_TABLET | Freq: Every day | ORAL | Status: DC
  Administered 2019-07-31 – 2019-08-05 (×6): 325 mg via ORAL
  Filled 2019-07-31 (×6): qty 1

## 2019-07-31 NOTE — Evaluation (Signed)
Physical Therapy Evaluation Patient Details Name: Yolanda Hopkins MRN: [id number] DOB: 1940/10/07 Today's Date: 07/31/2019   History of Present Illness  Pt is an 79 y.o. female admitted 07/30/19 after son noted pt dragging RLE was confused and had two falls. 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 Possible UTI. R hip imaging without fx. PMH includes recent R hip fx (04/2019), HTN, vertigo, depression.    Clinical Impression  Pt presents with an overall decrease in functional mobility secondary to above. Today, pt poor historian, only answering yes/no minimally to questions; unable to reach son to verify information; per chart, was ambulatory at home. Pt demonstrates impaired cognition and communication difficulties; difficult to assess extent of true cognitive impairment. BLE weakness with R-side weaker. Feel pt would benefit from intensive CIR-level therapies to maximize functional mobility and independence prior to return home.    Follow Up Recommendations CIR;Supervision/Assistance - 24 hour    Equipment Recommendations  (TBD)    Recommendations for Other Services       Precautions / Restrictions Precautions Precautions: Fall Precaution Comments: cognitive deficits Restrictions Weight Bearing Restrictions: No      Mobility  Bed Mobility Overal bed mobility: Needs Assistance Bed Mobility: Supine to Sit     Supine to sit: Mod assist     General bed mobility comments: Pt initiating movement but then quickly stopping, requiring max, multimodal cues to participate, modA to initiate further movement by scooting hips to EOB, pt reaching for UE support; modA for trunk elevation  Transfers Overall transfer level: Needs assistance Equipment used: 1 person hand held assist Transfers: Sit to/from Stand Sit to Stand: Mod assist;Max assist;+2 safety/equipment         General transfer comment: Pt  initiating standing, significant extension posture with heavy reliance on UE support and mod-maxA to elevate trnk and cue forward weight translation standing from EOB; cues to use hand rails standing from recliner, able to stand with modA  Ambulation/Gait Ambulation/Gait assistance: Mod assist;+2 safety/equipment Gait Distance (Feet): 4 Feet Assistive device: 1 person hand held assist Gait Pattern/deviations: Step-to pattern;Shuffle;Trunk flexed;Leaning posteriorly;Decreased dorsiflexion - right Gait velocity: Decreased Gait velocity interpretation: <1.31 ft/sec, indicative of household ambulator General Gait Details: Slow, unsteady steps with HHA and modA to maintain balance; shuffling gait with RLE lagging behind, difficulty taking complete step to progress RLE forwards or backwards despite max, multimodal cues  Stairs            Wheelchair Mobility    Modified Rankin (Stroke Patients Only) Modified Rankin (Stroke Patients Only) Pre-Morbid Rankin Score: Slight disability Modified Rankin: Moderately severe disability     Balance Overall balance assessment: Needs assistance   Sitting balance-Leahy Scale: Fair       Standing balance-Leahy Scale: Poor                               Pertinent Vitals/Pain Pain Assessment: Faces Faces Pain Scale: No hurt Pain Intervention(s): Monitored during session    Home Living Family/patient expects to be discharged to:: Private residence                 Additional Comments: Pt unable to provide info; attempted to call son Cristal Deer) but no answer    Prior Function           Comments: Per chart, pt was walking with walker, 2x falls leading to  admission. Pt poor historian; attempted to call son who did not answer     Hand Dominance   Dominant Hand: Right    Extremity/Trunk Assessment   Upper Extremity Assessment Upper Extremity Assessment: Generalized weakness;Difficult to assess due to impaired  cognition    Lower Extremity Assessment Lower Extremity Assessment: RLE deficits/detail;LLE deficits/detail;Difficult to assess due to impaired cognition RLE Deficits / Details: Functionally <3/5 throughout; pt not following commands for formal strength testing, would wiggle toes LLE Deficits / Details: Functional </= 3/5; pt not following commands for formal strength testing       Communication   Communication: Expressive difficulties;Receptive difficulties(unsure extent)  Cognition Arousal/Alertness: Awake/alert Behavior During Therapy: Flat affect Overall Cognitive Status: Impaired/Different from baseline Area of Impairment: Orientation;Attention;Following commands;Awareness;Problem solving                 Orientation Level: Disoriented to;Place;Time;Situation Current Attention Level: Focused;Sustained   Following Commands: Follows one step commands inconsistently;Follows one step commands with increased time   Awareness: Intellectual   General Comments: Answering some questions yes/no seemingly appropriate, but mostly not talking. Easily distracted. Followed <50% simple commands. Difficult to determine expressive/receptive difficulties versus baseline cognitive impairment      General Comments General comments (skin integrity, edema, etc.): Attempted to call son Harrell Gave- # in chart), but no answer    Exercises     Assessment/Plan    PT Assessment Patient needs continued PT services  PT Problem List Decreased strength;Decreased activity tolerance;Decreased balance;Decreased mobility;Decreased cognition;Decreased knowledge of use of DME       PT Treatment Interventions DME instruction;Gait training;Stair training;Functional mobility training;Therapeutic activities;Therapeutic exercise;Balance training;Cognitive remediation;Patient/family education;Neuromuscular re-education    PT Goals (Current goals can be found in the Care Plan section)  Acute Rehab PT  Goals Patient Stated Goal: None stated PT Goal Formulation: With patient Time For Goal Achievement: 08/14/19 Potential to Achieve Goals: Fair    Frequency Min 4X/week   Barriers to discharge        Co-evaluation PT/OT/SLP Co-Evaluation/Treatment: Yes Reason for Co-Treatment: Necessary to address cognition/behavior during functional activity;For patient/therapist safety;To address functional/ADL transfers PT goals addressed during session: Mobility/safety with mobility;Balance         AM-PAC PT "6 Clicks" Mobility  Outcome Measure Help needed turning from your back to your side while in a flat bed without using bedrails?: A Lot Help needed moving from lying on your back to sitting on the side of a flat bed without using bedrails?: A Lot Help needed moving to and from a bed to a chair (including a wheelchair)?: A Lot Help needed standing up from a chair using your arms (e.g., wheelchair or bedside chair)?: A Lot Help needed to walk in hospital room?: A Lot Help needed climbing 3-5 steps with a railing? : Total 6 Click Score: 11    End of Session Equipment Utilized During Treatment: Gait belt Activity Tolerance: Patient tolerated treatment well Patient left: in chair;with call bell/phone within reach;with chair alarm set Nurse Communication: Mobility status PT Visit Diagnosis: Other abnormalities of gait and mobility (R26.89);Muscle weakness (generalized) (M62.81);Other symptoms and signs involving the nervous system (R29.898)    Time: 5573-2202 PT Time Calculation (min) (ACUTE ONLY): 22 min   Charges:   PT Evaluation $PT Eval Moderate Complexity: [place], PT, DPT Acute Rehabilitation Services  Pager [phone] Office [place] 07/31/2019, 1:52 PM

## 2019-07-31 NOTE — Progress Notes (Signed)
PROGRESS NOTE  Yolanda Hopkins GYJ:[id number] DOB: 1940-09-17 DOA: 07/30/2019 PCP: [facility]  Brief History    Yolanda Hopkins is a 79 y.o. female with history of previous stroke presently only taking Remeron for depression was brought to the ER after patient's son noted that patient has been having difficulty walking with patient dragging her right lower extremity since yesterday morning.  Over the last 2 days patient has been having increasing frequency of urination and appeared to be mildly confused.  Yesterday morning around 2 AM patient went to the bathroom to urinate and had a fall but did not lose consciousness and again had another fall 2 hours later.  From that point patient was found to have increasing difficulty walking dragging her right lower extremity and was using a walker.  Patient has been with social service from December to 06/13/22 and was back to the family in Jun 13, 2022.  In 06-13-2022 patient's husband had died.  Since then patient has been some depression and was restarted on Remeron.  Patient also had a fracture of the right hip in December 2020 which was managed surgically.  ED Course: In the ER on exam patient is able to move all extremities with mild weakness of the right lower extremity.  Able to walk oriented to name and place.  MRI of the brain shows multiple embolic areas in the left frontal and the right cerebellar area.  EKG shows normal sinus rhythm.  On-call neurologist has been consulted admit for further management of acute CVA.  Labs largely unremarkable.  UA shows possibility of UTI with moderate leukocyte esterase and WBC count 11-20.  Was started on empiric antibiotics urine cultures obtained.  X-ray of the right hip shows some soft tissues swelling but no fracture.  The patient was admitted to a telemetry bed. Neurology was consulted. Neurology has been consulted. Work up thus far demonstrates multiple embolic infarcted areas in the left frontal and  the right cerebellar area. CTA head and neck demonstrated no intracranial arterial occlusion or high-grade stenosis. 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 Echocardiogram is pending. The patient has been evaluated by PT/OT. Their recommendation is for CIR.  Urinalysis is suggestive of UTI. The patient has been started on Rocephin.  Consultants  . Neurology  Procedures  . none  Antibiotics   Anti-infectives (From admission, onward)   Start     Dose/Rate Route Frequency Ordered Stop   07/31/19 0600  cefTRIAXone (ROCEPHIN) 1 g in sodium chloride 0.9 % 100 mL IVPB     1 g 200 mL/hr over 30 Minutes Intravenous Every 24 hours 07/31/19 0557      .  Subjective  The patient is resting comfortably. She is non-verbal for me.  Objective   Vitals:  Vitals:   07/31/19 0811 07/31/19 1210  BP: 130/72 125/66  Pulse: 74 73  Resp: 16 15  Temp: 98.2 F (36.8 C) 98.1 F (36.7 C)  SpO2: 95% 94%   Exam:  Constitutional:  . The patient is awake, alert, but not conversant with me today. No acute distress. Eyes:  . pupils and irises appear normal . Normal lids and conjunctivae Respiratory:  . No increased work of breathing. . No wheezes, rales, or rhonchi . No tactile fremitus Cardiovascular:  . Regular rate and rhythm . No murmurs, ectopy, or gallups. . No lateral PMI. No thrills. Abdomen:  . Abdomen is soft, non-tender, non-distended . No hernias, masses,  or organomegaly . Normoactive bowel sounds.  Musculoskeletal:  . No cyanosis, clubbing, or edema Skin:  . No rashes, lesions, ulcers . palpation of skin: no induration or nodules Neurologic:  . CN 2-12 intact . Sensation all 4 extremities intact . Pt appears confused. Not verbal which I believe is a change from admission. Psychiatric:  . Unable to evaluate due to the patient's inability to participate in exam.  I have personally reviewed the following:   Today's Data    . Vitals, lipid panel, HbA1c, UA .  Micro Data  . Urine culture  Scheduled Meds: . aspirin EC  325 mg Oral Daily  . atorvastatin  40 mg Oral q1800  . clopidogrel  75 mg Oral Daily  . enoxaparin (LOVENOX) injection  40 mg Subcutaneous Q24H  . mirtazapine  15 mg Oral QHS  . nicotine  7 mg Transdermal Daily   Continuous Infusions: . sodium chloride 50 mL/hr at 07/31/19 0052  . cefTRIAXone (ROCEPHIN)  IV 1 g (07/31/19 1610)    Principal Problem:   Acute CVA (cerebrovascular accident) ([place]) Active Problems:   HTN (hypertension)   LOS: 1 day   Acute Left Frontal and left cerebellar infarct: Echocardiogram pending. CTA head and neck demonstrated no intracranial arterial occlusion or high-grade stenosis. 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 The patient has been evaluated by PT/OT. Their recommendation is for CIR. Consult for rehab is in. I appreciate neurology's assistance.  UTI: Urinalysis is suggestive of UTI. Urine culture has had no growth.  Hypertension: Patient is currently normotensive. Monitor  Hyperlipidemia: Increase Crestor to 20 mg. LDL is 105 with a new stroke.  Dementia: Noted. Pt has been continued on Remeron.  I have seen and examined this patient myself. I have spent 35 minutes in her evaluation and care.  DVT Prophylaxis: Lovenox CODE STATUS: Full Code Family Communication: None available Disposition: From Home. PT/OT has recommended CIR.   Yolanda Batie, DO 07/31/2019 3:36

## 2019-07-31 NOTE — Consult Note (Addendum)
Requesting Physician: Dr. Hal Hope    Chief Complaint: Dragging right leg, falls  History obtained from: Patient and Chart    HPI:                                                                                                                                       Yolanda Hopkins is a 79 y.o. female with past medical history significant for hypertension, tobacco abuse, dementia, tobacco abuse, prior stroke who lives with her daughter brought to the emergency room by her son after she had 2 falls earlier today and he noticed that she was dragging the right leg.  According to the son, patient has had gradual decline.  She has also been not getting enough sleep and has been more anxious.  She normally walks, does not have to use her walker.  However, today patient had 2 falls once while going to the bathroom and once in the hallway-she was needing her walker much more.  He also felt her right leg was dragging and decided to bring her to the emergency department.  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  An MRI brain was performed which showed multiple embolic infarcts in the left ACA territory.  She is outside the window for TPA and was admitted to medicine service for further neurological work-up.  In addition to both symptoms, patient's son states that she is also having worsening memory issues and has been complaining of increased urgency to go to the bathroom.  Patient is not not on aspirin.  Date last known well: 3.18-21 tPA Given: No, outside TPA window NIHSS: 1 Baseline MRS 0    Past Medical History:  Diagnosis Date  . Hypertension   . Tobacco abuse 08/19/2013  . Vertigo     Past Surgical History:  Procedure Laterality Date  . COLONOSCOPY N/A 05/23/2016   Procedure: COLONOSCOPY;  Surgeon: Carol Ada, MD;  Location: [facility] ENDOSCOPY;  Service: Endoscopy;  Laterality: N/A;  . ESOPHAGOGASTRODUODENOSCOPY N/A 05/23/2016   Procedure:  ESOPHAGOGASTRODUODENOSCOPY (EGD);  Surgeon: Carol Ada, MD;  Location: [facility] ENDOSCOPY;  Service: Endoscopy;  Laterality: N/A;  . GIVENS CAPSULE STUDY N/A 05/23/2016   Procedure: GIVENS CAPSULE STUDY;  Surgeon: Carol Ada, MD;  Location: [place];  Service: Endoscopy;  Laterality: N/A;    Family History  Problem Relation Age of Onset  . Hypertension Mother   . Hypertension Father   . Liver cancer Father    Social History:  reports that she has been smoking cigarettes. She has never used smokeless tobacco. She reports that she does not drink alcohol or use drugs.  Allergies:  Allergies  Allergen Reactions  . Mango Flavor Nausea Only and Other (See Comments)    Flushing and extreme lethargy (effect is long-lasting and familial)  . Poison Ivy Extract Rash  . Sulfa Antibiotics Rash  Medications:                                                                                                                        I reviewed home medications   ROS:                                                                                                                                     14 systems reviewed and negative except above    Examination:                                                                                                      General: Appears well-developed  Psych: Patient appears to have flat affect/angry, not fully cooperative on examination Eyes: No scleral injection HENT: No OP obstrucion Head: Normocephalic.  Cardiovascular: Normal rate and regular rhythm.  Respiratory: Effort normal and breath sounds normal to anterior ascultation GI: Soft.  No distension. There is no tenderness.  Skin: WDI    Neurological Examination Mental Status: Alert, oriented to place month.  Incorrectly stated her age and year.  Thought content appropriate.  Speech fluent without evidence of aphasia. Able to follow 3 step commands without difficulty. Cranial Nerves: II: Visual  fields grossly normal,  III,IV, VI: ptosis not present, extra-ocular motions intact bilaterally, pupils equal, round, reactive to light and accommodation V,VII: smile symmetric, facial light touch sensation normal bilaterally VIII: hearing normal bilaterally IX,X: uvula rises symmetrically XI: bilateral shoulder shrug XII: midline tongue extension Motor: Right : Upper extremity   5/5    Left:     Upper extremity   5/5  Lower extremity   4/5     Lower extremity   5/5 Tone and bulk:normal tone throughout; no atrophy noted Sensory: Pinprick and light touch intact throughout, bilaterally Deep Tendon Reflexes: 2+ and symmetric throughout Plantars: Right: downgoing   Left: downgoing Cerebellar: normal finger-to-nose, normal rapid alternating movements and normal heel-to-shin test Gait: Not assessed  Lab Results: Basic Metabolic Panel: Recent Labs  Lab 07/30/19 1734  NA 140  K 4.0  CL 101  CO2 26  GLUCOSE 97  BUN 24*  CREATININE 0.86  CALCIUM 10.1    CBC: Recent Labs  Lab 07/30/19 1734 07/31/19 0015  WBC 5.9 4.9  HGB 14.2 14.5  HCT 43.7 44.4  MCV 98.9 97.8  PLT 325 277    Coagulation Studies: No results for input(s): LABPROT, INR in the last 72 hours.  Imaging: CT Head Wo Contrast  Result Date: 07/30/2019 CLINICAL DATA:  Weakness EXAM: CT HEAD WITHOUT CONTRAST TECHNIQUE: Contiguous axial images were obtained from the base of the skull through the vertex without intravenous contrast. COMPARISON:  CT brain 08/19/2013 FINDINGS: Brain: No acute territorial infarction, hemorrhage, or intracranial mass. Mild to moderate atrophy. Moderate hypodensity in the white matter consistent with chronic small vessel ischemic change. Chronic lacunar infarcts within the bilateral basal ganglia. Stable ventricle size Vascular: No hyperdense vessels.  Carotid vascular calcification Skull: Normal. Negative for fracture or focal lesion. Sinuses/Orbits: Mucosal thickening in the sinuses.  Sclerosis and bony wall thickening of the left sphenoid sinus consistent with chronic disease. Other: None IMPRESSION: 1. No CT evidence for acute intracranial abnormality. 2. Atrophy and chronic small vessel ischemic change of the white matter Electronically Signed   By: Jasmine Pang M.D.   On: 07/30/2019 20:35   MR BRAIN WO CONTRAST  Result Date: 07/30/2019 CLINICAL DATA:  Dementia and difficulty walking EXAM: MRI HEAD WITHOUT CONTRAST TECHNIQUE: Multiplanar, multiecho pulse sequences of the brain and surrounding structures were obtained without intravenous contrast. The examination had to be discontinued prior to completion due to the patient's altered mental status and inability to cooperate with technologist's instructions. Eight series are provided for interpretation. COMPARISON:  Brain MRI 08/19/2013 FINDINGS: Brain: Multifocal abnormal diffusion restriction within the left frontal white matter, within the anterior cerebral artery territory. Punctate focus of hyperintensity on diffusion-weighted imaging (axial) in the right cerebellar hemisphere may be artifactual. Multifocal white matter hyperintensity, most commonly due to chronic ischemic microangiopathy. There are multiple old small vessel infarcts of the basal ganglia and cerebellum. There is generalized atrophy without lobar predilection. There are multiple chronic microhemorrhages concentrated within the left parietal lobe. Vascular: Normal flow voids. Skull and upper cervical spine: Normal marrow signal. Sinuses/Orbits: Negative. Other: None. IMPRESSION: 1. Multiple small acute infarcts within the left frontal white matter, in the left anterior cerebral artery territory. 2. Punctate focus of hyperintensity on diffusion-weighted imaging in the right cerebellar hemisphere may be artifactual. 3. Generalized atrophy, without lobar predilection. 4. 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 5. Multiple chronic  microhemorrhages concentrated within the left parietal lobe. Electronically Signed   By: Deatra Robinson M.D.   On: 07/30/2019 23:41   DG Hip Unilat W or Wo Pelvis 2-3 Views Right  Result Date: 07/30/2019 CLINICAL DATA:  Fall, leg injury EXAM: DG HIP (WITH OR WITHOUT PELVIS) 2-3V RIGHT COMPARISON:  None. FINDINGS: The osseous structures appear diffusely demineralized which may limit detection of small or nondisplaced fractures. Bones of the pelvis appear intact and congruent. A right hip hemiarthroplasty appears normally aligned without evidence of periprosthetic fracture, hardware failure or lucency. Mild right hip swelling. Left femoral head is normally located. No proximal left femoral fracture is seen. Vascular calcium noted in the pelvis. Multilevel degenerative changes are present in the imaged portions of the spine. Additional degenerative changes in the SI joints and symphysis pubis IMPRESSION: Mild  right hip soft tissue swelling. No acute osseous abnormality or evidence acute prosthetic complication of the patient's right hip hemiarthroplasty. Electronically Signed   By: Kreg Shropshire M.D.   On: 07/30/2019 20:51     ASSESSMENT AND PLAN  79 y.o. female with past medical history significant for hypertension, tobacco abuse, dementia, tobacco abuse, prior stroke with right leg weakness and gait imbalance likely due to left ACA embolic infarcts.  Previous MRA showed hypoplastic left ACA-we will need to obtain CT angiogram to evaluate for intracranial atherosclerotic disease.  Other possibility for acute stroke may be cardioembolic.  Patient also has significant atrophy.  Her behavioral symptoms may be likely due to dementia with behavioral disturbance.  Acute left ACA stroke Punctate DWI lesion in the right cerebellum likely artifact versus small infarct\  Recommend #CTA of the head and neck #Transthoracic Echo  # Start patient on aspirin 81 mg and Plavix 75 mg daily #Start or continue  Atorvastatin 40 mg/other high intensity statin # BP goal: permissive HTN upto 85/110 mmhg # HBAIC and Lipid profile # Telemetry monitoring # Frequent neuro checks # Stroke swallow screen  Please page stroke NP  Or  PA  Or MD from 8am -4 pm  as this patient from this time will be  followed by the stroke.   You can look them up on [email or web address]  Password [facility]    Sherryl Valido [facility] Pager Number [phone]

## 2019-07-31 NOTE — Evaluation (Signed)
Occupational Therapy Evaluation Patient Details Name: Yolanda Hopkins MRN: [id number] DOB: [birth year] Today's Date: 07/31/2019    History of Present Illness Pt is an 79 y.o. female admitted 07/30/19 after son noted pt dragging RLE was confused and had two falls. 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 Possible UTI. R hip imaging without fx. PMH includes recent R hip fx (04/2019), HTN, vertigo, depression.   Clinical Impression   Pt PTA: Pt living in home, but no family answered call and no family in room to discuss home set-up. Pt currently answering few questions, but often pt giving a blank stare.  Pt with decreased cognition for simple commands, decreased strength on R side and decreased ability to care for self. Pt maxA overall for ADL other than feeding. Pt easily distracted in environment and unable to perform visual testing or get enough information for cognition.  Pt modA to maxA +2 for mobility due to weakness and distractibility. Pt would greatly benefit from continued OT skilled services in CIR setting and acutely. OT following.    Follow Up Recommendations  CIR    Equipment Recommendations  Other (comment)(TBD)    Recommendations for Other Services Rehab consult     Precautions / Restrictions Precautions Precautions: Fall;Other (comment) Precaution Comments: cognitive deficits Restrictions Weight Bearing Restrictions: No      Mobility Bed Mobility Overal bed mobility: Needs Assistance Bed Mobility: Supine to Sit     Supine to sit: Mod assist     General bed mobility comments: Pt initiating movement, but unable to sequence through task  Transfers Overall transfer level: Needs assistance Equipment used: 1 person hand held assist Transfers: Sit to/from Stand Sit to Stand: Mod assist;Max assist;+2 safety/equipment         General transfer comment: Pt initiating standing, posterior  lean and odd posturing. pt with significant R sided weakness and small steps taken from bed to window    Balance Overall balance assessment: Needs assistance   Sitting balance-Leahy Scale: Fair       Standing balance-Leahy Scale: Poor                             ADL either performed or assessed with clinical judgement   ADL Overall ADL's : Needs assistance/impaired Eating/Feeding: Set up;Sitting Eating/Feeding Details (indicate cue type and reason): Increased time after set-upA Grooming: Minimal assistance;Standing   Upper Body Bathing: Minimal assistance;Standing   Lower Body Bathing: Maximal assistance;Sitting/lateral leans;Sit to/from stand;Cueing for safety;Cueing for sequencing   Upper Body Dressing : Minimal assistance;Sitting;Cueing for safety;Cueing for sequencing   Lower Body Dressing: Maximal assistance;Sitting/lateral leans;Sit to/from stand Lower Body Dressing Details (indicate cue type and reason): at EOB, attempting to perform leaning over then figure 4 technique, but pt unable to support L side of trunk without falling to L.  Toilet Transfer: Moderate assistance;Cueing for safety;Cueing for sequencing   Toileting- Clothing Manipulation and Hygiene: Moderate assistance;+2 for physical assistance;+2 for safety/equipment;Cueing for safety;Cueing for sequencing;Sitting/lateral lean;Sit to/from stand       Functional mobility during ADLs: Maximal assistance;Cueing for safety;Cueing for sequencing General ADL Comments: Pt with decreased cognition for simple commands, decreased strength on R side and decreased ability to care for self.     Vision Baseline Vision/History: No visual deficits Vision Assessment?: Vision impaired- to be further tested in functional context Additional Comments: Pt unable to follow command to scan  Perception     Praxis      Pertinent Vitals/Pain Pain Assessment: Faces Faces Pain Scale: No hurt Pain Intervention(s):  Monitored during session     Hand Dominance Right   Extremity/Trunk Assessment Upper Extremity Assessment Upper Extremity Assessment: Generalized weakness;RUE deficits/detail;LUE deficits/detail RUE Deficits / Details: R sided weakness 3-/5 grossly  LUE Deficits / Details: 3+/5 MM grade   Lower Extremity Assessment Lower Extremity Assessment: Generalized weakness;Defer to PT evaluation RLE Deficits / Details: Functionally <3/5 throughout; pt not following commands for formal strength testing, would wiggle toes LLE Deficits / Details: Functional </= 3/5; pt not following commands for formal strength testing   Cervical / Trunk Assessment Cervical / Trunk Assessment: Kyphotic   Communication Communication Communication: Expressive difficulties;Receptive difficulties(answering few questions, but mostly gave a blank stare)   Cognition Arousal/Alertness: Awake/alert Behavior During Therapy: Flat affect Overall Cognitive Status: Impaired/Different from baseline Area of Impairment: Orientation;Attention;Following commands;Awareness;Problem solving                 Orientation Level: Disoriented to;Place;Time;Situation(Pt stating name, "march", "2021") Current Attention Level: Focused;Sustained   Following Commands: Follows one step commands inconsistently;Follows one step commands with increased time   Awareness: Intellectual Problem Solving: Slow processing;Decreased initiation;Difficulty sequencing General Comments: Answering some questions yes/no seemingly appropriate, but mostly not talking. Easily distracted. Followed <50% simple commands. Difficult to determine expressive/receptive difficulties versus baseline cognitive impairment   General Comments  PT attempted to call son.    Exercises     Shoulder Instructions      Home Living Family/patient expects to be discharged to:: Private residence                                 Additional Comments: Pt  unable to provide info; attempted to call son Cristal Deer) but no answer      Prior Functioning/Environment Level of Independence: Needs assistance        Comments: Per chart, pt was walking with walker, 2x falls leading to admission. Pt poor historian; attempted to call son who did not answer        OT Problem List: Decreased strength;Decreased activity tolerance;Impaired balance (sitting and/or standing);Decreased coordination;Impaired vision/perception;Decreased cognition;Decreased safety awareness;Pain;Impaired UE functional use      OT Treatment/Interventions: Self-care/ADL training;Therapeutic exercise;Neuromuscular education;Energy conservation;DME and/or AE instruction;Therapeutic activities;Cognitive remediation/compensation;Visual/perceptual remediation/compensation;Patient/family education;Balance training    OT Goals(Current goals can be found in the care plan section) Acute Rehab OT Goals Patient Stated Goal: None stated OT Goal Formulation: Patient unable to participate in goal setting Time For Goal Achievement: 08/14/19 Potential to Achieve Goals: Good ADL Goals Pt Will Perform Lower Body Dressing: with supervision;sitting/lateral leans;sit to/from stand Pt Will Transfer to Toilet: with min guard assist;ambulating Pt Will Perform Toileting - Clothing Manipulation and hygiene: with min guard assist;sitting/lateral leans;sit to/from stand Pt/caregiver will Perform Home Exercise Program: Increased strength;Both right and left upper extremity;With Supervision  OT Frequency: Min 2X/week   Barriers to D/C:            Co-evaluation PT/OT/SLP Co-Evaluation/Treatment: Yes Reason for Co-Treatment: Complexity of the patient's impairments (multi-system involvement);To address functional/ADL transfers PT goals addressed during session: Mobility/safety with mobility;Balance OT goals addressed during session: ADL's and self-care      AM-PAC OT "6 Clicks" Daily Activity      Outcome Measure Help from another person eating meals?: A Little Help from another person taking care of personal grooming?: A Little Help from  another person toileting, which includes using toliet, bedpan, or urinal?: A Lot Help from another person bathing (including washing, rinsing, drying)?: A Lot Help from another person to put on and taking off regular upper body clothing?: A Lot Help from another person to put on and taking off regular lower body clothing?: A Lot 6 Click Score: 14   End of Session Equipment Utilized During Treatment: Gait belt Nurse Communication: Mobility status  Activity Tolerance: Patient tolerated treatment well Patient left: in chair;with call bell/phone within reach;with chair alarm set  OT Visit Diagnosis: Unsteadiness on feet (R26.81);Muscle weakness (generalized) (M62.81);Other symptoms and signs involving cognitive function                Time: 1229-1252 OT Time Calculation (min): 23 min Charges:  OT General Charges $OT Visit: 1 Visit OT Evaluation $OT Eval Moderate Complexity: 1 Mod  Jefferey Pica, OTR/L Acute Rehabilitation Services Pager: [phone] Office: [phone]   Quirino Kakos C 07/31/2019, 4:52 PM

## 2019-07-31 NOTE — Progress Notes (Signed)
Rehab Admissions Coordinator Note:  Per PT recommendation, this patient was screened by Cheri Rous for appropriateness for an Inpatient Acute Rehab Consult.  At this time, we are recommending an Inpatient Rehab consult. AC will place consult order in the chart per protocol.   Cheri Rous 07/31/2019, 2:25 PM  I can be reached at [phone].

## 2019-07-31 NOTE — H&P (Addendum)
History and Physical    Yolanda Hopkins:[id number] DOB: [birth year] DOA: 07/30/2019  PCP: [facility]  Patient coming from: Home.  History obtained from patient's son.  Chief Complaint: Patient has difficulty walking and dragging her right lower extremity.  HPI: Yolanda Hopkins is a 79 y.o. female with history of previous stroke presently only taking Remeron for depression was brought to the ER after patient's son noted that patient has been having difficulty walking with patient dragging her right lower extremity since yesterday morning.  Over the last 2 days patient has been having increasing frequency of urination and appeared to be mildly confused.  Yesterday morning around 2 AM patient went to the bathroom to urinate and had a fall but did not lose consciousness and again had another fall 2 hours later.  From that point patient was found to have increasing difficulty walking dragging her right lower extremity and was using a walker.  Patient has been with social service from December to 2022/06/09 and was back to the family in 2022-06-09.  In 06/09/22 patient's husband had died.  Since then patient has been some depression and was restarted on Remeron.  Patient also had a fracture of the right hip in December 2020 which was managed surgically.  ED Course: In the ER on exam patient is able to move all extremities with mild weakness of the right lower extremity.  Able to walk oriented to name and place.  MRI of the brain shows multiple embolic areas in the left frontal and the right cerebellar area.  EKG shows normal sinus rhythm.  On-call neurologist has been consulted admit for further management of acute CVA.  Labs largely unremarkable.  UA shows possibility of UTI with moderate leukocyte esterase and WBC count 11-20.  Was started on empiric antibiotics urine cultures obtained.  X-ray of the right hip shows some soft tissues swelling but no fracture.  Review of Systems: As per HPI,  rest all negative.   Past Medical History:  Diagnosis Date   Hypertension    Tobacco abuse 08/19/2013   Vertigo     Past Surgical History:  Procedure Laterality Date   COLONOSCOPY N/A 05/23/2016   Procedure: COLONOSCOPY;  Surgeon: Jeani Hawking, MD;  Location: [facility] ENDOSCOPY;  Service: Endoscopy;  Laterality: N/A;   ESOPHAGOGASTRODUODENOSCOPY N/A 05/23/2016   Procedure: ESOPHAGOGASTRODUODENOSCOPY (EGD);  Surgeon: Jeani Hawking, MD;  Location: [facility] ENDOSCOPY;  Service: Endoscopy;  Laterality: N/A;   GIVENS CAPSULE STUDY N/A 05/23/2016   Procedure: GIVENS CAPSULE STUDY;  Surgeon: Jeani Hawking, MD;  Location: [facility] ENDOSCOPY;  Service: Endoscopy;  Laterality: N/A;     reports that she has been smoking cigarettes. She has never used smokeless tobacco. She reports that she does not drink alcohol or use drugs.  Allergies  Allergen Reactions   Mango Flavor Nausea Only and Other (See Comments)    Flushing and extreme lethargy (effect is long-lasting and familial)   Poison Ivy Extract Rash   Sulfa Antibiotics Rash    Family History  Problem Relation Age of Onset   Hypertension Mother    Hypertension Father    Liver cancer Father     Prior to Admission medications   Medication Sig Start Date End Date Taking? Authorizing Provider  Ibuprofen-diphenhydrAMINE Cit (ADVIL PM) 200-38 MG TABS Take 1 tablet by mouth at bedtime as needed (for sleep).   Yes [provider]  mirtazapine (REMERON) 15 MG tablet Take 15 mg by mouth at bedtime. 06/11/19  Yes [provider]  rosuvastatin (CRESTOR) 10 MG tablet Take 1 tablet (10 mg total) by mouth daily. Patient not taking: Reported on 07/31/2019 08/20/13   Cristal Ford, DO    Physical Exam: Constitutional: Moderately built and nourished. Vitals:   07/30/19 1945 07/30/19 2000 07/30/19 2130 07/30/19 2230  BP: 136/76 133/72 132/64 139/67  Pulse: 79 79 88 80  Resp: 18 20 15  (!) 21  Temp:      TempSrc:      SpO2: 96% 97% 96%  98%   Eyes: Anicteric no pallor. ENMT: No discharge from the ears eyes nose or mouth. Neck: No masses.  No neck rigidity. Respiratory: No rhonchi or crepitations. Cardiovascular: S1-S2 heard. Abdomen: Soft nontender bowel sounds present. Musculoskeletal: No edema. Skin: No rash. Neurologic: Alert awake oriented to her name and place moves all extremities 5 x 5 with mild weakness of the right lower extremity.  No facial asymmetry tongue is midline pupils equal reacting to light. Psychiatric: Appears normal.   Labs on Admission: I have personally reviewed following labs and imaging studies  CBC: Recent Labs  Lab 07/30/19 1734  WBC 5.9  HGB 14.2  HCT 43.7  MCV 98.9  PLT 401   Basic Metabolic Panel: Recent Labs  Lab 07/30/19 1734  NA 140  K 4.0  CL 101  CO2 26  GLUCOSE 97  BUN 24*  CREATININE 0.86  CALCIUM 10.1   GFR: CrCl cannot be calculated (Unknown ideal weight.). Liver Function Tests: No results for input(s): AST, ALT, ALKPHOS, BILITOT, PROT, ALBUMIN in the last 168 hours. No results for input(s): LIPASE, AMYLASE in the last 168 hours. No results for input(s): AMMONIA in the last 168 hours. Coagulation Profile: No results for input(s): INR, PROTIME in the last 168 hours. Cardiac Enzymes: No results for input(s): CKTOTAL, CKMB, CKMBINDEX, TROPONINI in the last 168 hours. BNP (last 3 results) No results for input(s): PROBNP in the last 8760 hours. HbA1C: No results for input(s): HGBA1C in the last 72 hours. CBG: No results for input(s): GLUCAP in the last 168 hours. Lipid Profile: No results for input(s): CHOL, HDL, LDLCALC, TRIG, CHOLHDL, LDLDIRECT in the last 72 hours. Thyroid Function Tests: No results for input(s): TSH, T4TOTAL, FREET4, T3FREE, THYROIDAB in the last 72 hours. Anemia Panel: No results for input(s): VITAMINB12, FOLATE, FERRITIN, TIBC, IRON, RETICCTPCT in the last 72 hours. Urine analysis:    Component Value Date/Time   COLORURINE STRAW  (A) 07/30/2019 2204   APPEARANCEUR CLEAR 07/30/2019 2204   LABSPEC 1.008 07/30/2019 2204   PHURINE 6.0 07/30/2019 2204   GLUCOSEU NEGATIVE 07/30/2019 2204   HGBUR NEGATIVE 07/30/2019 2204   BILIRUBINUR NEGATIVE 07/30/2019 2204   KETONESUR NEGATIVE 07/30/2019 2204   PROTEINUR NEGATIVE 07/30/2019 2204   UROBILINOGEN 0.2 08/19/2013 1055   NITRITE NEGATIVE 07/30/2019 2204   LEUKOCYTESUR MODERATE (A) 07/30/2019 2204   Sepsis Labs: @LABRCNTIP (procalcitonin:4,lacticidven:4) )No results found for this or any previous visit (from the past 240 hour(s)).   Radiological Exams on Admission: CT Head Wo Contrast  Result Date: 07/30/2019 CLINICAL DATA:  Weakness EXAM: CT HEAD WITHOUT CONTRAST TECHNIQUE: Contiguous axial images were obtained from the base of the skull through the vertex without intravenous contrast. COMPARISON:  CT brain 08/19/2013 FINDINGS: Brain: No acute territorial infarction, hemorrhage, or intracranial mass. Mild to moderate atrophy. Moderate hypodensity in the white matter consistent with chronic small vessel ischemic change. Chronic lacunar infarcts within the bilateral basal ganglia. Stable ventricle size Vascular: No hyperdense vessels.  Carotid vascular  calcification Skull: Normal. Negative for fracture or focal lesion. Sinuses/Orbits: Mucosal thickening in the sinuses. Sclerosis and bony wall thickening of the left sphenoid sinus consistent with chronic disease. Other: None IMPRESSION: 1. No CT evidence for acute intracranial abnormality. 2. Atrophy and chronic small vessel ischemic change of the white matter Electronically Signed   By: Jasmine Pang M.D.   On: 07/30/2019 20:35   MR BRAIN WO CONTRAST  Result Date: 07/30/2019 CLINICAL DATA:  Dementia and difficulty walking EXAM: MRI HEAD WITHOUT CONTRAST TECHNIQUE: Multiplanar, multiecho pulse sequences of the brain and surrounding structures were obtained without intravenous contrast. The examination had to be discontinued prior  to completion due to the patient's altered mental status and inability to cooperate with technologist's instructions. Eight series are provided for interpretation. COMPARISON:  Brain MRI 08/19/2013 FINDINGS: Brain: Multifocal abnormal diffusion restriction within the left frontal white matter, within the anterior cerebral artery territory. Punctate focus of hyperintensity on diffusion-weighted imaging (axial) in the right cerebellar hemisphere may be artifactual. Multifocal white matter hyperintensity, most commonly due to chronic ischemic microangiopathy. There are multiple old small vessel infarcts of the basal ganglia and cerebellum. There is generalized atrophy without lobar predilection. There are multiple chronic microhemorrhages concentrated within the left parietal lobe. Vascular: Normal flow voids. Skull and upper cervical spine: Normal marrow signal. Sinuses/Orbits: Negative. Other: None. IMPRESSION: 1. Multiple small acute infarcts within the left frontal white matter, in the left anterior cerebral artery territory. 2. Punctate focus of hyperintensity on diffusion-weighted imaging in the right cerebellar hemisphere may be artifactual. 3. Generalized atrophy, without lobar predilection. 4. 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 5. Multiple chronic microhemorrhages concentrated within the left parietal lobe. Electronically Signed   By: Deatra Robinson M.D.   On: 07/30/2019 23:41   DG Hip Unilat W or Wo Pelvis 2-3 Views Right  Result Date: 07/30/2019 CLINICAL DATA:  Fall, leg injury EXAM: DG HIP (WITH OR WITHOUT PELVIS) 2-3V RIGHT COMPARISON:  None. FINDINGS: The osseous structures appear diffusely demineralized which may limit detection of small or nondisplaced fractures. Bones of the pelvis appear intact and congruent. A right hip hemiarthroplasty appears normally aligned without evidence of periprosthetic fracture, hardware failure or lucency. Mild right hip swelling.  Left femoral head is normally located. No proximal left femoral fracture is seen. Vascular calcium noted in the pelvis. Multilevel degenerative changes are present in the imaged portions of the spine. Additional degenerative changes in the SI joints and symphysis pubis IMPRESSION: Mild right hip soft tissue swelling. No acute osseous abnormality or evidence acute prosthetic complication of the patient's right hip hemiarthroplasty. Electronically Signed   By: Kreg Shropshire M.D.   On: 07/30/2019 20:51    EKG: Independently reviewed.  Normal sinus rhythm.  Assessment/Plan Principal Problem:   Acute CVA (cerebrovascular accident) (HCC) Active Problems:   HTN (hypertension)    1. Acute CVA -discussed with Dr. Jerrell Belfast on-call neurologist who at this time as ordered CT angiogram of the head and neck check 2D echo patient is on aspirin and Plavix has passed swallow.  On Lipitor.  Physical therapy consult.  Neurochecks.  Check hemoglobin A1c lipid panel. 2. History of depression on Remeron. 3. X-ray showed right hip soft tissue swelling.  Will closely monitor has had recent right hip fracture which was managed surgically. 4. Possible UTI that given the patient's symptoms of frequent urination with urine showing leukocyte esterase and WBC count will treat with ceftriaxone follow urine cultures.  Given that  patient has acute CVA will need close monitoring for any deterioration in inpatient status.   DVT prophylaxis: Lovenox. Code Status: Full code confirmed with patient's son. Family Communication: Patient's son. Disposition Plan: Home. Consults called: Neurology. Admission status: Inpatient.   Eduard Clos MD [facility] Pager [phone].  If 7PM-7AM, please contact night-coverage [email or web address] Password [email or web address]  07/31/2019, 12:20 AM

## 2019-07-31 NOTE — Plan of Care (Signed)

## 2019-08-01 ENCOUNTER — Inpatient Hospital Stay (HOSPITAL_COMMUNITY): Payer: Medicare Other

## 2019-08-01 DIAGNOSIS — I6389 Other cerebral infarction: Secondary | ICD-10-CM

## 2019-08-01 DIAGNOSIS — F17200 Nicotine dependence, unspecified, uncomplicated: Secondary | ICD-10-CM

## 2019-08-01 DIAGNOSIS — I63522 Cerebral infarction due to unspecified occlusion or stenosis of left anterior cerebral artery: Principal | ICD-10-CM

## 2019-08-01 DIAGNOSIS — E782 Mixed hyperlipidemia: Secondary | ICD-10-CM

## 2019-08-01 LAB — URINE CULTURE: Culture: <10000 — AB

## 2019-08-01 LAB — ECHOCARDIOGRAM COMPLETE

## 2019-08-01 MED ORDER — BISACODYL 5 MG PO TBEC
5.0000 mg | DELAYED_RELEASE_TABLET | Freq: Every day | ORAL | Status: DC | PRN
  Administered 2019-08-01 – 2019-08-05 (×3): 5 mg via ORAL
  Filled 2019-08-01 (×3): qty 1

## 2019-08-01 NOTE — Progress Notes (Signed)
SLP Cancellation Note  Patient Details Name: Yolanda Hopkins MRN: [id number] DOB: 20-Apr-1941   Cancelled treatment:       Reason Eval/Treat Not Completed: Patient declined, no reason specified(Pt was approached for evaluation but she frequently yawning, closing her eyes and not responding to the SLP's questions. Upon inquiry, the pt indicated that she was fatigued and requested that the evaluation be deferred at this time. SLP will follow up on subsequent date.)  Odas Ozer I. Vear Clock, MS, CCC-SLP Acute Rehabilitation Services Office number [phone] Pager [phone]  Scheryl Marten 08/01/2019, 3:27 PM

## 2019-08-01 NOTE — Progress Notes (Signed)
Echocardiogram 2D Echocardiogram has been performed.  Yolanda Hopkins 08/01/2019, 9:47 AM

## 2019-08-01 NOTE — Progress Notes (Signed)
STROKE TEAM PROGRESS NOTE   INTERVAL HISTORY No family is at the bedside. Pt lying in bed, orientated to self but not to age, time, place. Flat affect, abulia, but able to follow commands. Mild right hemiparesis.   OBJECTIVE Vitals:   07/31/19 1210 07/31/19 1731 07/31/19 2338 08/01/19 0402  BP: 125/66 (!) 124/97 137/80 (!) 141/72  Pulse: 73 87 80 80  Resp: 15 15 18 18   Temp: 98.1 F (36.7 C) 98.3 F (36.8 C) 98 F (36.7 C) (!) 97.3 F (36.3 C)  TempSrc: Oral Oral Oral Oral  SpO2: 94% 99% 94% 94%    CBC:  Recent Labs  Lab 07/30/19 1734 07/31/19 0015  WBC 5.9 4.9  HGB 14.2 14.5  HCT 43.7 44.4  MCV 98.9 97.8  PLT 325 332    Basic Metabolic Panel:  Recent Labs  Lab 07/30/19 1734 07/31/19 0015  NA 140  --   K 4.0  --   CL 101  --   CO2 26  --   GLUCOSE 97  --   BUN 24*  --   CREATININE 0.86 0.61  CALCIUM 10.1  --     Lipid Panel:     Component Value Date/Time   CHOL 233 (H) 07/31/2019 0015   TRIG 53 07/31/2019 0015   HDL 64 07/31/2019 0015   CHOLHDL 3.6 07/31/2019 0015   VLDL 11 07/31/2019 0015   LDLCALC 158 (H) 07/31/2019 0015   HgbA1c:  Lab Results  Component Value Date   HGBA1C 5.3 07/31/2019   Urine Drug Screen: No results found for: LABOPIA, COCAINSCRNUR, LABBENZ, AMPHETMU, THCU, LABBARB  Alcohol Level No results found for: ETH  IMAGING  CT ANGIO HEAD W OR WO CONTRAST CT ANGIO NECK W OR WO CONTRAST 07/31/2019 IMPRESSION:  1. No intracranial arterial occlusion or high-grade stenosis.  2. 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   CT Head Wo Contrast 07/30/2019 IMPRESSION:  1. No CT evidence for acute intracranial abnormality.  2. Atrophy and chronic small vessel ischemic change of the white matter   MR BRAIN WO CONTRAST 07/30/2019 IMPRESSION:  1. Multiple small acute infarcts within the left frontal white matter, in the left anterior cerebral artery territory.  2. Punctate focus of hyperintensity on  diffusion-weighted imaging in the right cerebellar hemisphere may be artifactual.  3. Generalized atrophy, without lobar predilection.  4. 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  5. Multiple chronic microhemorrhages concentrated within the left parietal lobe.   DG Hip Unilat W or Wo Pelvis 2-3 Views Right 07/30/2019 IMPRESSION:  Mild right hip soft tissue swelling. No acute osseous abnormality or evidence acute prosthetic complication of the patient's right hip hemiarthroplasty.    Transthoracic Echocardiogram  08/01/19 IMPRESSIONS: 1. Left ventricular ejection fraction, by estimation, is 55 to 60%. The  left ventricle has normal function. The left ventricle has no regional  wall motion abnormalities. Left ventricular diastolic parameters are  consistent with Grade I diastolic  dysfunction (impaired relaxation).  2. Right ventricular systolic function is normal. The right ventricular  size is normal. There is normal pulmonary artery systolic pressure. The  estimated right ventricular systolic pressure is 95.1 mmHg.  3. The mitral valve is grossly normal. Trivial mitral valve  regurgitation.  4. The aortic valve is tricuspid. Aortic valve regurgitation is not  visualized. Mild aortic valve sclerosis is present, with no evidence of  aortic valve stenosis.   ECG - SR rate 89  BPM. (See cardiology reading for complete details)   PHYSICAL EXAM  Temp:  [97.3 F (36.3 C)-98.3 F (36.8 C)] 98 F (36.7 C) (03/21 0814) Pulse Rate:  [73-87] 81 (03/21 0814) Resp:  [15-18] 18 (03/21 0814) BP: (124-153)/(66-97) 153/82 (03/21 0814) SpO2:  [94 %-99 %] 95 % (03/21 0814)  General - Well nourished, well developed, in no apparent distress.  Ophthalmologic - fundi not visualized due to noncooperation.  Cardiovascular - Regular rhythm and rate.  Mental Status -  Level of arousal and orientation to self were intact. However, not orientated to place, time or  age. Limited language output, but able to follow simple commands, but abulia with flat affect and psychomotor slowing. Able to name 3/4 and able to repeat  Cranial Nerves II - XII - II - Visual field intact OU. III, IV, VI - Extraocular movements intact. V - Facial sensation intact bilaterally. VII - Facial movement intact bilaterally. VIII - Hearing & vestibular intact bilaterally. X - Palate elevates symmetrically. XI - Chin turning & shoulder shrug intact bilaterally. XII - Tongue protrusion intact.  Motor Strength - The patient's strength was 4/5 LUE and LLE, however, 3/5 RUE proximal and 4/5 distally, 3/5 RLE proximal and 4/5 distal and pronator drift was present.  Bulk was normal and fasciculations were absent.   Motor Tone - Muscle tone was assessed at the neck and appendages and was normal.  Reflexes - The patient's reflexes were symmetrical in all extremities and she had no pathological reflexes.  Sensory - Light touch, temperature/pinprick were assessed and were symmetrical.    Coordination - The patient had normal movements in the hands with no ataxia or dysmetria.  Tremor was absent.  Gait and Station - deferred.   ASSESSMENT/PLAN Ms. Yolanda Hopkins is a 79 y.o. female with history of hypertension, dementia, previous stroke, vertigo and tobacco use, presenting with right sided weakness and 2 falls. She did not receive IV t-PA due to late presentation (>4.5 hours from time of onset)  Strokes:  Left ACA scattered infarcts, likely due to large vessel atherosclerosis.  Resultant abulia, flat affect, right hemiparesis  CT head - No CT evidence for acute intracranial abnormality.   MRI head - Multiple small acute infarcts within the left frontal white matter, in the left anterior cerebral artery territory. 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  2D Echo - EF 55 to 60%. No cardiac source of emboli identified.   [employer] Virus 2 - not ordered  LDL - 158  HgbA1c - 5.3  VTE prophylaxis - Lovenox  No antithrombotic prior to admission, now on aspirin 325 mg daily and clopidogrel 75 mg daily DAPT for 3 months and then aspirin alone due to severe left ACA stenosis.  Patient counseled to be compliant with her antithrombotic medications  Ongoing aggressive stroke risk factor management  Therapy recommendations:  CIR  Disposition:  Pending  History of stroke  08/2013 admitted for vertigo, MRI found left BG infarct.  LDL 199, put on aspirin 325 and Crestor 10.  Dementia  As per chart, patient had gradual decline, worsening memory  Exam showed flat affect, disorientation  Follow-up with neurology as outpatient  Hypertension  Home BP meds: none  Current BP meds: none   Stable .  Gradually normalize in 2-3 days  . Long-term BP goal normotensive  Hyperlipidemia  Home Lipid lowering medication: Crestor 10 mg daily  LDL 158, goal < 70  Current lipid lowering medication: Lipitor 40 mg daily  Continue statin at discharge  Tobacco abuse  Current smoker  Smoking cessation counseling provided  Pt is willing to quit  Other Stroke Risk Factors  Advanced age  Other Active Problems  Code status - Full Code  UTI - UA WBC 11-20, on Rocephin  Hospital day # 2  Neurology will sign off. Please call with questions. Pt will follow up with stroke clinic NP at [facility] in about 4 weeks. Thanks for the consult.  Marvel Plan, MD PhD Stroke Neurology 08/01/2019 3:58 PM   To contact Stroke Continuity provider, please refer to [email or web address]. After hours, contact General Neurology

## 2019-08-01 NOTE — Progress Notes (Signed)
Physical Therapy Treatment Patient Details Name: Yolanda Hopkins MRN: [id number] DOB: 10-19-1940 Today's Date: 08/01/2019    History of Present Illness Pt is an 79 y.o. female admitted 07/30/19 after son noted pt dragging RLE was confused and had two falls. 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 Possible UTI. R hip imaging without fx. PMH includes recent R hip fx (04/2019), HTN, vertigo, depression.    PT Comments    Yolanda Hopkins received in bed with HOB up and eating lunch. Nurse was present on arrival. PT requested to get her into chair to mobilize prior to finishing lunch. Yolanda Hopkins was receptive of that request and proceeded to initiate movement to get out of bed. Pt required mod assist to get EOB. Pt verbalized very little but was able to confirm name, DOB, and son's name Cristal Deer). Pt required multimodal cueing and heavy mod assist to stand; cues to initiate with anterior weight shift due to continuing posterior bias. Notably increased ambulation distance, with encouragement, and facilitation of lateral weight shifting into stance (R more significantly than L) to allow for advancement of swing extremity. Ended session in recliner, preparing to eat lunch. Yolanda Hopkins nodded when asked if she liked walking today. Continue to recommend CIR for post-acute therapy needs.    Follow Up Recommendations  CIR;Supervision/Assistance - 24 hour     Equipment Recommendations       Recommendations for Other Services       Precautions / Restrictions Precautions Precautions: Fall Precaution Comments: cognitive deficits Restrictions Weight Bearing Restrictions: No    Mobility  Bed Mobility Overal bed mobility: Needs Assistance Bed Mobility: Supine to Sit     Supine to sit: Mod assist     General bed mobility comments: Pt initiating movement, but required assistance getting LEs off of bed from sidelying and  lifting trunk into seated position; pt required verbal cues to move closer to EOB to get feet to floor to complete the task  Transfers Overall transfer level: Needs assistance Equipment used: 2 person hand held assist Transfers: Sit to/from Stand Sit to Stand: Mod assist;+2 safety/equipment         General transfer comment: Pt initiated standing, holding tight onto both forearms for support; pt had posterior bias and required cues for anterior weight shift; pt braced backs of LEs against bed   Ambulation/Gait Ambulation/Gait assistance: Mod assist;+2 safety/equipment Gait Distance (Feet): 40 Feet Assistive device: 2 person hand held assist Gait Pattern/deviations: Decreased step length - right;Step-to pattern;Decreased stride length;Decreased weight shift to left;Shuffle;Trunk flexed;Wide base of support Gait velocity: Decreased   General Gait Details: Slow, unsteady steps with HHA and modA to maintain balance; shuffling gait with RLE lagging behind, difficulty taking complete step to progress RLE forwards or backwards despite max, multimodal cues   Stairs             Wheelchair Mobility    Modified Rankin (Stroke Patients Only)       Balance Overall balance assessment: Needs assistance;History of Falls Sitting-balance support: Bilateral upper extremity supported Sitting balance-Leahy Scale: Poor     Standing balance support: Bilateral upper extremity supported Standing balance-Leahy Scale: Poor                              Cognition Arousal/Alertness: Awake/alert Behavior During Therapy: Flat affect Overall Cognitive Status: No family/caregiver present to determine baseline cognitive  functioning Area of Impairment: Following commands;Safety/judgement;Problem solving               Rancho Levels of Cognitive Functioning Rancho Los Amigos Scales of Cognitive Functioning: Automatic/appropriate   Current Attention Level: Focused;Sustained    Following Commands: Follows one step commands inconsistently;Follows one step commands with increased time     Problem Solving: Slow processing;Decreased initiation;Requires verbal cues;Requires tactile cues;Difficulty sequencing General Comments: Answered correctly 3/3 biographical yes/no questions (name, DOB, son's name). Did not verbalize unless prompted. Required question to be repeated >50% of the time.       Exercises      General Comments        Pertinent Vitals/Pain Pain Assessment: No/denies pain    Home Living                      Prior Function            PT Goals (current goals can now be found in the care plan section) Acute Rehab PT Goals Patient Stated Goal: None stated PT Goal Formulation: Patient unable to participate in goal setting Time For Goal Achievement: 08/14/19 Potential to Achieve Goals: Fair Progress towards PT goals: Progressing toward goals    Frequency    Min 4X/week      PT Plan Current plan remains appropriate    Co-evaluation     PT goals addressed during session: Mobility/safety with mobility;Balance;Other (comment)(Gait)        AM-PAC PT "6 Clicks" Mobility   Outcome Measure  Help needed turning from your back to your side while in a flat bed without using bedrails?: A Lot Help needed moving from lying on your back to sitting on the side of a flat bed without using bedrails?: A Lot Help needed moving to and from a bed to a chair (including a wheelchair)?: A Lot Help needed standing up from a chair using your arms (e.g., wheelchair or bedside chair)?: A Lot Help needed to walk in hospital room?: A Lot Help needed climbing 3-5 steps with a railing? : A Lot 6 Click Score: 12    End of Session Equipment Utilized During Treatment: Gait belt Activity Tolerance: Patient tolerated treatment well Patient left: in chair;with call bell/phone within reach;with chair alarm set;Other (comment)(Left with lunch) Nurse  Communication: Mobility status;Other (comment)(Chair alarm battery was low) PT Visit Diagnosis: Unsteadiness on feet (R26.81);Other abnormalities of gait and mobility (R26.89);History of falling (Z91.81);Other symptoms and signs involving the nervous system (R29.898)     Time: 6812-7517 PT Time Calculation (min) (ACUTE ONLY): 35 min  Charges:  $Gait Training: 23-37 mins                     Lorre Munroe, SPT Acute Rehab Office [phone]   Loistine Simas 08/01/2019, 2:11 PM

## 2019-08-01 NOTE — Progress Notes (Signed)
PROGRESS NOTE  Yolanda Hopkins:[id number] DOB: [birth year] DOA: 07/30/2019 PCP: [facility]  Brief History    Yolanda Hopkins is a 79 y.o. female with history of previous stroke presently only taking Remeron for depression was brought to the ER after patient's son noted that patient has been having difficulty walking with patient dragging her right lower extremity since yesterday morning.  Over the last 2 days patient has been having increasing frequency of urination and appeared to be mildly confused.  Yesterday morning around 2 AM patient went to the bathroom to urinate and had a fall but did not lose consciousness and again had another fall 2 hours later.  From that point patient was found to have increasing difficulty walking dragging her right lower extremity and was using a walker.  Patient has been with social service from December to 06/14/22 and was back to the family in 2022-06-14.  In 06/14/22 patient's husband had died.  Since then patient has been some depression and was restarted on Remeron.  Patient also had a fracture of the right hip in December 2020 which was managed surgically.  ED Course: In the ER on exam patient is able to move all extremities with mild weakness of the right lower extremity.  Able to walk oriented to name and place.  MRI of the brain shows multiple embolic areas in the left frontal and the right cerebellar area.  EKG shows normal sinus rhythm.  On-call neurologist has been consulted admit for further management of acute CVA.  Labs largely unremarkable.  UA shows possibility of UTI with moderate leukocyte esterase and WBC count 11-20.  Was started on empiric antibiotics urine cultures obtained.  X-ray of the right hip shows some soft tissues swelling but no fracture.  The patient was admitted to a telemetry bed. Neurology was consulted. Neurology has been consulted. Work up thus far demonstrates multiple embolic infarcted areas in the left frontal and  the right cerebellar area. CTA head and neck demonstrated no intracranial arterial occlusion or high-grade stenosis. 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 Echocardiogram has demonstrated an EF of 55 - 60% with normal function. There is borderline left ventricular hypertrophy. There is Grade 1 diastolic dysfunction. RV function and size is normal. The interatrial septum is lipomatous. There is no atrial level shunt detected. There is no intracardiac thrombus reported. The patient has been evaluated by PT/OT. Their recommendation is for CIR. Rehab has been consulted, and the patient is awaiting placement.  Urinalysis is suggestive of UTI. The patient has been started on Rocephin.  Consultants  . Neurology  Procedures  . none  Antibiotics   Anti-infectives (From admission, onward)   Start     Dose/Rate Route Frequency Ordered Stop   07/31/19 0600  cefTRIAXone (ROCEPHIN) 1 g in sodium chloride 0.9 % 100 mL IVPB     1 g 200 mL/hr over 30 Minutes Intravenous Every 24 hours 07/31/19 0557       Subjective  The patient is resting comfortably. She is verbal and appropriate today. No new complaints.  Objective   Vitals:  Vitals:   08/01/19 0814 08/01/19 1333  BP: (!) 153/82 (!) 143/77  Pulse: 81 86  Resp: 18 18  Temp: 98 F (36.7 C) 98.1 F (36.7 C)  SpO2: 95% 98%   Exam:  Constitutional:  . The patient is awake, alert, and conversant with me today. No acute distress. Eyes:  .  pupils and irises appear normal . Normal lids and conjunctivae Respiratory:  . No increased work of breathing. . No wheezes, rales, or rhonchi . No tactile fremitus Cardiovascular:  . Regular rate and rhythm . No murmurs, ectopy, or gallups. . No lateral PMI. No thrills. Abdomen:  . Abdomen is soft, non-tender, non-distended . No hernias, masses, or organomegaly . Normoactive bowel sounds.  Musculoskeletal:  . No cyanosis, clubbing, or  edema Skin:  . No rashes, lesions, ulcers . palpation of skin: no induration or nodules Neurologic:  . CN 2-12 intact . Sensation all 4 extremities intact . Pt appears confused. Not verbal which I believe is a change from admission. Psychiatric:  . Unable to evaluate due to the patient's inability to participate in exam.  I have personally reviewed the following:   Today's Data  . Vitals, echocardiogram .  Micro Data  . Urine culture  Scheduled Meds: . aspirin EC  325 mg Oral Daily  . atorvastatin  40 mg Oral q1800  . clopidogrel  75 mg Oral Daily  . enoxaparin (LOVENOX) injection  40 mg Subcutaneous Q24H  . mirtazapine  15 mg Oral QHS  . nicotine  7 mg Transdermal Daily   Continuous Infusions: . 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 CTA head and neck demonstrated no intracranial arterial occlusion or high-grade stenosis. 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  Echocardiogram has demonstrated an EF of 55 - 60% with normal function. There is borderline left ventricular hypertrophy. There is Grade 1 diastolic dysfunction. RV function and size is normal. The interatrial septum is lipomatous. There is no atrial level shunt detected. There is no intracardiac thrombus reported. The patient has been evaluated by PT/OT. Their recommendation is for CIR. Rehab has been consulted, and the patient is awaiting placement.  UTI: Urinalysis is suggestive of UTI. Urine culture has had no growth. The patient is receiving Rocephin.  Hypertension: Patient is currently normotensive. Monitor  Hyperlipidemia: Increase Atorvastatin to 40 mg. LDL is 105 with a new stroke.  Dementia: Noted. Pt has been continued on Remeron.  I have seen and  examined this patient myself. I have spent 32 minutes in her evaluation and care.  DVT Prophylaxis: Lovenox CODE STATUS: Full Code Family Communication: None available Disposition: From Home. PT/OT has recommended CIR.   Analyce Tavares, DO 08/01/2019 3:46

## 2019-08-02 DIAGNOSIS — I639 Cerebral infarction, unspecified: Secondary | ICD-10-CM

## 2019-08-02 MED ORDER — MIRTAZAPINE 15 MG PO TABS
7.5000 mg | ORAL_TABLET | Freq: Every day | ORAL | Status: DC
  Administered 2019-08-02 – 2019-08-04 (×3): 7.5 mg via ORAL
  Filled 2019-08-02 (×3): qty 1

## 2019-08-02 MED ORDER — SODIUM CHLORIDE 0.9% FLUSH
10.0000 mL | Freq: Two times a day (BID) | INTRAVENOUS | Status: DC
  Administered 2019-08-02 – 2019-08-05 (×7): 10 mL

## 2019-08-02 MED ORDER — SODIUM CHLORIDE 0.9% FLUSH: 10.0000 mL | INTRAVENOUS | Status: DC | PRN

## 2019-08-02 NOTE — Progress Notes (Signed)
Physical Therapy Treatment Patient Details Name: Yolanda Hopkins MRN: [id number] DOB: 1940/05/28 Today's Date: 08/02/2019    History of Present Illness Pt is an 79 y.o. female admitted 07/30/19 after son noted pt dragging RLE was confused and had two falls. 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 Possible UTI. R hip imaging without fx. PMH includes recent R hip fx (04/2019), HTN, vertigo, depression.    PT Comments    Yolanda Hopkins received in bed with daughter Elita Quick) in room; she had just ordered lunch and participated in a SLP eval. Yolanda Hopkins was reluctant to get out of bed, but willing after rearranging the room. Pt remembered PT from yesterday. She required multimodal cues and mod assist to get to EOB from supine. To note, Yolanda Hopkins utilized RW today in place of bilateral hand-held assist. She required bilateral assistance to transfer to standing and maintain balance, but once there was able to balance with bilateral UE support from walker. In gait, PT facilitated weight shift and R LE advancement. Spoke with daughter following session to get a better understanding of prior level of function; until recently, she was independent and communicative. Per speech eval, she lives with a daughter and son and has a part-time caretaker at home. PT continues to recommend CIR for post-acute therapy needs.     Follow Up Recommendations  CIR;Supervision/Assistance - 24 hour     Equipment Recommendations  Rolling walker with 5" wheels    Recommendations for Other Services       Precautions / Restrictions Precautions Precautions: Fall Restrictions Weight Bearing Restrictions: No    Mobility  Bed Mobility Overal bed mobility: Needs Assistance Bed Mobility: Supine to Sit     Supine to sit: Mod assist     General bed mobility comments: Pt initiating movement; verbal cue to reach across body to grab bar; was able to bring  LEs to side of bed but required assistance to bring LEs off of bed and lift trunk to seated position at EOB; required verbal cues for hand placement on bed for supported sitting and to move closer to EOB to reach feet to ground to complete the task  Transfers Overall transfer level: Needs assistance Equipment used: Rolling walker (2 wheeled) Transfers: Sit to/from Stand Sit to Stand: Mod assist;+2 safety/equipment         General transfer comment: Pt completed 2 sit to stand transfers; transfer from bed required 2+, 1 to stabilize walker and 1 to assist for power-up to stand. Transfer from chair, patient utilized walker to stand and was able to mostly power-up on her own and required light mod assistance to power-up and 2+ to stabilize the walker  Ambulation/Gait Ambulation/Gait assistance: Mod assist;+2 safety/equipment Gait Distance (Feet): 50 Feet Assistive device: Rolling walker (2 wheeled) Gait Pattern/deviations: Decreased step length - right;Step-to pattern;Decreased stride length;Decreased weight shift to left;Shuffle;Trunk flexed;Wide base of support Gait velocity: Decreased   General Gait Details: Slow, unsteady steps with RW and modA to maintain balance; shuffling gait with RLE lagging behind, difficulty taking complete step to progress RLE forwards or backwards despite max, multimodal cues   Stairs             Wheelchair Mobility    Modified Rankin (Stroke Patients Only) Modified Rankin (Stroke Patients Only) Pre-Morbid Rankin Score: Slight disability Modified Rankin: Moderately severe disability     Balance Overall balance assessment: Needs assistance;History of Falls Sitting-balance support:  Bilateral upper extremity supported Sitting balance-Leahy Scale: Poor     Standing balance support: Bilateral upper extremity supported Standing balance-Leahy Scale: Poor                              Cognition Arousal/Alertness: Awake/alert Behavior  During Therapy: Flat affect Overall Cognitive Status: Impaired/Different from baseline Area of Impairment: Following commands;Safety/judgement;Problem solving               Rancho Levels of Cognitive Functioning Rancho Los Amigos Scales of Cognitive Functioning: Automatic/appropriate   Current Attention Level: Alternating   Following Commands: Follows one step commands inconsistently;Follows one step commands with increased time     Problem Solving: Slow processing;Decreased initiation;Requires verbal cues;Requires tactile cues;Difficulty sequencing General Comments: Was able to respond to verbal cues and answer yes/no questions; answered closed-ended questions about 75% of the time; alternated attention between individuals in the room      Exercises      General Comments        Pertinent Vitals/Pain Pain Assessment: Faces Faces Pain Scale: No hurt    Home Living     Available Help at Discharge: Family;Personal care attendant Type of Home: House              Prior Function            PT Goals (current goals can now be found in the care plan section) Acute Rehab PT Goals Patient Stated Goal: None stated PT Goal Formulation: With family Time For Goal Achievement: 08/14/19 Potential to Achieve Goals: Fair Progress towards PT goals: Progressing toward goals    Frequency    Min 4X/week      PT Plan Current plan remains appropriate    Co-evaluation     PT goals addressed during session: Mobility/safety with mobility;Balance;Proper use of DME        AM-PAC PT "6 Clicks" Mobility   Outcome Measure  Help needed turning from your back to your side while in a flat bed without using bedrails?: A Lot Help needed moving from lying on your back to sitting on the side of a flat bed without using bedrails?: A Lot Help needed moving to and from a bed to a chair (including a wheelchair)?: A Lot Help needed standing up from a chair using your arms (e.g.,  wheelchair or bedside chair)?: A Lot Help needed to walk in hospital room?: A Lot Help needed climbing 3-5 steps with a railing? : A Lot 6 Click Score: 12    End of Session Equipment Utilized During Treatment: Gait belt Activity Tolerance: Patient tolerated treatment well Patient left: in chair;with call bell/phone within reach;with chair alarm set;Other (comment);with family/visitor present   PT Visit Diagnosis: Unsteadiness on feet (R26.81);Other abnormalities of gait and mobility (R26.89);History of falling (Z91.81);Muscle weakness (generalized) (M62.81);Other symptoms and signs involving the nervous system (Z61.096)     Time: 0454-0981 PT Time Calculation (min) (ACUTE ONLY): 31 min  Charges:                        Lorre Munroe, SPT Acute Rehab Office [phone]   Loistine Simas 08/02/2019, 2:01 PM

## 2019-08-02 NOTE — Consult Note (Signed)
Physical Medicine and Rehabilitation Consult Reason for Consult: Right side weakness and altered mental status Referring Physician: [facility]   HPI: Yolanda Hopkins is a 79 y.o. right-handed female with history of hypertension, depression, hyperlipidemia tobacco abuse and vertigo.  Presented 07/31/2019 with right-sided weakness as well as falls x2 and altered mental status.  Cranial CT scan negative for acute changes.  Patient did not receive TPA.  MRI of the brain showed multiple small acute infarcts within the left frontal white matter, and the left anterior cerebral artery territory.  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  CT angiogram of head and neck no intracranial arterial occlusion or high-grade stenosis.  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  Echocardiogram with ejection fraction of 60%.  Admission chemistries unremarkable, SARS coronavirus negative.  Neurology follow-up currently on aspirin 325 mg and Plavix for CVA prophylaxis x3 months then aspirin alone due to severe left ACA stenosis.  Subcutaneous Lovenox for DVT prophylaxis.  Therapy evaluations completed with recommendations of physical medicine rehab consult.   Review of Systems  Constitutional: Negative for chills and fever.  HENT: Negative for hearing loss.   Eyes: Negative for blurred vision and double vision.  Respiratory: Negative for shortness of breath.   Cardiovascular: Negative for chest pain, palpitations and leg swelling.  Gastrointestinal: Positive for constipation. Negative for heartburn and nausea.  Genitourinary: Negative for dysuria, flank pain and hematuria.  Musculoskeletal: Positive for falls and myalgias.  Skin: Negative for rash.  Neurological: Positive for weakness.       Vertigo  Psychiatric/Behavioral: Positive for memory loss.  All other systems reviewed and are negative.  Past Medical History:  Diagnosis Date  .  Hypertension   . Tobacco abuse 08/19/2013  . Vertigo    Past Surgical History:  Procedure Laterality Date  . COLONOSCOPY N/A 05/23/2016   Procedure: COLONOSCOPY;  Surgeon: Carol Ada, MD;  Location: [facility] ENDOSCOPY;  Service: Endoscopy;  Laterality: N/A;  . ESOPHAGOGASTRODUODENOSCOPY N/A 05/23/2016   Procedure: ESOPHAGOGASTRODUODENOSCOPY (EGD);  Surgeon: Carol Ada, MD;  Location: [facility] ENDOSCOPY;  Service: Endoscopy;  Laterality: N/A;  . GIVENS CAPSULE STUDY N/A 05/23/2016   Procedure: GIVENS CAPSULE STUDY;  Surgeon: Carol Ada, MD;  Location: [place];  Service: Endoscopy;  Laterality: N/A;   Family History  Problem Relation Age of Onset  . Hypertension Mother   . Hypertension Father   . Liver cancer Father    Social History:  reports that she has been smoking cigarettes. She has never used smokeless tobacco. She reports that she does not drink alcohol or use drugs. Allergies:  Allergies  Allergen Reactions  . Mango Flavor Nausea Only and Other (See Comments)    Flushing and extreme lethargy (effect is long-lasting and familial)  . Poison Ivy Extract Rash  . Sulfa Antibiotics Rash   Medications Prior to Admission  Medication Sig Dispense Refill  . Ibuprofen-diphenhydrAMINE Cit (ADVIL PM) 200-38 MG TABS Take 1 tablet by mouth at bedtime as needed (for sleep).    . mirtazapine (REMERON) 15 MG tablet Take 15 mg by mouth at bedtime.    . rosuvastatin (CRESTOR) 10 MG tablet Take 1 tablet (10 mg total) by mouth daily. (Patient not taking: Reported on 07/31/2019) 30 tablet 0    Home: Home Living Family/patient expects to be discharged to:: Private residence Additional Comments: Pt unable to provide info; attempted to call son Harrell Gave) but no answer  Functional History:  Prior Function Level of Independence: Needs assistance Comments: Per chart, pt was walking with walker, 2x falls leading to admission. Pt poor historian; attempted to call son who did not answer Functional  Status:  Mobility: Bed Mobility Overal bed mobility: Needs Assistance Bed Mobility: Supine to Sit Supine to sit: Mod assist General bed mobility comments: Pt initiating movement, but required assistance getting LEs off of bed from sidelying and lifting trunk into seated position Transfers Overall transfer level: Needs assistance Equipment used: 1 person hand held assist Transfers: Sit to/from Stand Sit to Stand: Mod assist, +2 safety/equipment General transfer comment: Pt initiating standing, holding tight onto both forearms for support, Ambulation/Gait Ambulation/Gait assistance: Mod assist, +2 safety/equipment Gait Distance (Feet): 40 Feet Assistive device: 2 person hand held assist Gait Pattern/deviations: Decreased step length - right, Step-to pattern, Decreased stride length, Decreased weight shift to left, Shuffle, Trunk flexed, Wide base of support General Gait Details: Slow, unsteady steps with HHA and modA to maintain balance; shuffling gait with RLE lagging behind, difficulty taking complete step to progress RLE forwards or backwards despite max, multimodal cues Gait velocity: Decreased Gait velocity interpretation: <1.31 ft/sec, indicative of household ambulator    ADL: ADL Overall ADL's : Needs assistance/impaired Eating/Feeding: Set up, Sitting Eating/Feeding Details (indicate cue type and reason): Increased time after set-upA Grooming: Minimal assistance, Standing Upper Body Bathing: Minimal assistance, Standing Lower Body Bathing: Maximal assistance, Sitting/lateral leans, Sit to/from stand, Cueing for safety, Cueing for sequencing Upper Body Dressing : Minimal assistance, Sitting, Cueing for safety, Cueing for sequencing Lower Body Dressing: Maximal assistance, Sitting/lateral leans, Sit to/from stand Lower Body Dressing Details (indicate cue type and reason): at EOB, attempting to perform leaning over then figure 4 technique, but pt unable to support L side of trunk  without falling to L.  Toilet Transfer: Moderate assistance, Cueing for safety, Cueing for sequencing Toileting- Clothing Manipulation and Hygiene: Moderate assistance, +2 for physical assistance, +2 for safety/equipment, Cueing for safety, Cueing for sequencing, Sitting/lateral lean, Sit to/from stand Functional mobility during ADLs: Maximal assistance, Cueing for safety, Cueing for sequencing General ADL Comments: Pt with decreased cognition for simple commands, decreased strength on R side and decreased ability to care for self.  Cognition: Cognition Overall Cognitive Status: No family/caregiver present to determine baseline cognitive functioning Orientation Level: Oriented to person, Oriented to time [employer] Scales of Cognitive Functioning: Automatic/appropriate Cognition Arousal/Alertness: Awake/alert Behavior During Therapy: Flat affect Overall Cognitive Status: No family/caregiver present to determine baseline cognitive functioning Area of Impairment: Following commands, Safety/judgement, Problem solving Orientation Level: Disoriented to, Place, Time, Situation(Pt stating name, "march", "2021") Current Attention Level: Focused, Sustained Following Commands: Follows one step commands inconsistently, Follows one step commands with increased time Awareness: Intellectual Problem Solving: Slow processing, Decreased initiation, Requires verbal cues, Requires tactile cues, Difficulty sequencing General Comments: Answered correctly 3/3 biographical yes/no questions (name, DOB, son's name). Did not verbalize unless prompted. Required question to be repeated >50% of the time.  Difficult to assess due to: Impaired communication  Blood pressure (!) 141/72, pulse 67, temperature 97.8 F (36.6 C), temperature source Oral, resp. rate 16, SpO2 97 %.   General: Alert and oriented x 3, No apparent distress HEENT: Head is normocephalic, atraumatic, PERRLA, EOMI, sclera anicteric, oral  mucosa pink and moist, dentition intact, ext ear canals clear,  Neck: Supple without JVD or lymphadenopathy Heart: Reg rate and rhythm. No murmurs rubs or gallops Chest: CTA bilaterally without wheezes, rales, or rhonchi; no distress Abdomen: Soft, non-tender, non-distended,  bowel sounds positive. Extremities: No clubbing, cyanosis, or edema. Pulses are 2+ Skin: Clean and intact without signs of breakdown Neuro: Patient is alert but frequently closes her eyes.  Oriented to self not age or place.  She does follow simple commands.  Very limited medical historian   Musculoskeletal: LUE: 4/5 throughout LLE: 4/5 throughout RUE: 3/5 SA, EF, EE, 4/5 WF and hand grip RLE: 3/5 HF, KE, 4/5 DF, PF Psych: Pt's affect is flat. Pt is cooperative   No results found for this or any previous visit (from the past 24 hour(s)). ECHOCARDIOGRAM COMPLETE  Result Date: 08/01/2019    ECHOCARDIOGRAM REPORT   Patient Name:   Yolanda Hopkins Date of Exam: 08/01/2019 Medical Rec #:  [id number]        Height:       64.0 in Accession #:    [id number]       Weight:       95.9 lb Date of Birth:  09-29-1940        BSA:          1.431 m Patient Age:    79 years         BP:           153/82 mmHg Patient Gender: F                HR:           74 bpm. Exam Location:  Inpatient Procedure: 2D Echo, Color Doppler and Cardiac Doppler Indications:    Stroke i163.9  History:        Patient has prior history of Echocardiogram examinations, most                 recent 08/20/2013. Risk Factors:Hypertension and Dyslipidemia.  Sonographer:    Irving Burton Senior RDCS Referring Phys: [id number] ARSHAD N KAKRAKANDY IMPRESSIONS  1. Left ventricular ejection fraction, by estimation, is 55 to 60%. The left ventricle has normal function. The left ventricle has no regional wall motion abnormalities. Left ventricular diastolic parameters are consistent with Grade I diastolic dysfunction (impaired relaxation).  2. Right ventricular systolic function is normal. The  right ventricular size is normal. There is normal pulmonary artery systolic pressure. The estimated right ventricular systolic pressure is 21.0 mmHg.  3. The mitral valve is grossly normal. Trivial mitral valve regurgitation.  4. The aortic valve is tricuspid. Aortic valve regurgitation is not visualized. Mild aortic valve sclerosis is present, with no evidence of aortic valve stenosis. FINDINGS  Left Ventricle: Left ventricular ejection fraction, by estimation, is 55 to 60%. The left ventricle has normal function. The left ventricle has no regional wall motion abnormalities. The left ventricular internal cavity size was normal in size. There is  borderline left ventricular hypertrophy. Left ventricular diastolic parameters are consistent with Grade I diastolic dysfunction (impaired relaxation). Right Ventricle: The right ventricular size is normal. No increase in right ventricular wall thickness. Right ventricular systolic function is normal. There is normal pulmonary artery systolic pressure. The tricuspid regurgitant velocity is 2.12 m/s, and  with an assumed right atrial pressure of 3 mmHg, the estimated right ventricular systolic pressure is 21.0 mmHg. Left Atrium: Left atrial size was normal in size. Right Atrium: Right atrial size was normal in size. Pericardium: There is no evidence of pericardial effusion. Presence of pericardial fat pad. Mitral Valve: The mitral valve is grossly normal. Mild mitral annular calcification. Trivial mitral valve regurgitation. Tricuspid Valve: The tricuspid valve is grossly normal. Tricuspid valve regurgitation  is trivial. Aortic Valve: The aortic valve is tricuspid. Aortic valve regurgitation is not visualized. Mild aortic valve sclerosis is present, with no evidence of aortic valve stenosis. Mild aortic valve annular calcification. Pulmonic Valve: The pulmonic valve was grossly normal. Pulmonic valve regurgitation is trivial. Aorta: The aortic root is normal in size and  structure. IAS/Shunts: The interatrial septum appears to be lipomatous. No atrial level shunt detected by color flow Doppler.  LEFT VENTRICLE PLAX 2D LVIDd:         3.30 cm  Diastology LVIDs:         2.40 cm  LV e' lateral:   6.20 cm/s LV PW:         0.80 cm  LV E/e' lateral: 10.2 LV IVS:        1.00 cm  LV e' medial:    5.22 cm/s LVOT diam:     1.80 cm  LV E/e' medial:  12.1 LV SV:         67 LV SV Index:   47 LVOT Area:     2.54 cm  RIGHT VENTRICLE RV S prime:     13.80 cm/s TAPSE (M-mode): 1.6 cm LEFT ATRIUM             Index       RIGHT ATRIUM           Index LA diam:        2.30 cm 1.61 cm/m  RA Area:     16.20 cm LA Vol (A2C):   51.6 ml 36.05 ml/m RA Volume:   47.20 ml  32.97 ml/m LA Vol (A4C):   31.9 ml 22.28 ml/m LA Biplane Vol: 42.1 ml 29.41 ml/m  AORTIC VALVE LVOT Vmax:   119.00 cm/s LVOT Vmean:  81.600 cm/s LVOT VTI:    0.263 m  AORTA Ao Root diam: 3.00 cm MITRAL VALVE               TRICUSPID VALVE MV Area (PHT): 2.26 cm    TR Peak grad:   18.0 mmHg MV Decel Time: 335 msec    TR Vmax:        212.00 cm/s MV E velocity: 63.00 cm/s MV A velocity: 86.10 cm/s  SHUNTS MV E/A ratio:  0.73        Systemic VTI:  0.26 m                            Systemic Diam: 1.80 cm Nona DellSamuel Mcdowell MD Electronically signed by Nona DellSamuel Mcdowell MD Signature Date/Time: 08/01/2019/11:40:08 AM    Final      Assessment/Plan: Diagnosis: Left ACA scattered infarcts 1. Does the need for close, 24 hr/day medical supervision in concert with the patient's rehab needs make it unreasonable for this patient to be served in a less intensive setting? Yes 2. Co-Morbidities requiring supervision/potential complications: abulia, flat affect, right hemiparesis, dementia, HTN, HLD, current smoker, UTI 3. Due to bladder management, bowel management, safety, skin/wound care, disease management, medication administration, pain management and patient education, does the patient require 24 hr/day rehab nursing? Yes 4. Does the patient  require coordinated care of a physician, rehab nurse, therapy disciplines of PT, OT, SLP to address physical and functional deficits in the context of the above medical diagnosis(es)? Yes Addressing deficits in the following areas: balance, endurance, locomotion, strength, transferring, bowel/bladder control, bathing, dressing, feeding, grooming, toileting, cognition, speech, language and psychosocial support 5. Can the patient actively  participate in an intensive therapy program of at least 3 hrs of therapy per day at least 5 days per week? Yes 6. The potential for patient to make measurable gains while on inpatient rehab is good 7. Anticipated functional outcomes upon discharge from inpatient rehab are min assist  with PT, min assist with OT, min assist with SLP. 8. Estimated rehab length of stay to reach the above functional goals is: 10-14 days 9. Anticipated discharge destination: Home 10. Overall Rehab/Functional Prognosis: good  RECOMMENDATIONS: This patient's condition is appropriate for continued rehabilitative care in the following setting: CIR Patient has agreed to participate in recommended program. Yes Note that insurance prior authorization may be required for reimbursement for recommended care.  Comment: Mrs. Zobel would be an excellent CIR candidate.  She does appear very tired and missed SLP today as a result; she may benefit from decreaed dose of Remeron. She did perform well with PT yesterday ambulating 40 feet ModAx2 with 2 person hand held assist. Thank you for this consult. We will continue to follow in Mrs. Muhlenkamp's care.   Mcarthur Rossetti Angiulli, PA-C 08/02/2019   I have personally performed a face to face diagnostic evaluation, including, but not limited to relevant history and physical exam findings, of this patient and developed relevant assessment and plan.  Additionally, I have reviewed and concur with the physician assistant's documentation above.  Sula Soda, MD

## 2019-08-02 NOTE — Evaluation (Signed)
Speech Language Pathology Evaluation Patient Details Name: Yolanda Hopkins MRN: [id number] DOB: 12-Oct-1940 Today's Date: 08/02/2019 Time: 2353-6144 SLP Time Calculation (min) (ACUTE ONLY): 21 min  Problem List:  Patient Active Problem List   Diagnosis Date Noted  . Acute CVA (cerebrovascular accident) ([place]) 07/30/2019  . GI bleed 05/22/2016  . Pressure injury of skin 05/22/2016  . Loss of weight   . Dyslipidemia   . Stroke ([place]) 08/19/2013  . HTN (hypertension) 08/19/2013  . Tobacco abuse 08/19/2013  . Vertigo 08/19/2013  . Gait abnormality 08/19/2013  . CVA (cerebral infarction) 08/19/2013   Past Medical History:  Past Medical History:  Diagnosis Date  . Hypertension   . Tobacco abuse 08/19/2013  . Vertigo    Past Surgical History:  Past Surgical History:  Procedure Laterality Date  . COLONOSCOPY N/A 05/23/2016   Procedure: COLONOSCOPY;  Surgeon: Carol Ada, MD;  Location: [facility] ENDOSCOPY;  Service: Endoscopy;  Laterality: N/A;  . ESOPHAGOGASTRODUODENOSCOPY N/A 05/23/2016   Procedure: ESOPHAGOGASTRODUODENOSCOPY (EGD);  Surgeon: Carol Ada, MD;  Location[facility] ENDOSCOPY;  Service: Endoscopy;  Laterality: N/A;  . GIVENS CAPSULE STUDY N/A 05/23/2016   Procedure: GIVENS CAPSULE STUDY;  Surgeon: Carol Ada, MD;  Location: [place];  Service: Endoscopy;  Laterality: N/A;   HPI:  Pt is an 79 y.o. female with past medical history significant for hypertension, tobacco abuse, dementia, tobacco abuse, prior stroke who was brought to the ED secondary to difficulty walking with patient dragging her right lower extremity. MRI brain: Multiple small acute infarcts within the left frontal white matter, in the left anterior cerebral artery territory.    Assessment / Plan / Recommendation Clinical Impression  Pt was seen for a cognitive-linguistic evaluation in the setting of acute infarcts.  Pt's daughter was present at bedside and was able to provide the pt's baseline hx.  Per daughter  report, pt resides with her son and daughter and she has a care assistant 5 days a week for about 6 hours a day.  Daughter additionally reported that the pt was not displaying sign of cognitive decline, but that she was receiving full assistance for all IADLs (medications, finances, meals, etc.) at baseline.  Daughter stated that she noticed an acute change in the pt's speech since this admission.  Pt was awake/alert throughout this evaluation and she was observed to have few spontaneous utterances.  Portions of the Western Aphasia Battery (WAB) Bedside were administered in addition to informal measures.  Pt presents most consistently with transcortical motor aphasia and she demonstrated difficulty with fluent speech and complex commands.  Pt followed 1-2 step commands, completed confrontational naming and responsive naming tasks, repeated words and phrases, completed automatic speech tasks, and answered yes/no questions with 100% accuracy independently.  Pt was observed to have delayed responses and she benefited from extra time.  She had difficulty answering open-ended questions and benefited from yes/no questions during communication tasks.  Oral motor examination was WNL and no dysarthria was observed.  Speech intelligibility was mildly reduced secondary to low volume of speech.  Cognition was not formally evaluated on this date secondary to language deficits.  Recommend acute ST while pt is in the hospital and additional ST 5x weekly (CIR) targeting language deficits at time of discharge.  Pt's daughter was educated regarding results and recommendations and she verbalized understanding.  SLP will f/u per POC.      SLP Assessment  SLP Recommendation/Assessment: Patient needs continued Speech Lanaguage Pathology Services SLP Visit Diagnosis: Aphasia (R47.01);Cognitive communication deficit (  R41.841)    Follow Up Recommendations  Inpatient Rehab    Frequency and Duration min 2x/week  2 weeks      SLP  Evaluation Cognition  Overall Cognitive Status: Impaired/Different from baseline Arousal/Alertness: Awake/alert Orientation Level: Oriented to person;Disoriented to situation;Disoriented to place Comments: Cognition not formally evaluated secondary to language deficits        Comprehension  Auditory Comprehension Overall Auditory Comprehension: Impaired Yes/No Questions: Within Functional Limits Commands: Impaired One Step Basic Commands: 75-100% accurate Two Step Basic Commands: 75-100% accurate Multistep Basic Commands: 50-74% accurate Conversation: Simple Interfering Components: Processing speed EffectiveTechniques: Extra processing time;Repetition Reading Comprehension Reading Status: Not tested    Expression Expression Primary Mode of Expression: Verbal Verbal Expression Overall Verbal Expression: Impaired Automatic Speech: Name;Counting Level of Generative/Spontaneous Verbalization: Word Repetition: No impairment Naming: No impairment Pragmatics: Impairment Impairments: Monotone;Abnormal affect Non-Verbal Means of Communication: Not applicable Written Expression Dominant Hand: Left Written Expression: Not tested   Oral / Motor  Oral Motor/Sensory Function Overall Oral Motor/Sensory Function: Within functional limits Motor Speech Overall Motor Speech: Appears within functional limits for tasks assessed Respiration: Within functional limits Phonation: Normal Resonance: Within functional limits Articulation: Within functional limitis Intelligibility: Intelligible   GO                   Villa Herb., M.S., CCC-SLP Acute Rehabilitation Services Office: [phone]  Shanon Rosser [facility] 08/02/2019, 12:11 PM

## 2019-08-02 NOTE — Progress Notes (Signed)
Inpatient Rehabilitation-Admissions Coordinator   Inpatient Rehab Admissions:   I met with pt and her duaghter at the bedside as follow up from PM&R consult to discuss goals and expectations of an inpatient rehab admission. Pt's daughter very interested in this program and wanted me to speak with her brother prior to making a final decision to move forward with this program. I was able to speak with Harrell Gave and confirm his support as well as 24/7 A at DC. Feel this patient is an appropriate candidate for CIR at this time. Discussed that I will follow up over the next day or two when we have a bed available for possible admit.   Raechel Ache, OTR/L  Rehab Admissions Coordinator  [phone] 08/02/2019 5:38 PM

## 2019-08-02 NOTE — Progress Notes (Addendum)
PROGRESS NOTE  SYLVANA BONK YHC:[id number] DOB: 1940-07-05 DOA: 07/30/2019 PCP: [facility]  Brief History    Yolanda Hopkins is a 79 y.o. female with history of previous stroke presently only taking Remeron for depression was brought to the ER after patient's son noted that patient has been having difficulty walking with patient dragging her right lower extremity since yesterday morning.  Over the last 2 days patient has been having increasing frequency of urination and appeared to be mildly confused.  Yesterday morning around 2 AM patient went to the bathroom to urinate and had a fall but did not lose consciousness and again had another fall 2 hours later.  From that point patient was found to have increasing difficulty walking dragging her right lower extremity and was using a walker.  Patient has been with social service from December to 06-01-2022 and was back to the family in 2022/06/01.  In 2022/06/01 patient's husband had died.  Since then patient has been some depression and was restarted on Remeron.  Patient also had a fracture of the right hip in December 2020 which was managed surgically.  ED Course: In the ER on exam patient is able to move all extremities with mild weakness of the right lower extremity.  Able to walk oriented to name and place.  MRI of the brain shows multiple embolic areas in the left frontal and the right cerebellar area.  EKG shows normal sinus rhythm.  On-call neurologist has been consulted admit for further management of acute CVA.  Labs largely unremarkable.  UA shows possibility of UTI with moderate leukocyte esterase and WBC count 11-20.  Was started on empiric antibiotics urine cultures obtained.  X-ray of the right hip shows some soft tissues swelling but no fracture.  The patient was admitted to a telemetry bed. Neurology was consulted. Neurology has been consulted. Work up thus far demonstrates multiple embolic infarcted areas in the left frontal and  the right cerebellar area. CTA head and neck demonstrated no intracranial arterial occlusion or high-grade stenosis. 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 Echocardiogram has demonstrated an EF of 55 - 60% with normal function. There is borderline left ventricular hypertrophy. There is Grade 1 diastolic dysfunction. RV function and size is normal. The interatrial septum is lipomatous. There is no atrial level shunt detected. There is no intracardiac thrombus reported. The patient has been evaluated by PT/OT. Their recommendation is for CIR. Rehab has been consulted, and the patient is awaiting placement.  Urinalysis is suggestive of UTI. The patient has been started on Rocephin.  The patient has been evaluated for CIR placement and has been accepted, although she is now awaiting insurance approval. PM&R has recommended a lower dose of remeron to improve the patient's ability to participate with therapies. This has been done.  Consultants  . Neurology . PM&R  Procedures  . none  Antibiotics   Anti-infectives (From admission, onward)   Start     Dose/Rate Route Frequency Ordered Stop   07/31/19 0600  cefTRIAXone (ROCEPHIN) 1 g in sodium chloride 0.9 % 100 mL IVPB     1 g 200 mL/hr over 30 Minutes Intravenous Every 24 hours 07/31/19 0557       Subjective  The patient is resting comfortably. She is verbal and appropriate today. No new complaints.  Objective   Vitals:  Vitals:   08/02/19 0359 08/02/19 0754  BP: (!) 141/72 (!) 164/85  Pulse:  67 73  Resp: 16 20  Temp: 97.8 F (36.6 C) 97.9 F (36.6 C)  SpO2: 97% 97%   Exam:  Constitutional:  . The patient is awake, alert, and conversant with me today. No acute distress. Eyes:  . pupils and irises appear normal . Normal lids and conjunctivae Respiratory:  . No increased work of breathing. . No wheezes, rales, or rhonchi . No tactile fremitus Cardiovascular:  . Regular  rate and rhythm . No murmurs, ectopy, or gallups. . No lateral PMI. No thrills. Abdomen:  . Abdomen is soft, non-tender, non-distended . No hernias, masses, or organomegaly . Normoactive bowel sounds.  Musculoskeletal:  . No cyanosis, clubbing, or edema Skin:  . No rashes, lesions, ulcers . palpation of skin: no induration or nodules Neurologic:  . CN 2-12 intact . Sensation all 4 extremities intact . Pt appears confused. Not verbal which I believe is a change from admission. Psychiatric:  . Unable to evaluate due to the patient's inability to participate in exam.  I have personally reviewed the following:   Today's Data  . Vitals, echocardiogram .  Micro Data  . Urine culture  Scheduled Meds: . aspirin EC  325 mg Oral Daily  . atorvastatin  40 mg Oral q1800  . clopidogrel  75 mg Oral Daily  . enoxaparin (LOVENOX) injection  40 mg Subcutaneous Q24H  . mirtazapine  7.5 mg Oral QHS  . nicotine  7 mg Transdermal Daily  . sodium chloride flush  10-40 mL Intracatheter Q12H   Continuous Infusions: . cefTRIAXone (ROCEPHIN)  IV Stopped (08/02/19 0929)    Principal Problem:   Acute CVA (cerebrovascular accident) ([facility]) Active Problems:   HTN (hypertension)   LOS: 3 days   Acute Left Frontal and left cerebellar infarct: Echocardiogram pending. CTA head and neck demonstrated no intracranial arterial occlusion or high-grade stenosis. 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  Echocardiogram has demonstrated an EF of 55 - 60% with normal function. There is borderline left ventricular hypertrophy. There is Grade 1 diastolic dysfunction. RV function and size is normal. The interatrial septum is lipomatous. There is no atrial level shunt detected. There is no intracardiac thrombus reported. The patient has been evaluated by PT/OT. Their recommendation is for CIR. Rehab has been consulted, and and has been accepted, although she is now  awaiting insurance approval. PM&R has recommended a lower dose of remeron to improve the patient's ability to participate with therapies. This has been done.  UTI: Urinalysis is suggestive of UTI. Urine culture has had no growth. The patient has completed a 3 day course of Rocephin.  Hypertension: Patient is currently normotensive. Monitor  Hyperlipidemia: Increase Atorvastatin to 40 mg. LDL is 105 with a new stroke.  Dementia: Noted. Pt has been continued on Remeron, I have reduced dose according to the recommendations of PM&R.  I have seen and examined this patient myself. I have spent 30 minutes in her evaluation and care.  DVT Prophylaxis: Lovenox CODE STATUS: Full Code Family Communication: None available Disposition: From Home. PT/OT has recommended CIR. Their recommendation is for CIR. Rehab has been consulted, and the patient is awaiting placement. Patient is medically cleared for dc.  Maxxon Schwanke, DO 08/02/2019 3:34

## 2019-08-03 LAB — GLUCOSE, CAPILLARY: Glucose-Capillary: 93 mg/dL (ref 70–99)

## 2019-08-03 NOTE — Progress Notes (Signed)
Physical Therapy Treatment Patient Details Name: Yolanda Hopkins MRN: [id number] DOB: 1940/12/08 Today's Date: 08/03/2019    History of Present Illness Pt is an 79 y.o. female admitted 07/30/19 after son noted pt dragging RLE was confused and had two falls. 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 Possible UTI. R hip imaging without fx. PMH includes recent R hip fx (04/2019), HTN, vertigo, depression.    PT Comments    Continuing work on functional mobility and activity tolerance;   Pt was recieved in recliner with daughter [name]. Session focused on coordination with gait specifically weight shift on to left stance to allow for right advancement; participated with encouragement. During gait in hallway nursing informed us of telebox reading significant tachycardia of HR in 180s. Sat down to rest until HR stablized in relative 120s. Continued gait training, employed multiple cues for coordination including short verble cues and manual facilitation. Returned to recliner, pt reported "hurt" when asked if she was feeling pain; further queries revealed she had discomfort with the purewick. Reported to nursing. PT continues to recommend CIR for continued therapies to maximize indepndence and safety with functional mobility and ADLs.    Follow Up Recommendations  CIR;Supervision/Assistance - 24 hour     Equipment Recommendations  Rolling walker with 5" wheels    Recommendations for Other Services       Precautions / Restrictions Precautions Precautions: Fall Precaution Comments: cognitive deficits Restrictions Weight Bearing Restrictions: No    Mobility  Bed Mobility                  Transfers Overall transfer level: Needs assistance Equipment used: Rolling walker (2 wheeled) Transfers: Sit to/from Stand Sit to Stand: Mod assist;+2 safety/equipment         General transfer comment: Multimodal  cues to initiate sit to stand; Mod assist to fullly rise to standing, and cues for anterior weght shift  Ambulation/Gait Ambulation/Gait assistance: Mod assist;+2 safety/equipment   Assistive device: Rolling walker (2 wheeled) Gait Pattern/deviations: Decreased step length - right;Step-to pattern;Decreased stride length;Decreased weight shift to left;Shuffle;Trunk flexed;Wide base of support Gait velocity: Decreased   General Gait Details: Slow, unsteady steps with HHA and modA to maintain balance; shuffling gait with RLE lagging behind, difficulty taking complete step to progress RLE forwards or backwards despite max, multimodal cues   Stairs             Wheelchair Mobility    Modified Rankin (Stroke Patients Only) Modified Rankin (Stroke Patients Only) Pre-Morbid Rankin Score: Slight disability Modified Rankin: Moderately severe disability     Balance Overall balance assessment: Needs assistance;History of Falls Sitting-balance support: Bilateral upper extremity supported Sitting balance-Leahy Scale: Poor     Standing balance support: Bilateral upper extremity supported Standing balance-Leahy Scale: Poor                              Cognition Arousal/Alertness: Awake/alert Behavior During Therapy: Flat affect Overall Cognitive Status: Impaired/Different from baseline Area of Impairment: Following commands;Safety/judgement;Problem solving                 Orientation Level: Disoriented to;Place;Time;Situation Current Attention Level: Alternating   Following Commands: Follows one step commands inconsistently;Follows one step commands with increased time   Awareness: Intellectual Problem Solving: Slow processing;Decreased initiation;Requires verbal cues;Requires tactile cues;Difficulty sequencing General Comments: Was able to respond to verbal cues  and answer yes/no questions; answered closed-ended questions about 75% of the times; alternated  attention between individuals in the room      Exercises      General Comments        Pertinent Vitals/Pain      Home Living                      Prior Function            PT Goals (current goals can now be found in the care plan section) Acute Rehab PT Goals Patient Stated Goal: None stated PT Goal Formulation: With family Time For Goal Achievement: 08/14/19 Potential to Achieve Goals: Fair    Frequency    Min 4X/week      PT Plan Current plan remains appropriate    Co-evaluation     PT goals addressed during session: Mobility/safety with mobility;Balance        AM-PAC PT "6 Clicks" Mobility   Outcome Measure  Help needed turning from your back to your side while in a flat bed without using bedrails?: A Lot Help needed moving from lying on your back to sitting on the side of a flat bed without using bedrails?: A Lot Help needed moving to and from a bed to a chair (including a wheelchair)?: A Lot Help needed standing up from a chair using your arms (e.g., wheelchair or bedside chair)?: A Lot Help needed to walk in hospital room?: A Lot Help needed climbing 3-5 steps with a railing? : A Lot 6 Click Score: 12    End of Session Equipment Utilized During Treatment: Gait belt Activity Tolerance: Patient tolerated treatment well(pt stated she was in pain at end of Tx) Patient left: in chair;with call bell/phone within reach;with chair alarm set;Other (comment);with family/visitor present Nurse Communication: Other (comment)(pt uncomfortable with PureWick) PT Visit Diagnosis: Unsteadiness on feet (R26.81);Other abnormalities of gait and mobility (R26.89);History of falling (Z91.81);Muscle weakness (generalized) (M62.81);Other symptoms and signs involving the nervous system (R29.898)     Time: 6384-6659 PT Time Calculation (min) (ACUTE ONLY): 34 min  Charges:  $Gait Training: 23-37 mins                     Van Clines, [facility] Pager [phone] Office [phone]    Levi Aland 08/03/2019, 5:51 PM

## 2019-08-03 NOTE — Progress Notes (Signed)
PROGRESS NOTE  Yolanda Hopkins CZY:[id number] DOB: 1941-02-07 DOA: 07/30/2019 [facility]CP: [facility]  Brief History    Yolanda Hopkins is a 79 y.o. female with history of previous stroke presently only taking Remeron for depression was brought to the ER after patient's son noted that patient has been having difficulty walking with patient dragging her right lower extremity since yesterday morning.  Over the last 2 days patient has been having increasing frequency of urination and appeared to be mildly confused.  Yesterday morning around 2 AM patient went to the bathroom to urinate and had a fall but did not lose consciousness and again had another fall 2 hours later.  From that point patient was found to have increasing difficulty walking dragging her right lower extremity and was using a walker.  Patient has been with social service from December to 28-May-2022 and was back to the family in 2022-05-28.  In 05/28/22 patient's husband had died.  Since then patient has been some depression and was restarted on Remeron.  Patient also had a fracture of the right hip in December 2020 which was managed surgically.  ED Course: In the ER on exam patient is able to move all extremities with mild weakness of the right lower extremity.  Able to walk oriented to name and place.  MRI of the brain shows multiple embolic areas in the left frontal and the right cerebellar area.  EKG shows normal sinus rhythm.  On-call neurologist has been consulted admit for further management of acute CVA.  Labs largely unremarkable.  UA shows possibility of UTI with moderate leukocyte esterase and WBC count 11-20.  Was started on empiric antibiotics urine cultures obtained.  X-ray of the right hip shows some soft tissues swelling but no fracture.  The patient was admitted to a telemetry bed. Neurology was consulted. Neurology has been consulted. Work up thus far demonstrates multiple embolic infarcted areas in the left frontal and  the right cerebellar area. CTA head and neck demonstrated no intracranial arterial occlusion or high-grade stenosis. 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 Echocardiogram has demonstrated an EF of 55 - 60% with normal function. There is borderline left ventricular hypertrophy. There is Grade 1 diastolic dysfunction. RV function and size is normal. The interatrial septum is lipomatous. There is no atrial level shunt detected. There is no intracardiac thrombus reported. The patient has been evaluated by PT/OT. Their recommendation is for CIR. Rehab has been consulted, and the patient is awaiting placement.  Urinalysis is suggestive of UTI. The patient has been started on Rocephin.  The patient has been evaluated for CIR placement and has been accepted, although she is now awaiting insurance approval. The patient is medically cleared for discharge. PM&R has recommended a lower dose of remeron to improve the patient's ability to participate with therapies. This has been done.  Consultants  . Neurology . PM&R  Procedures  . none  Antibiotics   Anti-infectives (From admission, onward)   Start     Dose/Rate Route Frequency Ordered Stop   07/31/19 0600  cefTRIAXone (ROCEPHIN) 1 g in sodium chloride 0.9 % 100 mL IVPB     1 g 200 mL/hr over 30 Minutes Intravenous Every 24 hours 07/31/19 0557       Subjective  The patient is resting comfortably. She is verbal and appropriate today. No new complaints.  Objective   Vitals:  Vitals:   08/03/19 0932 08/03/19 1608  BP: 131/72 110/64  Pulse: 74 82  Resp: 14 18  Temp: 98.3 F (36.8 C) 98.1 F (36.7 C)  SpO2: 97%    Exam:  Constitutional:  . The patient is awake, alert, and conversant with me today. No acute distress. Eyes:  . pupils and irises appear normal . Normal lids and conjunctivae Respiratory:  . No increased work of breathing. . No wheezes, rales, or rhonchi . No tactile  fremitus Cardiovascular:  . Regular rate and rhythm . No murmurs, ectopy, or gallups. . No lateral PMI. No thrills. Abdomen:  . Abdomen is soft, non-tender, non-distended . No hernias, masses, or organomegaly . Normoactive bowel sounds.  Musculoskeletal:  . No cyanosis, clubbing, or edema Skin:  . No rashes, lesions, ulcers . palpation of skin: no induration or nodules Neurologic:  . CN 2-12 intact . Sensation all 4 extremities intact . Pt appears confused. Not verbal which I believe is a change from admission. Psychiatric:  . Unable to evaluate due to the patient's inability to participate in exam.  I have personally reviewed the following:   Today's Data  . Vitals, echocardiogram .  Micro Data  . Urine culture  Scheduled Meds: . aspirin EC  325 mg Oral Daily  . atorvastatin  40 mg Oral q1800  . clopidogrel  75 mg Oral Daily  . enoxaparin (LOVENOX) injection  40 mg Subcutaneous Q24H  . mirtazapine  7.5 mg Oral QHS  . nicotine  7 mg Transdermal Daily  . sodium chloride flush  10-40 mL Intracatheter Q12H   Continuous Infusions: . cefTRIAXone (ROCEPHIN)  IV Stopped (08/03/19 6659)    Principal Problem:   Acute CVA (cerebrovascular accident) ([place]) Active Problems:   HTN (hypertension)   LOS: 4 days   Acute Left Frontal and left cerebellar infarct: Echocardiogram pending. CTA head and neck demonstrated no intracranial arterial occlusion or high-grade stenosis. 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  Echocardiogram has demonstrated an EF of 55 - 60% with normal function. There is borderline left ventricular hypertrophy. There is Grade 1 diastolic dysfunction. RV function and size is normal. The interatrial septum is lipomatous. There is no atrial level shunt detected. There is no intracardiac thrombus reported. The patient has been evaluated by PT/OT. Their recommendation is for CIR. Rehab has been consulted, and and  has been accepted, although she is now awaiting insurance approval. PM&R has recommended a lower dose of remeron to improve the patient's ability to participate with therapies. This has been done.  UTI: Urinalysis is suggestive of UTI. Urine culture has had no growth. The patient has completed a 3 day course of Rocephin.  Hypertension: Patient is currently normotensive. Monitor  Hyperlipidemia: Increase Atorvastatin to 40 mg. LDL is 105 with a new stroke.  Dementia: Noted. Pt has been continued on Remeron, I have reduced dose according to the recommendations of PM&R.  I have seen and examined this patient myself. I have spent 30 minutes in her evaluation and care.  DVT Prophylaxis: Lovenox CODE STATUS: Full Code Family Communication: None available Disposition: From Home. PT/OT has recommended CIR. Their recommendation is for CIR. Rehab has been consulted, and the patient is awaiting placement. Patient is medically cleared for dc.  Keani Gotcher, DO 08/03/2019 5:03 P.M.

## 2019-08-04 DIAGNOSIS — I10 Essential (primary) hypertension: Secondary | ICD-10-CM

## 2019-08-04 LAB — BASIC METABOLIC PANEL
Anion gap: 8 (ref 5–15)
Anion gap: 9 (ref 5–15)
BUN: 24 mg/dL — ABNORMAL HIGH (ref 8–23)
BUN: 26 mg/dL — ABNORMAL HIGH (ref 8–23)
CO2: 24 mmol/L (ref 22–32)
CO2: 26 mmol/L (ref 22–32)
Calcium: 9.4 mg/dL (ref 8.9–10.3)
Calcium: 9.5 mg/dL (ref 8.9–10.3)
Chloride: 107 mmol/L (ref 98–111)
Chloride: 103 mmol/L (ref 98–111)
Creatinine, Ser: 0.71 mg/dL (ref 0.44–1.00)
Creatinine, Ser: 0.87 mg/dL (ref 0.44–1.00)
GFR calc Af Amer: >60 mL/min (ref >60)
GFR calc Af Amer: >60 mL/min (ref >60)
GFR calc non Af Amer: >60 mL/min (ref >60)
GFR calc non Af Amer: >60 mL/min (ref >60)
Glucose, Bld: 97 mg/dL (ref 70–99)
Glucose, Bld: 111 mg/dL — ABNORMAL HIGH (ref 70–99)
Potassium: 5.7 mmol/L — ABNORMAL HIGH (ref 3.5–5.1)
Potassium: 4.4 mmol/L (ref 3.5–5.1)
Sodium: 139 mmol/L (ref 135–145)
Sodium: 138 mmol/L (ref 135–145)

## 2019-08-04 LAB — GLUCOSE, CAPILLARY: Glucose-Capillary: 101 mg/dL — ABNORMAL HIGH (ref 70–99)

## 2019-08-04 MED ORDER — ATORVASTATIN CALCIUM 40 MG PO TABS: 40.0000 mg | ORAL_TABLET | Freq: Every day | ORAL | Status: DC

## 2019-08-04 MED ORDER — CLOPIDOGREL BISULFATE 75 MG PO TABS: 75.0000 mg | ORAL_TABLET | Freq: Every day | ORAL | Status: DC

## 2019-08-04 MED ORDER — POLYETHYLENE GLYCOL 3350 17 G PO PACK
17.0000 g | PACK | Freq: Two times a day (BID) | ORAL | Status: DC
  Administered 2019-08-04 – 2019-08-05 (×2): 17 g via ORAL
  Filled 2019-08-04 (×2): qty 1

## 2019-08-04 MED ORDER — FLEET ENEMA 7-19 GM/118ML RE ENEM
1.0000 | ENEMA | Freq: Every day | RECTAL | Status: DC | PRN
  Administered 2019-08-05: 1 via RECTAL
  Filled 2019-08-04: qty 1

## 2019-08-04 MED ORDER — ASPIRIN 325 MG PO TBEC: 325.0000 mg | DELAYED_RELEASE_TABLET | Freq: Every day | ORAL | 0 refills | Status: DC

## 2019-08-04 NOTE — Progress Notes (Signed)
PROGRESS NOTE    BRITTON PERKINSON  ONG:[id number] DOB: 1940-11-22 DOA: 07/30/2019 PCP: [facility]    Brief Narrative: 79 year old lady with prior history of CVA and hypertension presents to ED with difficulty walking and dragging her right lower extremity since Monday.  She was admitted for evaluation of stroke and neurology consulted.   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 MRI of the brain shows multiple small acute infarcts within the left frontal white matter area in the left anterior cerebral artery territory.  Multiple old infarcts seen in the basal ganglia and cerebellum. His 2D echocardiogram showed left ventricular ejection fraction of 55 to 60%, no cardiac source of emboli identified. LDL is 158, hemoglobin A1c is 5.3. Therapy eval's recommending CIR. Patient is medically stable to be discharged to CIR once bed is available.   Hyperlipidemia Continue with statin.   Dementia Follow-up with neurology as outpatient.    Essential hypertension Blood pressure at goal.    History of tobacco abuse Smoking cessation counseling provided.    Mild urinary tract infection Complete 3 days of IV rocephin.  DVT prophylaxis:  Lovenox.  Code Status: full code.  Family Communication: none at bedside.) Disposition Plan:  . Waiting for CIR bed. She is medically stable.    Consultants:   Neurology.    Subjective: No new complaints.   Objective: Vitals:   08/03/19 2025 08/03/19 2328 08/04/19 0343 08/04/19 0839  BP: 137/72 137/77 (!) 103/54 (!) 152/84  Pulse: 80 84 71 82  Resp: 16 17 14 16   Temp: 98.5 F (36.9 C) (!) 97.5 F (36.4 C) 98.1 F (36.7  C) 98.3 F (36.8 C)  TempSrc: Oral Oral Oral Oral  SpO2: 95% 98% 98% 96%    Intake/Output Summary (Last 24 hours) at 08/04/2019 1427 Last data filed at 08/04/2019 1320 Gross per 24 hour  Intake 760 ml  Output 1300 ml  Net -540 ml   There were no vitals filed for this visit.  Examination:  General exam: Appears calm and comfortable  Respiratory system: Clear to auscultation. Respiratory effort normal. Cardiovascular system: S1 & S2 heard, RRR. No JVD,  No pedal edema. Gastrointestinal system: Abdomen is nondistended, soft and nontender.  Normal bowel sounds heard. Central nervous system: Alert and oriented. No focal neurological deficits. Extremities: Symmetric 5 x 5 power. Skin: No rashes, lesions or ulcers Psychiatry: Mood & affect appropriate.     Data Reviewed: I have personally reviewed following labs and imaging studies  CBC: Recent Labs  Lab 07/30/19 1734 07/31/19 0015  WBC 5.9 4.9  HGB 14.2 14.5  HCT 43.7 44.4  MCV 98.9 97.8  PLT 325 440   Basic Metabolic Panel: Recent Labs  Lab 07/30/19 1734 07/31/19 0015 08/04/19 0500  NA 140  --  139  K 4.0  --  5.7*  CL 101  --  107  CO2 26  --  24  GLUCOSE 97  --  97  BUN 24*  --  24*  CREATININE 0.86 0.61 0.71  CALCIUM 10.1  --  9.4   GFR: CrCl cannot be calculated (Unknown ideal weight.). Liver Function Tests: No results for input(s): AST, ALT, ALKPHOS, BILITOT, PROT, ALBUMIN in the last 168  hours. No results for input(s): LIPASE, AMYLASE in the last 168 hours. No results for input(s): AMMONIA in the last 168 hours. Coagulation Profile: No results for input(s): INR, PROTIME in the last 168 hours. Cardiac Enzymes: No results for input(s): CKTOTAL, CKMB, CKMBINDEX, TROPONINI in the last 168 hours. BNP (last 3 results) No results for input(s): PROBNP in the last 8760 hours. HbA1C: No results for input(s): HGBA1C in the last 72 hours. CBG: Recent Labs  Lab 08/03/19 0606 08/04/19 1124  GLUCAP 93 101*     Lipid Profile: No results for input(s): CHOL, HDL, LDLCALC, TRIG, CHOLHDL, LDLDIRECT in the last 72 hours. Thyroid Function Tests: No results for input(s): TSH, T4TOTAL, FREET4, T3FREE, THYROIDAB in the last 72 hours. Anemia Panel: No results for input(s): VITAMINB12, FOLATE, FERRITIN, TIBC, IRON, RETICCTPCT in the last 72 hours. Sepsis Labs: No results for input(s): PROCALCITON, LATICACIDVEN in the last 168 hours.  Recent Results (from the past 240 hour(s))  Urine culture     Status: Abnormal   Collection Time: 07/30/19 11:03 PM   Specimen: Urine, Random  Result Value Ref Range Status   Specimen Description URINE, RANDOM  Final   Special Requests   Final    NONE Performed at [facility], [address], [place], [place] [address]    Culture <10,000 COLONIES/mL INSIGNIFICANT GROWTH (A)  Final   Report Status 08/01/2019 FINAL  Final  SARS CORONAVIRUS 2 (TAT 6-24 HRS) Nasopharyngeal Nasopharyngeal Swab     Status: None   Collection Time: 07/30/19 11:34 PM   Specimen: Nasopharyngeal Swab  Result Value Ref Range Status   SARS Coronavirus 2 NEGATIVE NEGATIVE Final    Comment: (NOTE) SARS-CoV-2 target nucleic acids are NOT DETECTED. The SARS-CoV-2 RNA is generally detectable in upper and lower respiratory specimens during the acute phase of infection. Negative results do not preclude SARS-CoV-2 infection, do not rule out co-infections with other pathogens, and should not be used as the sole basis for treatment or other patient management decisions. Negative results must be combined with clinical observations, patient history, and epidemiological information. The expected result is Negative. Fact Sheet for Patients: HairSlick.no Fact Sheet for Healthcare Providers: quierodirigir.com This test is not yet approved or cleared by the [place] FDA and  has been authorized for detection and/or diagnosis of SARS-CoV-2  by FDA under an Emergency Use Authorization (EUA). This EUA will remain  in effect (meaning this test can be used) for the duration of the COVID-19 declaration under Section 56 4(b)(1) of the Act, 21 U.S.C. section 360bbb-3(b)(1), unless the authorization is terminated or revoked sooner. Performed at [facility], [address], [place], [place] [address]          Radiology Studies: No results found.      Scheduled Meds: . aspirin EC  325 mg Oral Daily  . atorvastatin  40 mg Oral q1800  . clopidogrel  75 mg Oral Daily  . enoxaparin (LOVENOX) injection  40 mg Subcutaneous Q24H  . mirtazapine  7.5 mg Oral QHS  . nicotine  7 mg Transdermal Daily  . sodium chloride flush  10-40 mL Intracatheter Q12H   Continuous Infusions: . cefTRIAXone (ROCEPHIN)  IV 1 g (08/04/19 0557)     LOS: 5 days        Kathlen Mody, MD [facility]   To contact the attending provider between 7A-7P or the covering provider during after hours 7P-7A, please log into the web site [email or web address] and access using universal  [place] password for that web site. If you do not have the password, please call the hospital operator.  08/04/2019, 2:27 PM

## 2019-08-04 NOTE — Plan of Care (Signed)
Plan of care reviewed with pt at bedside. VSS, denies pain. Safety measures in place. Call bell in reach. Pt stable at this time, will continue to monitor.  Problem: Education: Goal: Knowledge of General Education information will improve Description: Including pain rating scale, medication(s)/side effects and non-pharmacologic comfort measures Outcome: Progressing   Problem: Health Behavior/Discharge Planning: Goal: Ability to manage health-related needs will improve Outcome: Progressing   Problem: Clinical Measurements: Goal: Ability to maintain clinical measurements within normal limits will improve Outcome: Progressing Goal: Will remain free from infection Outcome: Progressing Goal: Diagnostic test results will improve Outcome: Progressing Goal: Respiratory complications will improve Outcome: Progressing Goal: Cardiovascular complication will be avoided Outcome: Progressing   Problem: Activity: Goal: Risk for activity intolerance will decrease Outcome: Progressing   Problem: Nutrition: Goal: Adequate nutrition will be maintained Outcome: Progressing   Problem: Coping: Goal: Level of anxiety will decrease Outcome: Progressing   Problem: Elimination: Goal: Will not experience complications related to bowel motility Outcome: Progressing Goal: Will not experience complications related to urinary retention Outcome: Progressing   Problem: Pain Managment: Goal: General experience of comfort will improve Outcome: Progressing   Problem: Safety: Goal: Ability to remain free from injury will improve Outcome: Progressing   Problem: Skin Integrity: Goal: Risk for impaired skin integrity will decrease Outcome: Progressing   Problem: Education: Goal: Knowledge of disease or condition will improve Outcome: Progressing Goal: Knowledge of secondary prevention will improve Outcome: Progressing Goal: Knowledge of patient specific risk factors addressed and post discharge  goals established will improve Outcome: Progressing

## 2019-08-04 NOTE — Progress Notes (Signed)
Inpatient Rehabilitation-Admissions Coordinator   I do not have a bed available for this patient today in IP Rehab. AC will follow up with pt tomorrow if a bed becomes available for her.   Cheri Rous, OTR/L  Rehab Admissions Coordinator  [phone] 08/04/2019 12:05 PM

## 2019-08-04 NOTE — Discharge Summary (Signed)
Physician Discharge Summary  Yolanda Hopkins PXT:[id number] DOB: 05/08/1941 DOA: 07/30/2019  PCP: [facility]  Admit date: 07/30/2019 Discharge date: 08/05/2019  Admitted From: Home Disposition: CIR  Recommendations for Outpatient Follow-up:  1. Follow up with PCP in 1-2 weeks 2. Please obtain BMP/CBC in one week 3. Please follow up with neurology as recommended   Discharge Condition: Guarded CODE STATUS: Full code Diet recommendation: Heart Healthy   Brief/Interim Summary: 79 year old lady with prior history of CVA and hypertension presents to ED with difficulty walking and dragging her right lower extremity since Monday.  She was admitted for evaluation of stroke and neurology consulted.  Discharge Diagnoses:  Principal Problem:   Acute CVA (cerebrovascular accident) (HCC) Active Problems:   HTN (hypertension)  ACA scattered infarcts likely due to large vessel atherosclerosis Currently on aspirin 325 mg daily and clopidogrel 75 mg daily DAPT for 3 months and then aspirin alone due to severe left ACA stenosis. MRI of the brain shows multiple small acute infarcts within the left frontal white matter area in the left anterior cerebral artery territory.  Multiple old infarcts seen in the basal ganglia and cerebellum. His 2D echocardiogram showed left ventricular ejection fraction of 55 to 60%, no cardiac source of emboli identified. LDL is 158, hemoglobin A1c is 5.3. Therapy eval's recommending CIR. Patient is medically stable to be discharged to CIR once bed is available.   Hyperlipidemia Continue with statin.   Dementia Follow-up with neurology as outpatient.    Essential hypertension Blood pressure at goal.    History of tobacco abuse Smoking cessation counseling provided.    Mild urinary tract infection Currently on Rocephin. Plan to DC Rocephin after today's dose.  Discharge Instructions  Discharge Instructions    Ambulatory referral to  Neurology   Complete by: As directed    Follow up with stroke clinic NP (Jessica Vanschaick or Darrol Angel, if both not available, consider Manson Allan, or Ahern) at [facility] in about 4 weeks. Thanks.   Face-to-face encounter (required for Medicare/Medicaid patients)   Complete by: As directed    I Audree Camel certify that this patient is under my care and that I, or a nurse practitioner or physician's assistant working with me, had a face-to-face encounter that meets the physician face-to-face encounter requirements with this patient on 07/30/2019. The encounter with the patient was in whole, or in part for the following medical condition(s) which is the primary reason for home health care (List medical condition): right leg pain   The encounter with the patient was in whole, or in part, for the following medical condition, which is the primary reason for home health care: right leg pain   I certify that, based on my findings, the following services are medically necessary home health services:  Nursing Physical therapy     Reason for Medically Necessary Home Health Services: Skilled Nursing- Change/Decline in Patient Status   My clinical findings support the need for the above services: Pain interferes with ambulation/mobility   Further, I certify that my clinical findings support that this patient is homebound due to: Unsafe ambulation due to balance issues   Home Health   Complete by: As directed    To provide the following care/treatments:  PT OT RN Social work Home Health Aide       Allergies as of 08/04/2019      Reactions   Mango Flavor Nausea Only, Other (See Comments)   Flushing and extreme lethargy (effect is long-lasting  and familial)   Poison Ivy Extract Rash   Sulfa Antibiotics Rash      Medication List    STOP taking these medications   Advil PM 200-38 MG Tabs Generic drug: Ibuprofen-diphenhydrAMINE Cit   rosuvastatin 10 MG tablet Commonly known as: Crestor      TAKE these medications   aspirin 325 MG EC tablet Take 1 tablet (325 mg total) by mouth daily. Start taking on: August 05, 2019   atorvastatin 40 MG tablet Commonly known as: LIPITOR Take 1 tablet (40 mg total) by mouth daily at 6 PM.   clopidogrel 75 MG tablet Commonly known as: PLAVIX Take 1 tablet (75 mg total) by mouth daily. Start taking on: August 05, 2019   mirtazapine 15 MG tablet Commonly known as: REMERON Take 15 mg by mouth at bedtime.      Follow-up Information    [facility]. Schedule an appointment as soon as possible for a visit in 4 week(s).   Specialty: Neurology Contact information: [place] [phone]         Allergies  Allergen Reactions  . Mango Flavor Nausea Only and Other (See Comments)    Flushing and extreme lethargy (effect is long-lasting and familial)  . Poison Ivy Extract Rash  . Sulfa Antibiotics Rash    Consultations:  neurology   Procedures/Studies: CT ANGIO HEAD W OR WO CONTRAST  Result Date: 07/31/2019 CLINICAL DATA:  Stroke follow-up EXAM: CT ANGIOGRAPHY HEAD AND NECK TECHNIQUE: Multidetector CT imaging of the head and neck was performed using the standard protocol during bolus administration of intravenous contrast. Multiplanar CT image reconstructions and MIPs were obtained to evaluate the vascular anatomy. Carotid stenosis measurements (when applicable) are obtained utilizing NASCET criteria, using the distal internal carotid diameter as the denominator. CONTRAST:  31mL OMNIPAQUE IOHEXOL 350 MG/ML SOLN COMPARISON:  None. FINDINGS: CTA NECK FINDINGS SKELETON: There is no bony spinal canal stenosis. No lytic or blastic lesion. OTHER NECK: Normal pharynx, larynx and major salivary glands. No cervical lymphadenopathy. Unremarkable thyroid gland. UPPER CHEST: No pneumothorax or pleural effusion. No nodules or masses. AORTIC ARCH: There is no calcific  atherosclerosis of the aortic arch. There is no aneurysm, dissection or hemodynamically significant stenosis of the visualized portion of the aorta. Conventional 3 vessel aortic branching pattern. The visualized proximal subclavian arteries are widely patent. RIGHT CAROTID SYSTEM: Normal without aneurysm, dissection or stenosis. LEFT CAROTID SYSTEM: No dissection, occlusion or aneurysm. There is mixed density atherosclerosis extending into the proximal ICA, resulting in 50% stenosis. VERTEBRAL ARTERIES: Codominant configuration. Both origins are clearly patent. There is no dissection, occlusion or flow-limiting stenosis to the skull base (V1-V3 segments). CTA HEAD FINDINGS POSTERIOR CIRCULATION: --Vertebral arteries: Normal V4 segments. --Posterior inferior cerebellar arteries (PICA): Patent origins from the vertebral arteries. --Anterior inferior cerebellar arteries (AICA): Patent origins from the basilar artery. --Basilar artery: Normal. --Superior cerebellar arteries: Normal. --Posterior cerebral arteries: Normal. Both originate from the basilar artery. Posterior communicating arteries (p-comm) are diminutive or absent. ANTERIOR CIRCULATION: --Intracranial internal carotid arteries: Normal. --Anterior cerebral arteries (ACA): Normal. Absent left A1 segment, normal variant --Middle cerebral arteries (MCA): Normal. VENOUS SINUSES: As permitted by contrast timing, patent. ANATOMIC VARIANTS: Absent left A1 segment of the anterior cerebral arteries. Review of the MIP images confirms the above findings. IMPRESSION: 1. No intracranial arterial occlusion or high-grade stenosis. 2. 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 Electronically Signed   By: Lennette Bihari  Chase Picket M.D.   On: 07/31/2019 04:58   CT Head Wo Contrast  Result Date: 07/30/2019 CLINICAL DATA:  Weakness EXAM: CT HEAD WITHOUT CONTRAST TECHNIQUE: Contiguous axial images were obtained from the base of the skull through the vertex  without intravenous contrast. COMPARISON:  CT brain 08/19/2013 FINDINGS: Brain: No acute territorial infarction, hemorrhage, or intracranial mass. Mild to moderate atrophy. Moderate hypodensity in the white matter consistent with chronic small vessel ischemic change. Chronic lacunar infarcts within the bilateral basal ganglia. Stable ventricle size Vascular: No hyperdense vessels.  Carotid vascular calcification Skull: Normal. Negative for fracture or focal lesion. Sinuses/Orbits: Mucosal thickening in the sinuses. Sclerosis and bony wall thickening of the left sphenoid sinus consistent with chronic disease. Other: None IMPRESSION: 1. No CT evidence for acute intracranial abnormality. 2. Atrophy and chronic small vessel ischemic change of the white matter Electronically Signed   By: Jasmine Pang M.D.   On: 07/30/2019 20:35   CT ANGIO NECK W OR WO CONTRAST  Result Date: 07/31/2019 CLINICAL DATA:  Stroke follow-up EXAM: CT ANGIOGRAPHY HEAD AND NECK TECHNIQUE: Multidetector CT imaging of the head and neck was performed using the standard protocol during bolus administration of intravenous contrast. Multiplanar CT image reconstructions and MIPs were obtained to evaluate the vascular anatomy. Carotid stenosis measurements (when applicable) are obtained utilizing NASCET criteria, using the distal internal carotid diameter as the denominator. CONTRAST:  75mL OMNIPAQUE IOHEXOL 350 MG/ML SOLN COMPARISON:  None. FINDINGS: CTA NECK FINDINGS SKELETON: There is no bony spinal canal stenosis. No lytic or blastic lesion. OTHER NECK: Normal pharynx, larynx and major salivary glands. No cervical lymphadenopathy. Unremarkable thyroid gland. UPPER CHEST: No pneumothorax or pleural effusion. No nodules or masses. AORTIC ARCH: There is no calcific atherosclerosis of the aortic arch. There is no aneurysm, dissection or hemodynamically significant stenosis of the visualized portion of the aorta. Conventional 3 vessel aortic branching  pattern. The visualized proximal subclavian arteries are widely patent. RIGHT CAROTID SYSTEM: Normal without aneurysm, dissection or stenosis. LEFT CAROTID SYSTEM: No dissection, occlusion or aneurysm. There is mixed density atherosclerosis extending into the proximal ICA, resulting in 50% stenosis. VERTEBRAL ARTERIES: Codominant configuration. Both origins are clearly patent. There is no dissection, occlusion or flow-limiting stenosis to the skull base (V1-V3 segments). CTA HEAD FINDINGS POSTERIOR CIRCULATION: --Vertebral arteries: Normal V4 segments. --Posterior inferior cerebellar arteries (PICA): Patent origins from the vertebral arteries. --Anterior inferior cerebellar arteries (AICA): Patent origins from the basilar artery. --Basilar artery: Normal. --Superior cerebellar arteries: Normal. --Posterior cerebral arteries: Normal. Both originate from the basilar artery. Posterior communicating arteries (p-comm) are diminutive or absent. ANTERIOR CIRCULATION: --Intracranial internal carotid arteries: Normal. --Anterior cerebral arteries (ACA): Normal. Absent left A1 segment, normal variant --Middle cerebral arteries (MCA): Normal. VENOUS SINUSES: As permitted by contrast timing, patent. ANATOMIC VARIANTS: Absent left A1 segment of the anterior cerebral arteries. Review of the MIP images confirms the above findings. IMPRESSION: 1. No intracranial arterial occlusion or high-grade stenosis. 2. 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 Electronically Signed   By: Deatra Robinson M.D.   On: 07/31/2019 04:58   MR BRAIN WO CONTRAST  Result Date: 07/30/2019 CLINICAL DATA:  Dementia and difficulty walking EXAM: MRI HEAD WITHOUT CONTRAST TECHNIQUE: Multiplanar, multiecho pulse sequences of the brain and surrounding structures were obtained without intravenous contrast. The examination had to be discontinued prior to completion due to the patient's altered mental status and inability to  cooperate with technologist's instructions. Eight series are provided for interpretation.  COMPARISON:  Brain MRI 08/19/2013 FINDINGS: Brain: Multifocal abnormal diffusion restriction within the left frontal white matter, within the anterior cerebral artery territory. Punctate focus of hyperintensity on diffusion-weighted imaging (axial) in the right cerebellar hemisphere may be artifactual. Multifocal white matter hyperintensity, most commonly due to chronic ischemic microangiopathy. There are multiple old small vessel infarcts of the basal ganglia and cerebellum. There is generalized atrophy without lobar predilection. There are multiple chronic microhemorrhages concentrated within the left parietal lobe. Vascular: Normal flow voids. Skull and upper cervical spine: Normal marrow signal. Sinuses/Orbits: Negative. Other: None. IMPRESSION: 1. Multiple small acute infarcts within the left frontal white matter, in the left anterior cerebral artery territory. 2. Punctate focus of hyperintensity on diffusion-weighted imaging in the right cerebellar hemisphere may be artifactual. 3. Generalized atrophy, without lobar predilection. 4. 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 5. Multiple chronic microhemorrhages concentrated within the left parietal lobe. Electronically Signed   By: Deatra Robinson M.D.   On: 07/30/2019 23:41   ECHOCARDIOGRAM COMPLETE  Result Date: 08/01/2019    ECHOCARDIOGRAM REPORT   Patient Name:   Yolanda Hopkins Date of Exam: 08/01/2019 Medical Rec #:  [id number]        Height:       64.0 in Accession #:    [id number]       Weight:       95.9 lb Date of Birth:  1940-06-20        BSA:          1.431 m Patient Age:    79 years         BP:           153/82 mmHg Patient Gender: F                HR:           74 bpm. Exam Location:  Inpatient Procedure: 2D Echo, Color Doppler and Cardiac Doppler Indications:    Stroke i163.9  History:        Patient has prior history of  Echocardiogram examinations, most                 recent 08/20/2013. Risk Factors:Hypertension and Dyslipidemia.  Sonographer:    Irving Burton Senior RDCS Referring Phys: [id number] ARSHAD N KAKRAKANDY IMPRESSIONS  1. Left ventricular ejection fraction, by estimation, is 55 to 60%. The left ventricle has normal function. The left ventricle has no regional wall motion abnormalities. Left ventricular diastolic parameters are consistent with Grade I diastolic dysfunction (impaired relaxation).  2. Right ventricular systolic function is normal. The right ventricular size is normal. There is normal pulmonary artery systolic pressure. The estimated right ventricular systolic pressure is 21.0 mmHg.  3. The mitral valve is grossly normal. Trivial mitral valve regurgitation.  4. The aortic valve is tricuspid. Aortic valve regurgitation is not visualized. Mild aortic valve sclerosis is present, with no evidence of aortic valve stenosis. FINDINGS  Left Ventricle: Left ventricular ejection fraction, by estimation, is 55 to 60%. The left ventricle has normal function. The left ventricle has no regional wall motion abnormalities. The left ventricular internal cavity size was normal in size. There is  borderline left ventricular hypertrophy. Left ventricular diastolic parameters are consistent with Grade I diastolic dysfunction (impaired relaxation). Right Ventricle: The right ventricular size is normal. No increase in right ventricular wall thickness. Right ventricular systolic function is normal. There is normal pulmonary artery systolic pressure. The tricuspid regurgitant velocity is 2.12 m/s,  and  with an assumed right atrial pressure of 3 mmHg, the estimated right ventricular systolic pressure is 21.0 mmHg. Left Atrium: Left atrial size was normal in size. Right Atrium: Right atrial size was normal in size. Pericardium: There is no evidence of pericardial effusion. Presence of pericardial fat pad. Mitral Valve: The mitral valve is grossly  normal. Mild mitral annular calcification. Trivial mitral valve regurgitation. Tricuspid Valve: The tricuspid valve is grossly normal. Tricuspid valve regurgitation is trivial. Aortic Valve: The aortic valve is tricuspid. Aortic valve regurgitation is not visualized. Mild aortic valve sclerosis is present, with no evidence of aortic valve stenosis. Mild aortic valve annular calcification. Pulmonic Valve: The pulmonic valve was grossly normal. Pulmonic valve regurgitation is trivial. Aorta: The aortic root is normal in size and structure. IAS/Shunts: The interatrial septum appears to be lipomatous. No atrial level shunt detected by color flow Doppler.  LEFT VENTRICLE PLAX 2D LVIDd:         3.30 cm  Diastology LVIDs:         2.40 cm  LV e' lateral:   6.20 cm/s LV PW:         0.80 cm  LV E/e' lateral: 10.2 LV IVS:        1.00 cm  LV e' medial:    5.22 cm/s LVOT diam:     1.80 cm  LV E/e' medial:  12.1 LV SV:         67 LV SV Index:   47 LVOT Area:     2.54 cm  RIGHT VENTRICLE RV S prime:     13.80 cm/s TAPSE (M-mode): 1.6 cm LEFT ATRIUM             Index       RIGHT ATRIUM           Index LA diam:        2.30 cm 1.61 cm/m  RA Area:     16.20 cm LA Vol (A2C):   51.6 ml 36.05 ml/m RA Volume:   47.20 ml  32.97 ml/m LA Vol (A4C):   31.9 ml 22.28 ml/m LA Biplane Vol: 42.1 ml 29.41 ml/m  AORTIC VALVE LVOT Vmax:   119.00 cm/s LVOT Vmean:  81.600 cm/s LVOT VTI:    0.263 m  AORTA Ao Root diam: 3.00 cm MITRAL VALVE               TRICUSPID VALVE MV Area (PHT): 2.26 cm    TR Peak grad:   18.0 mmHg MV Decel Time: 335 msec    TR Vmax:        212.00 cm/s MV E velocity: 63.00 cm/s MV A velocity: 86.10 cm/s  SHUNTS MV E/A ratio:  0.73        Systemic VTI:  0.26 m                            Systemic Diam: 1.80 cm Nona Dell MD Electronically signed by Nona Dell MD Signature Date/Time: 08/01/2019/11:40:08 AM    Final    DG Hip Unilat W or Wo Pelvis 2-3 Views Right  Result Date: 07/30/2019 CLINICAL DATA:  Fall, leg  injury EXAM: DG HIP (WITH OR WITHOUT PELVIS) 2-3V RIGHT COMPARISON:  None. FINDINGS: The osseous structures appear diffusely demineralized which may limit detection of small or nondisplaced fractures. Bones of the pelvis appear intact and congruent. A right hip hemiarthroplasty appears normally aligned without evidence of periprosthetic fracture,  hardware failure or lucency. Mild right hip swelling. Left femoral head is normally located. No proximal left femoral fracture is seen. Vascular calcium noted in the pelvis. Multilevel degenerative changes are present in the imaged portions of the spine. Additional degenerative changes in the SI joints and symphysis pubis IMPRESSION: Mild right hip soft tissue swelling. No acute osseous abnormality or evidence acute prosthetic complication of the patient's right hip hemiarthroplasty. Electronically Signed   By: Kreg ShropshirePrice  DeHay M.D.   On: 07/30/2019 20:51      Subjective: No new events  Discharge Exam: Vitals:   08/04/19 0343 08/04/19 0839  BP: (!) 103/54 (!) 152/84  Pulse: 71 82  Resp: 14 16  Temp: 98.1 F (36.7 C) 98.3 F (36.8 C)  SpO2: 98% 96%   Vitals:   08/03/19 2025 08/03/19 2328 08/04/19 0343 08/04/19 0839  BP: 137/72 137/77 (!) 103/54 (!) 152/84  Pulse: 80 84 71 82  Resp: 16 17 14 16   Temp: 98.5 F (36.9 C) (!) 97.5 F (36.4 C) 98.1 F (36.7 C) 98.3 F (36.8 C)  TempSrc: Oral Oral Oral Oral  SpO2: 95% 98% 98% 96%    General: Pt is alert, awake, not in acute distress Cardiovascular: RRR, S1/S2 +, no rubs, no gallops Respiratory: CTA bilaterally, no wheezing, no rhonchi Abdominal: Soft, NT, ND, bowel sounds + Extremities: no edema, no cyanosis    The results of significant diagnostics from this hospitalization (including imaging, microbiology, ancillary and laboratory) are listed below for reference.     Microbiology: Recent Results (from the past 240 hour(s))  Urine culture     Status: Abnormal   Collection Time: 07/30/19  11:03 PM   Specimen: Urine, Random  Result Value Ref Range Status   Specimen Description URINE, RANDOM  Final   Special Requests   Final    NONE Performed at [facility], [address] [place], [place] [address]    Culture <10,000 COLONIES/mL INSIGNIFICANT GROWTH (A)  Final   Report Status 08/01/2019 FINAL  Final  SARS CORONAVIRUS 2 (TAT 6-24 HRS) Nasopharyngeal Nasopharyngeal Swab     Status: None   Collection Time: 07/30/19 11:34 PM   Specimen: Nasopharyngeal Swab  Result Value Ref Range Status   SARS Coronavirus 2 NEGATIVE NEGATIVE Final    Comment: (NOTE) SARS-CoV-2 target nucleic acids are NOT DETECTED. The SARS-CoV-2 RNA is generally detectable in upper and lower respiratory specimens during the acute phase of infection. Negative results do not preclude SARS-CoV-2 infection, do not rule out co-infections with other pathogens, and should not be used as the sole basis for treatment or other patient management decisions. Negative results must be combined with clinical observations, patient history, and epidemiological information. The expected result is Negative. Fact Sheet for Patients: HairSlick.nohttps://www.fda.gov/media/138098/download Fact Sheet for Healthcare Providers: quierodirigir.comhttps://www.fda.gov/media/138095/download This test is not yet approved or cleared by the [place] FDA and  has been authorized for detection and/or diagnosis of SARS-CoV-2 by FDA under an Emergency Use Authorization (EUA). This EUA will remain  in effect (meaning this test can be used) for the duration of the COVID-19 declaration under Section 56 4(b)(1) of the Act, 21 U.S.C. section 360bbb-3(b)(1), unless the authorization is terminated or revoked sooner. Performed at [facility], [address] [place], [place] [address]      Labs: BNP (last 3 results) No results for input(s): BNP in the last 8760 hours. Basic Metabolic Panel: Recent Labs  Lab 07/30/19 1734 07/31/19 0015  08/04/19 0500 08/04/19 1432  NA 140  --  139 138  K 4.0  --  5.7* 4.4  CL 101  --  107 103  CO2 26  --  24 26  GLUCOSE 97  --  97 111*  BUN 24*  --  24* 26*  CREATININE 0.86 0.61 0.71 0.87  CALCIUM 10.1  --  9.4 9.5   Liver Function Tests: No results for input(s): AST, ALT, ALKPHOS, BILITOT, PROT, ALBUMIN in the last 168 hours. No results for input(s): LIPASE, AMYLASE in the last 168 hours. No results for input(s): AMMONIA in the last 168 hours. CBC: Recent Labs  Lab 07/30/19 1734 07/31/19 0015  WBC 5.9 4.9  HGB 14.2 14.5  HCT 43.7 44.4  MCV 98.9 97.8  PLT 325 277   Cardiac Enzymes: No results for input(s): CKTOTAL, CKMB, CKMBINDEX, TROPONINI in the last 168 hours. BNP: Invalid input(s): POCBNP CBG: Recent Labs  Lab 08/03/19 0606 08/04/19 1124  GLUCAP 93 101*   D-Dimer No results for input(s): DDIMER in the last 72 hours. Hgb A1c No results for input(s): HGBA1C in the last 72 hours. Lipid Profile No results for input(s): CHOL, HDL, LDLCALC, TRIG, CHOLHDL, LDLDIRECT in the last 72 hours. Thyroid function studies No results for input(s): TSH, T4TOTAL, T3FREE, THYROIDAB in the last 72 hours.  Invalid input(s): FREET3 Anemia work up No results for input(s): VITAMINB12, FOLATE, FERRITIN, TIBC, IRON, RETICCTPCT in the last 72 hours. Urinalysis    Component Value Date/Time   COLORURINE STRAW (A) 07/30/2019 2204   APPEARANCEUR CLEAR 07/30/2019 2204   LABSPEC 1.008 07/30/2019 2204   PHURINE 6.0 07/30/2019 2204   GLUCOSEU NEGATIVE 07/30/2019 2204   HGBUR NEGATIVE 07/30/2019 2204   BILIRUBINUR NEGATIVE 07/30/2019 2204   KETONESUR NEGATIVE 07/30/2019 2204   PROTEINUR NEGATIVE 07/30/2019 2204   UROBILINOGEN 0.2 08/19/2013 1055   NITRITE NEGATIVE 07/30/2019 2204   LEUKOCYTESUR MODERATE (A) 07/30/2019 2204   Sepsis Labs Invalid input(s): PROCALCITONIN,  WBC,  LACTICIDVEN Microbiology Recent Results (from the past 240 hour(s))  Urine culture     Status: Abnormal    Collection Time: 07/30/19 11:03 PM   Specimen: Urine, Random  Result Value Ref Range Status   Specimen Description URINE, RANDOM  Final   Special Requests   Final    NONE Performed at [facility], [address], [place], [place] [address]    Culture <10,000 COLONIES/mL INSIGNIFICANT GROWTH (A)  Final   Report Status 08/01/2019 FINAL  Final  SARS CORONAVIRUS 2 (TAT 6-24 HRS) Nasopharyngeal Nasopharyngeal Swab     Status: None   Collection Time: 07/30/19 11:34 PM   Specimen: Nasopharyngeal Swab  Result Value Ref Range Status   SARS Coronavirus 2 NEGATIVE NEGATIVE Final    Comment: (NOTE) SARS-CoV-2 target nucleic acids are NOT DETECTED. The SARS-CoV-2 RNA is generally detectable in upper and lower respiratory specimens during the acute phase of infection. Negative results do not preclude SARS-CoV-2 infection, do not rule out co-infections with other pathogens, and should not be used as the sole basis for treatment or other patient management decisions. Negative results must be combined with clinical observations, patient history, and epidemiological information. The expected result is Negative. Fact Sheet for Patients: HairSlick.no Fact Sheet for Healthcare Providers: quierodirigir.com This test is not yet approved or cleared by the [place] FDA and  has been authorized for detection and/or diagnosis of SARS-CoV-2 by FDA under an Emergency Use Authorization (EUA). This EUA will remain  in effect (meaning this test can be  used) for the duration of the COVID-19 declaration under Section 56 4(b)(1) of the Act, 21 U.S.C. section 360bbb-3(b)(1), unless the authorization is terminated or revoked sooner. Performed at [facility], [address] [place], [place] [address]      Time coordinating discharge:32 minutes  SIGNED:   Kathlen Mody, MD  [facility] 08/04/2019, 4:55 PM

## 2019-08-04 NOTE — Progress Notes (Signed)
Physical Therapy Treatment Patient Details Name: Yolanda Hopkins MRN: [id number] DOB: 02/08/1941 Today's Date: 08/04/2019    History of Present Illness Pt is an 79 y.o. female admitted 07/30/19 after son noted pt dragging RLE was confused and had two falls. 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 Possible UTI. R hip imaging without fx. PMH includes recent R hip fx (04/2019), HTN, vertigo, depression.    PT Comments    Pt was recieved supine in bed with HOB elevated. Session focused on coordination with weight shift and stepping including standing, reaching, and stair activities. Initially required physical cues and moderate assist to accept weight on the left side for right stepping. Progressed to only verbal cues and min assist. Slow and steady progress with coordination, standing balance, and gait. Overall progressing well; Anticipate continuing good progress at post-acute rehabilitation.    Follow Up Recommendations  CIR;Supervision/Assistance - 24 hour     Equipment Recommendations  Rolling walker with 5" wheels    Recommendations for Other Services       Precautions / Restrictions Precautions Precautions: Fall    Mobility  Bed Mobility Overal bed mobility: Needs Assistance Bed Mobility: Supine to Sit     Supine to sit: Mod assist     General bed mobility comments: Pt initiating movement; verbal cue to reach across body to grab bar; was able to bring LEs to side of bed but required assistance to bring LEs off of bed and lift trunk to seated position at EOB; required verbal cues for hand placement on bed for supported sitting and to move closer to EOB to reach feet to ground; cues for task follow-through to completion  Transfers Overall transfer level: Needs assistance Equipment used: (Bil UE support at counter and rails on stair mockup) Transfers: Sit to/from Stand Sit to Stand: Mod assist         General transfer comment: Multimodal cues to initiate sit to stand; Mod assist to fullly rise to standing, and cues for anterior weght shift  Ambulation/Gait                 Stairs             Wheelchair Mobility    Modified Rankin (Stroke Patients Only) Modified Rankin (Stroke Patients Only) Pre-Morbid Rankin Score: Slight disability Modified Rankin: Moderately severe disability     Balance             Standing balance-Leahy Scale: Poor(Approaching Fair) Standing balance comment: Participated in dynamic stepping and standing activities including toe touches on step, and standing reaching activities                            Cognition Arousal/Alertness: Awake/alert Behavior During Therapy: Flat affect Overall Cognitive Status: Impaired/Different from baseline Area of Impairment: Following commands;Safety/judgement;Problem solving                               General Comments: Was able to respond to verbal cues and answer yes/no questions; answered closed-ended questions about 75% of the times; alternated attention between individuals in the room      Exercises      General Comments        Pertinent Vitals/Pain Pain Assessment: Faces Faces Pain Scale: Hurts little more Pain Location: "all over" Pain Descriptors / Indicators: Grimacing  Pain Intervention(s): Monitored during session    Home Living                      Prior Function            PT Goals (current goals can now be found in the care plan section) Acute Rehab PT Goals Patient Stated Goal: None stated PT Goal Formulation: With family Time For Goal Achievement: 08/14/19 Potential to Achieve Goals: Fair Progress towards PT goals: Progressing toward goals    Frequency    Min 4X/week      PT Plan Current plan remains appropriate    Co-evaluation              AM-PAC PT "6 Clicks" Mobility   Outcome Measure  Help needed  turning from your back to your side while in a flat bed without using bedrails?: A Little Help needed moving from lying on your back to sitting on the side of a flat bed without using bedrails?: A Lot Help needed moving to and from a bed to a chair (including a wheelchair)?: A Lot Help needed standing up from a chair using your arms (e.g., wheelchair or bedside chair)?: A Little Help needed to walk in hospital room?: A Lot Help needed climbing 3-5 steps with a railing? : A Little 6 Click Score: 15    End of Session Equipment Utilized During Treatment: Gait belt Activity Tolerance: Patient tolerated treatment well Patient left: in chair;with call bell/phone within reach;with chair alarm set;Other (comment);with family/visitor present Nurse Communication: Mobility status PT Visit Diagnosis: Unsteadiness on feet (R26.81);Other abnormalities of gait and mobility (R26.89);History of falling (Z91.81);Muscle weakness (generalized) (M62.81);Other symptoms and signs involving the nervous system (R29.898)     Time: 1545-1630 PT Time Calculation (min) (ACUTE ONLY): 45 min  Charges:  $Therapeutic Activity: 23-37 mins $Neuromuscular Re-education: 8-22 mins                     Roney Marion, PT  Acute Rehabilitation Services Pager [phone] Office [place] 08/04/2019, 6:07 PM

## 2019-08-05 ENCOUNTER — Inpatient Hospital Stay (HOSPITAL_COMMUNITY)
Admission: RE | Admit: 2019-08-05 | Discharge: 2019-08-20 | DRG: 057 | Disposition: A | Discharge status: Home Health | Payer: Medicare Other | Source: Intra-hospital | Attending: Physical Medicine & Rehabilitation | Admitting: Physical Medicine & Rehabilitation

## 2019-08-05 ENCOUNTER — Other Ambulatory Visit: Payer: Self-pay

## 2019-08-05 ENCOUNTER — Encounter (HOSPITAL_COMMUNITY): Payer: Self-pay | Admitting: Physical Medicine & Rehabilitation

## 2019-08-05 DIAGNOSIS — F0150 Vascular dementia without behavioral disturbance: Secondary | ICD-10-CM | POA: Diagnosis present

## 2019-08-05 DIAGNOSIS — R350 Frequency of micturition: Secondary | ICD-10-CM

## 2019-08-05 DIAGNOSIS — Z882 Allergy status to sulfonamides status: Secondary | ICD-10-CM

## 2019-08-05 DIAGNOSIS — I69351 Hemiplegia and hemiparesis following cerebral infarction affecting right dominant side: Principal | ICD-10-CM

## 2019-08-05 DIAGNOSIS — I6522 Occlusion and stenosis of left carotid artery: Secondary | ICD-10-CM | POA: Diagnosis present

## 2019-08-05 DIAGNOSIS — Z9102 Food additives allergy status: Secondary | ICD-10-CM | POA: Diagnosis not present

## 2019-08-05 DIAGNOSIS — R413 Other amnesia: Secondary | ICD-10-CM | POA: Diagnosis present

## 2019-08-05 DIAGNOSIS — Z8249 Family history of ischemic heart disease and other diseases of the circulatory system: Secondary | ICD-10-CM | POA: Diagnosis not present

## 2019-08-05 DIAGNOSIS — N3942 Incontinence without sensory awareness: Secondary | ICD-10-CM | POA: Diagnosis not present

## 2019-08-05 DIAGNOSIS — K5901 Slow transit constipation: Secondary | ICD-10-CM | POA: Diagnosis present

## 2019-08-05 DIAGNOSIS — E785 Hyperlipidemia, unspecified: Secondary | ICD-10-CM | POA: Diagnosis present

## 2019-08-05 DIAGNOSIS — I69311 Memory deficit following cerebral infarction: Secondary | ICD-10-CM | POA: Diagnosis not present

## 2019-08-05 DIAGNOSIS — R159 Full incontinence of feces: Secondary | ICD-10-CM | POA: Diagnosis present

## 2019-08-05 DIAGNOSIS — I63522 Cerebral infarction due to unspecified occlusion or stenosis of left anterior cerebral artery: Secondary | ICD-10-CM

## 2019-08-05 DIAGNOSIS — Z800 Family history of malignant neoplasm of digestive organs: Secondary | ICD-10-CM

## 2019-08-05 DIAGNOSIS — F17210 Nicotine dependence, cigarettes, uncomplicated: Secondary | ICD-10-CM | POA: Diagnosis present

## 2019-08-05 DIAGNOSIS — Z8679 Personal history of other diseases of the circulatory system: Secondary | ICD-10-CM | POA: Diagnosis not present

## 2019-08-05 DIAGNOSIS — I10 Essential (primary) hypertension: Secondary | ICD-10-CM | POA: Diagnosis present

## 2019-08-05 DIAGNOSIS — G20 Parkinson's disease: Secondary | ICD-10-CM | POA: Diagnosis present

## 2019-08-05 DIAGNOSIS — R32 Unspecified urinary incontinence: Secondary | ICD-10-CM | POA: Diagnosis present

## 2019-08-05 DIAGNOSIS — F329 Major depressive disorder, single episode, unspecified: Secondary | ICD-10-CM | POA: Diagnosis present

## 2019-08-05 DIAGNOSIS — Z7982 Long term (current) use of aspirin: Secondary | ICD-10-CM | POA: Diagnosis not present

## 2019-08-05 DIAGNOSIS — Z79899 Other long term (current) drug therapy: Secondary | ICD-10-CM | POA: Diagnosis not present

## 2019-08-05 DIAGNOSIS — Z7902 Long term (current) use of antithrombotics/antiplatelets: Secondary | ICD-10-CM

## 2019-08-05 DIAGNOSIS — R150 Incomplete defecation: Secondary | ICD-10-CM

## 2019-08-05 LAB — CREATININE, SERUM
Creatinine, Ser: 1 mg/dL (ref 0.44–1.00)
GFR calc Af Amer: >60 mL/min (ref >60)
GFR calc non Af Amer: 53 mL/min — ABNORMAL LOW (ref >60)

## 2019-08-05 LAB — CBC
HCT: 44.8 % (ref 36.0–46.0)
Hemoglobin: 15.1 g/dL — ABNORMAL HIGH (ref 12.0–15.0)
MCH: 32.1 pg (ref 26.0–34.0)
MCHC: 33.7 g/dL (ref 30.0–36.0)
MCV: 95.3 fL (ref 80.0–100.0)
Platelets: 303 10*3/uL (ref 150–400)
RBC: 4.7 MIL/uL (ref 3.87–5.11)
RDW: 12.7 % (ref 11.5–15.5)
WBC: 6.2 10*3/uL (ref 4.0–10.5)
nRBC: 0 % (ref 0.0–0.2)

## 2019-08-05 MED ORDER — ACETAMINOPHEN 325 MG PO TABS: 650.0000 mg | ORAL_TABLET | ORAL | Status: DC | PRN

## 2019-08-05 MED ORDER — ENOXAPARIN SODIUM 40 MG/0.4ML ~~LOC~~ SOLN: 40.0000 mg | SUBCUTANEOUS | Status: DC

## 2019-08-05 MED ORDER — CLOPIDOGREL BISULFATE 75 MG PO TABS
75.0000 mg | ORAL_TABLET | Freq: Every day | ORAL | Status: DC
  Administered 2019-08-06 – 2019-08-20 (×15): 75 mg via ORAL
  Filled 2019-08-05 (×15): qty 1

## 2019-08-05 MED ORDER — NICOTINE 7 MG/24HR TD PT24
7.0000 mg | MEDICATED_PATCH | Freq: Every day | TRANSDERMAL | Status: DC
  Administered 2019-08-06 – 2019-08-20 (×15): 7 mg via TRANSDERMAL
  Filled 2019-08-05 (×15): qty 1

## 2019-08-05 MED ORDER — MIRTAZAPINE 15 MG PO TABS
7.5000 mg | ORAL_TABLET | Freq: Every day | ORAL | Status: DC
  Administered 2019-08-05 – 2019-08-08 (×4): 7.5 mg via ORAL
  Filled 2019-08-05 (×4): qty 1

## 2019-08-05 MED ORDER — ACETAMINOPHEN 160 MG/5ML PO SOLN: 650.0000 mg | ORAL | Status: DC | PRN

## 2019-08-05 MED ORDER — SORBITOL 70 % SOLN: 30.0000 mL | Freq: Every day | Status: DC | PRN

## 2019-08-05 MED ORDER — ASPIRIN EC 325 MG PO TBEC
325.0000 mg | DELAYED_RELEASE_TABLET | Freq: Every day | ORAL | Status: DC
  Administered 2019-08-06 – 2019-08-20 (×15): 325 mg via ORAL
  Filled 2019-08-05 (×15): qty 1

## 2019-08-05 MED ORDER — ENOXAPARIN SODIUM 40 MG/0.4ML ~~LOC~~ SOLN
40.0000 mg | SUBCUTANEOUS | Status: DC
  Administered 2019-08-05 – 2019-08-19 (×15): 40 mg via SUBCUTANEOUS
  Filled 2019-08-05 (×15): qty 0.4

## 2019-08-05 MED ORDER — POLYETHYLENE GLYCOL 3350 17 G PO PACK
17.0000 g | PACK | Freq: Two times a day (BID) | ORAL | Status: DC
  Administered 2019-08-05 – 2019-08-20 (×27): 17 g via ORAL
  Filled 2019-08-05 (×26): qty 1

## 2019-08-05 MED ORDER — ACETAMINOPHEN 650 MG RE SUPP: 650.0000 mg | RECTAL | Status: DC | PRN

## 2019-08-05 MED ORDER — ATORVASTATIN CALCIUM 40 MG PO TABS
40.0000 mg | ORAL_TABLET | Freq: Every day | ORAL | Status: DC
  Administered 2019-08-05 – 2019-08-19 (×15): 40 mg via ORAL
  Filled 2019-08-05 (×15): qty 1

## 2019-08-05 MED ORDER — BISACODYL 5 MG PO TBEC: 5.0000 mg | DELAYED_RELEASE_TABLET | Freq: Every day | ORAL | Status: DC | PRN

## 2019-08-05 NOTE — Progress Notes (Signed)
Physical Therapy Treatment Patient Details Name: Yolanda Hopkins MRN: [id number] DOB: 07-08-1940 Today's Date: 08/05/2019    History of Present Illness Pt is an 79 y.o. female admitted 07/30/19 after son noted pt dragging RLE was confused and had two falls. 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 Possible UTI. R hip imaging without fx. PMH includes recent R hip fx (04/2019), HTN, vertigo, depression.    PT Comments    Pt in bed upon arrival of PT/OT, agreeable to session with focus on progressing standing balance and wt shift. However, upon entering room, we discovered the pt was soiled and therefore spent the majority of the session focused on transfers and standing balance to allow for pericare. The pt was able to demo transfers, but continues to benefit from min/modA of 2 depending on height of surface to come to standing position, and continues to have difficulty with wt shift to R and advancement of RLE. With static stance, the pt was able to progress from modA to maintain upright to minG for safety within the session, but will continue to benefit to reduce initial posterior lean. The pt will continue to benefit from skilled PT to progress independence with transfers and static/dynamic stability.   Follow Up Recommendations  CIR;Supervision/Assistance - 24 hour     Equipment Recommendations  Rolling walker with 5" wheels    Recommendations for Other Services       Precautions / Restrictions Precautions Precautions: Fall Precaution Comments: cognitive deficits Restrictions Weight Bearing Restrictions: No    Mobility  Bed Mobility Overal bed mobility: Needs Assistance Bed Mobility: Supine to Sit     Supine to sit: Mod assist     General bed mobility comments: pt initiates movement, needs modA to complete movement of BLE and some assist with trunk elevation. minA once sitting to maintain upright  and min/modA to scoot to EOB  Transfers Overall transfer level: Needs assistance Equipment used: Rolling walker (2 wheeled) Transfers: Sit to/from [facility] Sit to Stand: Mod assist;+2 physical assistance Stand pivot transfers: Mod assist;+2 physical assistance       General transfer comment: initially minA of 2 to stand from bed, modA of 2 to stand from [facility] due to strong posterior lean and pushing with all 4 extremities. modA to move RW and maintain upright with stand pivot to [facility]  Ambulation/Gait Ambulation/Gait assistance: Mod assist;+2 safety/equipment Gait Distance (Feet): 5 Feet Assistive device: Rolling walker (2 wheeled) Gait Pattern/deviations: Decreased step length - right;Step-to pattern;Decreased stride length;Decreased weight shift to left;Shuffle;Trunk flexed;Wide base of support Gait velocity: Decreased Gait velocity interpretation: <1.31 ft/sec, indicative of household ambulator General Gait Details: Slow, unsteady steps with HHA and modA to maintain balance; shuffling gait with RLE lagging behind, difficulty taking complete step to progress RLE forwards or backwards despite max, multimodal cues   Stairs             Wheelchair Mobility    Modified Rankin (Stroke Patients Only) Modified Rankin (Stroke Patients Only) Pre-Morbid Rankin Score: Slight disability Modified Rankin: Moderately severe disability     Balance Overall balance assessment: Needs assistance;History of Falls Sitting-balance support: Bilateral upper extremity supported Sitting balance-Leahy Scale: Poor Sitting balance - Comments: strong R lateral lean despite multiple cues, minA. Postural control: Right lateral lean;Posterior lean Standing balance support: Bilateral upper extremity supported Standing balance-Leahy Scale: Poor Standing balance comment: poterior lean intially, progressed from modA of 1 to minG  with continued standing for pericare.                             Cognition Arousal/Alertness: Awake/alert Behavior During Therapy: Flat affect Overall Cognitive Status: Impaired/Different from baseline Area of Impairment: Following commands;Safety/judgement;Problem solving                       Following Commands: Follows one step commands inconsistently;Follows one step commands with increased time Safety/Judgement: Decreased awareness of safety;Decreased awareness of deficits   Problem Solving: Slow processing;Decreased initiation;Requires verbal cues;Requires tactile cues;Difficulty sequencing General Comments: Was able to respond to verbal cues and answer yes/no questions; answered closed-ended questions about 75% of the times; alternated attention between individuals in the room      Exercises      General Comments        Pertinent Vitals/Pain Pain Assessment: Faces Faces Pain Scale: Hurts little more Pain Location: buttocks/genitals Pain Descriptors / Indicators: Grimacing Pain Intervention(s): Limited activity within patient's tolerance;Monitored during session    Home Living                      Prior Function            PT Goals (current goals can now be found in the care plan section) Acute Rehab PT Goals Patient Stated Goal: None stated PT Goal Formulation: With family Time For Goal Achievement: 08/14/19 Potential to Achieve Goals: Fair Progress towards PT goals: Progressing toward goals    Frequency    Min 4X/week      PT Plan Current plan remains appropriate    Co-evaluation PT/OT/SLP Co-Evaluation/Treatment: Yes Reason for Co-Treatment: Complexity of the patient's impairments (multi-system involvement);Necessary to address cognition/behavior during functional activity;To address functional/ADL transfers;For patient/therapist safety PT goals addressed during session: Mobility/safety with mobility;Balance;Strengthening/ROM        AM-PAC PT "6 Clicks" Mobility   Outcome  Measure  Help needed turning from your back to your side while in a flat bed without using bedrails?: A Little Help needed moving from lying on your back to sitting on the side of a flat bed without using bedrails?: A Lot Help needed moving to and from a bed to a chair (including a wheelchair)?: A Lot Help needed standing up from a chair using your arms (e.g., wheelchair or bedside chair)?: A Little Help needed to walk in hospital room?: A Lot Help needed climbing 3-5 steps with a railing? : A Lot 6 Click Score: 14    End of Session Equipment Utilized During Treatment: Gait belt Activity Tolerance: Patient tolerated treatment well Patient left: with call bell/phone within reach;with chair alarm set;in chair Nurse Communication: Mobility status(need for laxitive) PT Visit Diagnosis: Unsteadiness on feet (R26.81);Other abnormalities of gait and mobility (R26.89);History of falling (Z91.81);Muscle weakness (generalized) (M62.81);Other symptoms and signs involving the nervous system (Q94.765)     Time: 4650-3546 PT Time Calculation (min) (ACUTE ONLY): 33 min  Charges:  $Gait Training: 8-22 mins                     Rolm Baptise, PT, DPT   Acute Rehabilitation Department Pager #: [phone]   Gaetana Michaelis 08/05/2019, 11:12 AM

## 2019-08-05 NOTE — PMR Pre-admission (Signed)
PMR Admission Coordinator Pre-Admission Assessment  Patient: Yolanda Hopkins is an 79 y.o., female MRN: [id number] DOB: 08/04/1940 Height:   Weight:                Insurance Information HMO:     PPO:      PCP:      IPA:      80/20: yes     OTHER:  PRIMARY: Medicare part A and B      Policy#: [id number]      Subscriber: patient CM Name:       Phone#:      Fax#:  Pre-Cert#:       Employer:  Benefits:  Phone #: online     Name: verified eligibility online via [place] on 08/05/19 Eff. Date: Part A and B effective 05/13/2004     Deduct: $1,484      Out of Pocket Max: NA      Life Max: NA CIR: Covered per Medicare guidelines once yearly deductible has been met      SNF: days 1-20, 100%, days 21-100, 80% Outpatient: 80%     Co-Pay: 20% Home Health: 100%      Co-Pay:  DME: 80%     Co-Pay: 20% Providers: Pt's choice SECONDARY: [employer]      Policy#: [id number]      Subscriber: patient CM Name:       Phone#:      Fax#:  Pre-Cert#:       Employer:  Benefits:  Phone #: [phone]     Name:  Eff. Date:      Deduct:       Out of Pocket Max:       Life Max:  CIR:       SNF: Outpatient:      Co-Pay: Home Health:       Co-Pay: DME:     Co-Pay:   Medicaid Application Date:       Case Manager:  Disability Application Date:      Case Worker:   The "Data Collection Information Summary" for patients in Inpatient Rehabilitation Facilities with attached "Privacy Act [place] Records" was provided and verbally reviewed with: Family  Emergency Contact Information Contact Information    Name Relation Home Work [place], [place] Son   [phone]   Yolanda Hopkins Daughter   [phone]   Yolanda Hopkins, Yolanda Hopkins Daughter   [phone]   Yolanda Hopkins Other   [phone]     Current Medical History  Patient Admitting Diagnosis: Left ACA scattered infarcts  History of Present Illness: Yolanda Hopkins is an 79 year old female with history of hypertension, depression, hyperlipidemia  tobacco abuse and vertigo.  Presented 07/31/2019 with right side weakness as well as falls x2 with altered mental status.  Cranial CT scan negative for acute changes.  Patient did not receive TPA.  MRI of the brain showed multiple small acute infarcts within the left frontal white matter, and the left anterior cerebral artery territory.  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  CT angiogram of head and neck no intracranial arterial occlusion or high-grade stenosis.  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  Echocardiogram with ejection fraction of 60%.  Admission chemistries unremarkable, SARS coronavirus negative.  Neurology follow-up maintained on aspirin 325 mg and Plavix for CVA prophylaxis x3 months then aspirin alone due to severe left ACA stenosis.  Subcutaneous Lovenox for DVT prophylaxis.  Therapy evaluations  completed and patient is to be admitted for a comprehensive rehab program on 08/05/19.  Complete NIHSS TOTAL: 6 Glasgow Coma Scale Score: 15  Past Medical History  Past Medical History:  Diagnosis Date  . Hypertension   . Tobacco abuse 08/19/2013  . Vertigo     Family History  family history includes Hypertension in her father and mother; Liver cancer in her father.  Prior Rehab/Hospitalizations:  Has the patient had prior rehab or hospitalizations prior to admission? Yes  Has the patient had major surgery during 100 days prior to admission? No  Current Medications   Current Facility-Administered Medications:  .  acetaminophen (TYLENOL) tablet 650 mg, 650 mg, Oral, Q4H PRN **OR** acetaminophen (TYLENOL) 160 MG/5ML solution 650 mg, 650 mg, Per Tube, Q4H PRN **OR** acetaminophen (TYLENOL) suppository 650 mg, 650 mg, Rectal, Q4H PRN, Rise Patience, MD .  aspirin EC tablet 325 mg, 325 mg, Oral, Daily, Rosalin Hawking, MD, 325 mg at 08/05/19 0910 .  atorvastatin (LIPITOR) tablet 40 mg, 40 mg, Oral, q1800, Aroor,  Lanice Schwab, MD, 40 mg at 08/04/19 1740 .  bisacodyl (DULCOLAX) EC tablet 5 mg, 5 mg, Oral, Daily PRN, Swayze, Ava, DO, 5 mg at 08/05/19 1051 .  cefTRIAXone (ROCEPHIN) 1 g in sodium chloride 0.9 % 100 mL IVPB, 1 g, Intravenous, Q24H, Rise Patience, MD, Last Rate: 200 mL/hr at 08/05/19 0643, 1 g at 08/05/19 0643 .  clopidogrel (PLAVIX) tablet 75 mg, 75 mg, Oral, Daily, Aroor, Lanice Schwab, MD, 75 mg at 08/05/19 0910 .  enoxaparin (LOVENOX) injection 40 mg, 40 mg, Subcutaneous, Q24H, Rise Patience, MD, 40 mg at 08/04/19 2234 .  mirtazapine (REMERON) tablet 7.5 mg, 7.5 mg, Oral, QHS, Swayze, Ava, DO, 7.5 mg at 08/04/19 2232 .  nicotine (NICODERM CQ - dosed in mg/24 hr) patch 7 mg, 7 mg, Transdermal, Daily, Aroor, Karena Addison R, MD, 7 mg at 08/05/19 0910 .  polyethylene glycol (MIRALAX / GLYCOLAX) packet 17 g, 17 g, Oral, BID, Lang Snow, FNP, 17 g at 08/05/19 0911 .  sodium chloride flush (NS) 0.9 % injection 10-40 mL, 10-40 mL, Intracatheter, Q12H, Swayze, Ava, DO, 10 mL at 08/05/19 0911 .  sodium chloride flush (NS) 0.9 % injection 10-40 mL, 10-40 mL, Intracatheter, PRN, Swayze, Ava, DO .  sodium phosphate (FLEET) 7-19 GM/118ML enema 1 enema, 1 enema, Rectal, Daily PRN, Lang Snow, FNP  Patients Current Diet:  Diet Order            Diet - low sodium heart healthy        Diet Heart Room service appropriate? No; Fluid consistency: Thin  Diet effective now              Precautions / Restrictions Precautions Precautions: Fall Precaution Comments: cognitive deficits Restrictions Weight Bearing Restrictions: No   Has the patient had 2 or more falls or a fall with injury in the past year?Yes  Prior Activity Level Limited Community (1-2x/wk): not driving or working but would get out some. typically no AD use, but sometimes would use RW  Prior Functional Level Prior Function Level of Independence: Needs assistance Comments: Per chart, pt was walking with walker, 2x falls  leading to admission. Pt poor historian; attempted to call son who did not answer  Self Care: Did the patient need help bathing, dressing, using the toilet or eating?  Independent  Indoor Mobility: Did the patient need assistance with walking from room to room (with or without device)? Independent  Stairs: Did  the patient need assistance with internal or external stairs (with or without device)? Needed some help  Functional Cognition: Did the patient need help planning regular tasks such as shopping or remembering to take medications? Needed some help  Home Assistive Devices / Equipment    Prior Device Use: Indicate devices/aids used by the patient prior to current illness, exacerbation or injury? mostly no AD, did use RW at times  Current Functional Level Cognition  Arousal/Alertness: Awake/alert Overall Cognitive Status: Impaired/Different from baseline Difficult to assess due to: Impaired communication Current Attention Level: Alternating Orientation Level: Oriented to person, Disoriented to place, Disoriented to time, Disoriented to situation Following Commands: Follows one step commands inconsistently, Follows one step commands with increased time Safety/Judgement: Decreased awareness of safety, Decreased awareness of deficits General Comments: Was able to respond to verbal cues and answer yes/no questions; answered closed-ended questions about 75% of the times; alternated attention between individuals in the room Comments: Cognition not formally evaluated secondary to language deficits  [employer] Scales of Cognitive Functioning: Automatic/appropriate    Extremity Assessment (includes Sensation/Coordination)  Upper Extremity Assessment: Generalized weakness, RUE deficits/detail, LUE deficits/detail RUE Deficits / Details: R sided weakness 3-/5 grossly  LUE Deficits / Details: 3+/5 MM grade  Lower Extremity Assessment: Generalized weakness, Defer to PT evaluation RLE  Deficits / Details: Functionally <3/5 throughout; pt not following commands for formal strength testing, would wiggle toes LLE Deficits / Details: Functional </= 3/5; pt not following commands for formal strength testing    ADLs  Overall ADL's : Needs assistance/impaired Eating/Feeding: Set up, Sitting Eating/Feeding Details (indicate cue type and reason): Increased time after set-upA Grooming: Minimal assistance, Standing Upper Body Bathing: Minimal assistance, Standing Lower Body Bathing: Maximal assistance, Sitting/lateral leans, Sit to/from stand, Cueing for safety, Cueing for sequencing Upper Body Dressing : Minimal assistance, Sitting, Cueing for safety, Cueing for sequencing Lower Body Dressing: Maximal assistance, Sitting/lateral leans, Sit to/from stand Lower Body Dressing Details (indicate cue type and reason): at EOB, attempting to perform leaning over then figure 4 technique, but pt unable to support L side of trunk without falling to L.  Toilet Transfer: Moderate assistance, Cueing for safety, Cueing for sequencing Toileting- Clothing Manipulation and Hygiene: Moderate assistance, +2 for physical assistance, +2 for safety/equipment, Cueing for safety, Cueing for sequencing, Sitting/lateral lean, Sit to/from stand Functional mobility during ADLs: Maximal assistance, Cueing for safety, Cueing for sequencing General ADL Comments: Pt with decreased cognition for simple commands, decreased strength on R side and decreased ability to care for self.    Mobility  Overal bed mobility: Needs Assistance Bed Mobility: Supine to Sit Supine to sit: Mod assist General bed mobility comments: pt initiates movement, needs modA to complete movement of BLE and some assist with trunk elevation. minA once sitting to maintain upright and min/modA to scoot to EOB    Transfers  Overall transfer level: Needs assistance Equipment used: Rolling walker (2 wheeled) Transfers: Sit to/from Stand, [employer]  Transfers Sit to Stand: Mod assist, +2 physical assistance Stand pivot transfers: Mod assist, +2 physical assistance General transfer comment: initially minA of 2 to stand from bed, modA of 2 to stand from [facility] due to strong posterior lean and pushing with all 4 extremities. modA to move RW and maintain upright with stand pivot to [facility]    Ambulation / Gait / Stairs / Wheelchair Mobility  Ambulation/Gait Ambulation/Gait assistance: Mod assist, +2 safety/equipment Gait Distance (Feet): 5 Feet Assistive device: Rolling walker (2 wheeled) Gait Pattern/deviations: Decreased  step length - right, Step-to pattern, Decreased stride length, Decreased weight shift to left, Shuffle, Trunk flexed, Wide base of support General Gait Details: Slow, unsteady steps with HHA and modA to maintain balance; shuffling gait with RLE lagging behind, difficulty taking complete step to progress RLE forwards or backwards despite max, multimodal cues Gait velocity: Decreased Gait velocity interpretation: <1.31 ft/sec, indicative of household ambulator    Posture / Balance Dynamic Sitting Balance Sitting balance - Comments: strong R lateral lean despite multiple cues, minA. Balance Overall balance assessment: Needs assistance, History of Falls Sitting-balance support: Bilateral upper extremity supported Sitting balance-Leahy Scale: Poor Sitting balance - Comments: strong R lateral lean despite multiple cues, minA. Postural control: Right lateral lean, Posterior lean Standing balance support: Bilateral upper extremity supported Standing balance-Leahy Scale: Poor Standing balance comment: poterior lean intially, progressed from modA of 1 to minG with continued standing for pericare.    Special needs/care consideration BiPAP/CPAP: no CPM: no Continuous Drip IV: ceftriaxone  Dialysis: no        Days: no Life Vest: no Oxygen: no, on RA Special Bed: no Trach Size: no Wound Vac (area): no      Location: no Skin:  ecchymosis to bilateral arms                      Bowel mgmt: continent Bladder mgmt: incontinent, external catheter Diabetic mgmt: no Behavioral consideration: flat affect, delayed processing/responses. Needs extra time to respond Chemo/radiation : no     Previous Home Environment (from acute therapy documentation)  Lives With: Family Available Help at Discharge: Family, Personal care attendant Type of Home: House Additional Comments: Pt unable to provide info; attempted to call son Yolanda Hopkins) but no answer  Discharge Living Setting Plans for Discharge Living Setting: House Type of Home at Discharge: House Discharge Home Layout: One level Discharge Home Access: Stairs to enter Entrance Stairs-Rails: Left Entrance Stairs-Number of Steps: 1 Discharge Bathroom Shower/Tub: Tub/shower unit Discharge Bathroom Toilet: Standard Discharge Bathroom Accessibility: Yes How Accessible: Accessible via walker Does the patient have any problems obtaining your medications?: No  Social/Family/Support Systems Patient Roles: Other (Comment)(close family support and caregiver) Contact Information: Yolanda Hopkins (son) [phone]; Yolanda Hopkins (daugther): [phone]; Herbie Baltimore (son in law): [phone] Anticipated Caregiver: Family Yolanda Hopkins, Yolanda Hopkins + hired caregiver aide) Anticipated Caregiver's Contact Information: see above Ability/Limitations of Caregiver: Min A Caregiver Availability: 24/7 Discharge Plan Discussed with Primary Caregiver: Yes(pt, Yolanda Hopkins) Is Caregiver In Agreement with Plan?: Yes Does Caregiver/Family have Issues with Lodging/Transportation while Pt is in Rehab?: No   Goals/Additional Needs Patient/Family Goal for Rehab: PT/OT/SLP: Min A Expected length of stay: 8-12 days Cultural Considerations: NA Dietary Needs: heart healthy, thin liquids Equipment Needs: TBD Special Service Needs: family is requesting neuropsych services 2/2 PTSD,  depression, recent loss of husband? Additional Information: NA Pt/Family Agrees to Admission and willing to participate: Yes Program Orientation Provided & Reviewed with Pt/Caregiver Including Roles  & Responsibilities: Yes(pt, Yolanda Hopkins)  Barriers to Discharge: Home environment access/layout  Barriers to Discharge Comments: steps to enter.    Decrease burden of Care through IP rehab admission: NA   Possible need for SNF placement upon discharge: Not anticipated; pt has great family support at DC with pt already receiving aide services M-F 1-6pm. Anticipate pt can reach a Min A level to be able to return home with 24/7 A.    Patient Condition: This patient's medical and functional status has changed since the consult dated: 08/02/19 in  which the Rehabilitation Physician determined and documented that the patient's condition is appropriate for intensive rehabilitative care in an inpatient rehabilitation facility. See "History of Present Illness" (above) for medical update. Functional changes are: improvement in transfers from Mod A +2 to Mod A. Patient's medical and functional status update has been discussed with the Rehabilitation physician and patient remains appropriate for inpatient rehabilitation. Will admit to inpatient rehab today.  Preadmission Screen Completed By:  Raechel Ache, OT, 08/05/2019 11:10 AM ______________________________________________________________________   Discussed status with Dr. Dagoberto Ligas on 3/25/21at 11:09AM and received approval for admission today.  Admission Coordinator:  Raechel Ache, time 11:09AM/Date 08/05/19

## 2019-08-05 NOTE — Progress Notes (Signed)
Inpatient Rehabilitation Medication Review by a Pharmacist  A complete drug regimen review was completed for this patient to identify any potential clinically significant medication issues.  Clinically significant medication issues were identified:  no  Pharmacist comments: No issues  Time spent performing this drug regimen review (minutes):  5   Tama Headings 08/05/2019 7:25 PM

## 2019-08-05 NOTE — Progress Notes (Signed)
  Speech Language Pathology Treatment: Cognitive-Linquistic  Patient Details Name: Yolanda Hopkins MRN: [id number] DOB: 1940/11/16 Today's Date: 08/05/2019 Time: 6063-0160 SLP Time Calculation (min) (ACUTE ONLY): 30 min  Assessment / Plan / Recommendation Clinical Impression  Pt continues to demonstrate paucity of speech. Utilized meal as an opportunity to elicit functional communication. Pt minimally responsive to social language, needed multiple repetitions to respond to small talk, though responses were typically one word. Verbalization of wants and needs branched from contextual cues to open ended questions to choice cues to Y/N responses. Pt responded in about 50% of opportunities. Named a few objects. Particular difficulty noted also in initiating functional tasks with visual and verbal cues needed. Pt also needed cueing to initiate swallows with cheeks packed with masticated sausage. Discussed with RN.   HPI HPI: Pt is an 79 y.o. female with past medical history significant for hypertension, tobacco abuse, dementia, tobacco abuse, prior stroke who was brought to the ED secondary to difficulty walking with patient dragging her right lower extremity. MRI brain: Multiple small acute infarcts within the left frontal white matter, in the left anterior cerebral artery territory.       SLP Plan  Continue with current plan of care       Recommendations                   Plan: Continue with current plan of care       GO              Harlon Ditty, MA CCC-SLP  Acute Rehabilitation Services Pager [phone] Office [phone]    Claudine Mouton 08/05/2019, 9:49 AM

## 2019-08-05 NOTE — Progress Notes (Signed)
Pt admitted to room 4W10. Oriented to floor and rehab fall policy. Denies any discomfort at this time. Pt currently in bed resting with call bell within reach.   Marylu Lund, RN

## 2019-08-05 NOTE — TOC Transition Note (Signed)
Transition of Care [facility]) - CM/SW Discharge Note   Patient Details  Name: Yolanda Hopkins MRN: [id number] Date of Birth: 10/04/1940  [facility] CM/SW Contact:  Kermit Balo, RN Phone Number: 08/05/2019, 11:03 AM   Clinical Narrative:    Pt discharging to CIR today. CM signing off.    Final next level of care: IP Rehab Facility Barriers to Discharge: No Barriers Identified   Patient Goals and CMS Choice        Discharge Placement                       Discharge Plan and Services                                     Social Determinants of Health (SDOH) Interventions     Readmission Risk Interventions No flowsheet data found.

## 2019-08-05 NOTE — Progress Notes (Signed)
Inpatient Rehabilitation-Admissions Coordinator   I am able to offer an IP Rehab bed to this patient today. I have received medical clearance from Dr. Blake Divine for admit to CIR today. Pt is in agreement. Reviewed insurance letter and consent forms with her son, Cristal Deer. RN and [facility] team notified of plan for today.   Please call if questions.   Cheri Rous, OTR/L  Rehab Admissions Coordinator  [phone] 08/05/2019 10:53 AM

## 2019-08-05 NOTE — Progress Notes (Signed)
Occupational Therapy Treatment Patient Details Name: Yolanda Hopkins MRN: [id number] DOB: 08-Apr-1941 Today's Date: 08/05/2019    History of present illness Pt is an 79 y.o. female admitted 07/30/19 after son noted pt dragging RLE was confused and had two falls. 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 Possible UTI. R hip imaging without fx. PMH includes recent R hip fx (04/2019), HTN, vertigo, depression.   OT comments  Cotx with PT to maximize pt safety and functional performance. Pt making progress in therapy. Pt tolerated sitting EOB ~10 min with variable supervision to min assist. Consistent right lateral lean with pt unable to self-correct, requiring min assist to maintain midline alignment. Pt able to transfer to/from [facility] with RW and mod assist x 2. Strong retro lean upon standing requiring mod assist for balance. Pt able to initiate clothing management, however continues to require max assist for peri care following BM. Pt tolerated standing ~5 min while completing toilet hygiene and attempting to ambulate to the bedroom sink. Pt setup in bedside chair and positioned for comfort. OT will continue to follow acutely. Continue to recommend CIR for additional rehab prior to discharge home.    Follow Up Recommendations  CIR;Supervision/Assistance - 24 hour    Equipment Recommendations  Other (comment)(TBD at next venue of care)    Recommendations for Other Services      Precautions / Restrictions Precautions Precautions: Fall Precaution Comments: cognitive deficits Restrictions Weight Bearing Restrictions: No       Mobility Bed Mobility Overal bed mobility: Needs Assistance Bed Mobility: Supine to Sit     Supine to sit: Mod assist     General bed mobility comments: HOB elevated. Able to reach and use rail on right with cues. Assist for BLEs and trunk.  Transfers Overall transfer level: Needs  assistance Equipment used: Rolling walker (2 wheeled) Transfers: Sit to/from [employer] Sit to Stand: Mod assist;+2 physical assistance;Min assist Stand pivot transfers: Mod assist;+2 physical assistance       General transfer comment: Initial min assist x 2 for sit/stand from bed. Pt progressed to mod assist x 2 for sit/stand from [facility].     Balance Overall balance assessment: Needs assistance;History of Falls Sitting-balance support: Bilateral upper extremity supported Sitting balance-Leahy Scale: Poor Sitting balance - Comments: Pt tolerated sitting EOB ~10 min with variable supervision to min assist. Right lateral lean with pt unable to self-correct without assist Postural control: Right lateral lean Standing balance support: Bilateral upper extremity supported Standing balance-Leahy Scale: Poor Standing balance comment: Initial strong posterior lean with pt progressing to more midline alignment, requiring less assist for balance.                            ADL either performed or assessed with clinical judgement   ADL Overall ADL's : Needs assistance/impaired     Grooming: Wash/dry hands;Wash/dry face;Set up;Supervision/safety;Cueing for sequencing;Bed level Grooming Details (indicate cue type and reason): Able to wash face and hands while seated upright in bed. Cues for sequencing and thoroughness                 Toilet Transfer: Moderate assistance;+2 for physical assistance;BSC Toilet Transfer Details (indicate cue type and reason): stand pivot to commode. Multimodal cues to move BLEs. Assist to move RW.  Toileting- Clothing Manipulation and Hygiene: +2 for physical assistance;Cueing for safety;Cueing for sequencing;Sit to/from stand;Moderate  assistance;Maximal assistance Toileting - Clothing Manipulation Details (indicate cue type and reason): Pt initiated moving clothing out of way prior to sitting. Assist for toilet hygiene following BM.      Functional mobility during ADLs: Moderate assistance;+2 for physical assistance;Rolling walker General ADL Comments: Pt tolerated sitting EOB 10 min able to stand pivot to/from [facility] with mod assist x 2.      Vision       Perception     Praxis      Cognition Arousal/Alertness: Awake/alert Behavior During Therapy: Flat affect Overall Cognitive Status: Impaired/Different from baseline Area of Impairment: Following commands;Safety/judgement;Problem solving;Orientation;Awareness                 Orientation Level: Disoriented to;Place;Time;Situation     Following Commands: Follows one step commands inconsistently;Follows one step commands with increased time Safety/Judgement: Decreased awareness of safety;Decreased awareness of deficits Awareness: Intellectual Problem Solving: Slow processing;Decreased initiation;Requires verbal cues;Requires tactile cues;Difficulty sequencing General Comments: Pt will inconsistently respond to yes/no questions. Increased time needed to process instructions. Mod to max repeat of instructions.        Exercises     Shoulder Instructions       General Comments No signs/symptoms of distress. Pt's daughter present for part of session.     Pertinent Vitals/ Pain       Pain Assessment: Faces Faces Pain Scale: Hurts little more Pain Location: buttocks/genitals Pain Descriptors / Indicators: Grimacing Pain Intervention(s): Limited activity within patient's tolerance;Monitored during session  Home Living                                          Prior Functioning/Environment              Frequency           Progress Toward Goals  OT Goals(current goals can now be found in the care plan section)  Progress towards OT goals: Progressing toward goals  Acute Rehab OT Goals Patient Stated Goal: None stated ADL Goals Pt Will Perform Lower Body Dressing: with supervision;sitting/lateral leans;sit to/from  stand Pt Will Transfer to Toilet: with min guard assist;ambulating Pt Will Perform Toileting - Clothing Manipulation and hygiene: with min guard assist;sitting/lateral leans;sit to/from stand Pt/caregiver will Perform Home Exercise Program: Increased strength;Both right and left upper extremity;With Supervision  Plan Discharge plan remains appropriate    Co-evaluation    PT/OT/SLP Co-Evaluation/Treatment: Yes Reason for Co-Treatment: Complexity of the patient's impairments (multi-system involvement);Necessary to address cognition/behavior during functional activity;For patient/therapist safety;To address functional/ADL transfers PT goals addressed during session: Mobility/safety with mobility;Balance;Strengthening/ROM OT goals addressed during session: ADL's and self-care      AM-PAC OT "6 Clicks" Daily Activity     Outcome Measure   Help from another person eating meals?: A Little Help from another person taking care of personal grooming?: A Little Help from another person toileting, which includes using toliet, bedpan, or urinal?: A Lot Help from another person bathing (including washing, rinsing, drying)?: A Lot Help from another person to put on and taking off regular upper body clothing?: A Lot Help from another person to put on and taking off regular lower body clothing?: A Lot 6 Click Score: 14    End of Session Equipment Utilized During Treatment: Gait belt;Rolling walker  OT Visit Diagnosis: Unsteadiness on feet (R26.81);Muscle weakness (generalized) (M62.81);Other symptoms and signs involving cognitive function   Activity Tolerance Patient  tolerated treatment well   Patient Left in chair;with call bell/phone within reach;with chair alarm set;with nursing/sitter in room   Nurse Communication Mobility status        Time: 2409-7353 OT Time Calculation (min): 31 min  Charges: OT General Charges $OT Visit: 1 Visit OT Treatments $Self Care/Home Management : 8-22  mins  Mauri Brooklyn OTR/L [phone]   Mauri Brooklyn 08/05/2019, 11:53 AM

## 2019-08-05 NOTE — Progress Notes (Signed)
Horton Chin, MD  Physician  Physical Medicine and Rehabilitation  Consult Note      Signed  Date of Service:  08/02/2019  6:07 AM      Related encounter: ED to Hosp-Admission (Discharged) from 07/30/2019 in [place] [facility]      Signed      Expand AllCollapse All            Physical Medicine and Rehabilitation Consult Reason for Consult: Right side weakness and altered mental status Referring Physician: [facility]     HPI: Yolanda Hopkins is a 79 y.o. right-handed female with history of hypertension, depression, hyperlipidemia tobacco abuse and vertigo.  Presented 07/31/2019 with right-sided weakness as well as falls x2 and altered mental status.  Cranial CT scan negative for acute changes.  Patient did not receive TPA.  MRI of the brain showed multiple small acute infarcts within the left frontal white matter, and the left anterior cerebral artery territory.  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  CT angiogram of head and neck no intracranial arterial occlusion or high-grade stenosis.  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  Echocardiogram with ejection fraction of 60%.  Admission chemistries unremarkable, SARS coronavirus negative.  Neurology follow-up currently on aspirin 325 mg and Plavix for CVA prophylaxis x3 months then aspirin alone due to severe left ACA stenosis.  Subcutaneous Lovenox for DVT prophylaxis.  Therapy evaluations completed with recommendations of physical medicine rehab consult.     Review of Systems  Constitutional: Negative for chills and fever.  HENT: Negative for hearing loss.   Eyes: Negative for blurred vision and double vision.  Respiratory: Negative for shortness of breath.   Cardiovascular: Negative for chest pain, palpitations and leg swelling.  Gastrointestinal: Positive for constipation. Negative for heartburn and nausea.  Genitourinary: Negative for dysuria, flank  pain and hematuria.  Musculoskeletal: Positive for falls and myalgias.  Skin: Negative for rash.  Neurological: Positive for weakness.       Vertigo  Psychiatric/Behavioral: Positive for memory loss.  All other systems reviewed and are negative.       Past Medical History:  Diagnosis Date  . Hypertension    . Tobacco abuse 08/19/2013  . Vertigo           Past Surgical History:  Procedure Laterality Date  . COLONOSCOPY N/A 05/23/2016    Procedure: COLONOSCOPY;  Surgeon: Jeani Hawking, MD;  Location: [facility] ENDOSCOPY;  Service: Endoscopy;  Laterality: N/A;  . ESOPHAGOGASTRODUODENOSCOPY N/A 05/23/2016    Procedure: ESOPHAGOGASTRODUODENOSCOPY (EGD);  Surgeon: Jeani Hawking, MD;  Location[facility] ENDOSCOPY;  Service: Endoscopy;  Laterality: N/A;  . GIVENS CAPSULE STUDY N/A 05/23/2016    Procedure: GIVENS CAPSULE STUDY;  Surgeon: Jeani Hawking, MD;  Location: [facility] ENDOSCOPY;  Service: Endoscopy;  Laterality: N/A;         Family History  Problem Relation Age of Onset  . Hypertension Mother    . Hypertension Father    . Liver cancer Father      Social History:  reports that she has been smoking cigarettes. She has never used smokeless tobacco. She reports that she does not drink alcohol or use drugs. Allergies:       Allergies  Allergen Reactions  . Mango Flavor Nausea Only and Other (See Comments)      Flushing and extreme lethargy (effect is long-lasting and familial)  . Poison Ivy Extract Rash  . Sulfa Antibiotics Rash  Medications Prior to Admission  Medication Sig Dispense Refill  . Ibuprofen-diphenhydrAMINE Cit (ADVIL PM) 200-38 MG TABS Take 1 tablet by mouth at bedtime as needed (for sleep).      . mirtazapine (REMERON) 15 MG tablet Take 15 mg by mouth at bedtime.      . rosuvastatin (CRESTOR) 10 MG tablet Take 1 tablet (10 mg total) by mouth daily. (Patient not taking: Reported on 07/31/2019) 30 tablet 0      Home: Home Living Family/patient expects to be discharged to::  Private residence Additional Comments: Pt unable to provide info; attempted to call son Cristal Deer) but no answer  Functional History: Prior Function Level of Independence: Needs assistance Comments: Per chart, pt was walking with walker, 2x falls leading to admission. Pt poor historian; attempted to call son who did not answer Functional Status:  Mobility: Bed Mobility Overal bed mobility: Needs Assistance Bed Mobility: Supine to Sit Supine to sit: Mod assist General bed mobility comments: Pt initiating movement, but required assistance getting LEs off of bed from sidelying and lifting trunk into seated position Transfers Overall transfer level: Needs assistance Equipment used: 1 person hand held assist Transfers: Sit to/from Stand Sit to Stand: Mod assist, +2 safety/equipment General transfer comment: Pt initiating standing, holding tight onto both forearms for support, Ambulation/Gait Ambulation/Gait assistance: Mod assist, +2 safety/equipment Gait Distance (Feet): 40 Feet Assistive device: 2 person hand held assist Gait Pattern/deviations: Decreased step length - right, Step-to pattern, Decreased stride length, Decreased weight shift to left, Shuffle, Trunk flexed, Wide base of support General Gait Details: Slow, unsteady steps with HHA and modA to maintain balance; shuffling gait with RLE lagging behind, difficulty taking complete step to progress RLE forwards or backwards despite max, multimodal cues Gait velocity: Decreased Gait velocity interpretation: <1.31 ft/sec, indicative of household ambulator   ADL: ADL Overall ADL's : Needs assistance/impaired Eating/Feeding: Set up, Sitting Eating/Feeding Details (indicate cue type and reason): Increased time after set-upA Grooming: Minimal assistance, Standing Upper Body Bathing: Minimal assistance, Standing Lower Body Bathing: Maximal assistance, Sitting/lateral leans, Sit to/from stand, Cueing for safety, Cueing for  sequencing Upper Body Dressing : Minimal assistance, Sitting, Cueing for safety, Cueing for sequencing Lower Body Dressing: Maximal assistance, Sitting/lateral leans, Sit to/from stand Lower Body Dressing Details (indicate cue type and reason): at EOB, attempting to perform leaning over then figure 4 technique, but pt unable to support L side of trunk without falling to L.  Toilet Transfer: Moderate assistance, Cueing for safety, Cueing for sequencing Toileting- Clothing Manipulation and Hygiene: Moderate assistance, +2 for physical assistance, +2 for safety/equipment, Cueing for safety, Cueing for sequencing, Sitting/lateral lean, Sit to/from stand Functional mobility during ADLs: Maximal assistance, Cueing for safety, Cueing for sequencing General ADL Comments: Pt with decreased cognition for simple commands, decreased strength on R side and decreased ability to care for self.   Cognition: Cognition Overall Cognitive Status: No family/caregiver present to determine baseline cognitive functioning Orientation Level: Oriented to person, Oriented to time [employer] Scales of Cognitive Functioning: Automatic/appropriate Cognition Arousal/Alertness: Awake/alert Behavior During Therapy: Flat affect Overall Cognitive Status: No family/caregiver present to determine baseline cognitive functioning Area of Impairment: Following commands, Safety/judgement, Problem solving Orientation Level: Disoriented to, Place, Time, Situation(Pt stating name, "march", "2021") Current Attention Level: Focused, Sustained Following Commands: Follows one step commands inconsistently, Follows one step commands with increased time Awareness: Intellectual Problem Solving: Slow processing, Decreased initiation, Requires verbal cues, Requires tactile cues, Difficulty sequencing General Comments: Answered correctly 3/3 biographical yes/no questions (  name, DOB, son's name). Did not verbalize unless prompted. Required  question to be repeated >50% of the time.  Difficult to assess due to: Impaired communication   Blood pressure (!) 141/72, pulse 67, temperature 97.8 F (36.6 C), temperature source Oral, resp. rate 16, SpO2 97 %.     General: Alert and oriented x 3, No apparent distress HEENT: Head is normocephalic, atraumatic, PERRLA, EOMI, sclera anicteric, oral mucosa pink and moist, dentition intact, ext ear canals clear,  Neck: Supple without JVD or lymphadenopathy Heart: Reg rate and rhythm. No murmurs rubs or gallops Chest: CTA bilaterally without wheezes, rales, or rhonchi; no distress Abdomen: Soft, non-tender, non-distended, bowel sounds positive. Extremities: No clubbing, cyanosis, or edema. Pulses are 2+ Skin: Clean and intact without signs of breakdown Neuro: Patient is alert but frequently closes her eyes.  Oriented to self not age or place.  She does follow simple commands.  Very limited medical historian   Musculoskeletal: LUE: 4/5 throughout LLE: 4/5 throughout RUE: 3/5 SA, EF, EE, 4/5 WF and hand grip RLE: 3/5 HF, KE, 4/5 DF, PF Psych: Pt's affect is flat. Pt is cooperative    Lab Results Last 24 Hours  No results found for this or any previous visit (from the past 24 hour(s)).    Imaging Results (Last 48 hours)  ECHOCARDIOGRAM COMPLETE   Result Date: 08/01/2019    ECHOCARDIOGRAM REPORT   Patient Name:   Yolanda Hopkins Date of Exam: 08/01/2019 Medical Rec #:  [id number]        Height:       64.0 in Accession #:    [id number]       Weight:       95.9 lb Date of Birth:  1941-02-26        BSA:          1.431 m Patient Age:    79 years         BP:           153/82 mmHg Patient Gender: F                HR:           74 bpm. Exam Location:  Inpatient Procedure: 2D Echo, Color Doppler and Cardiac Doppler Indications:    Stroke i163.9  History:        Patient has prior history of Echocardiogram examinations, most                 recent 08/20/2013. Risk Factors:Hypertension and Dyslipidemia.   Sonographer:    Irving Burton Senior RDCS Referring Phys: [id number] ARSHAD N KAKRAKANDY IMPRESSIONS  1. Left ventricular ejection fraction, by estimation, is 55 to 60%. The left ventricle has normal function. The left ventricle has no regional wall motion abnormalities. Left ventricular diastolic parameters are consistent with Grade I diastolic dysfunction (impaired relaxation).  2. Right ventricular systolic function is normal. The right ventricular size is normal. There is normal pulmonary artery systolic pressure. The estimated right ventricular systolic pressure is 21.0 mmHg.  3. The mitral valve is grossly normal. Trivial mitral valve regurgitation.  4. The aortic valve is tricuspid. Aortic valve regurgitation is not visualized. Mild aortic valve sclerosis is present, with no evidence of aortic valve stenosis. FINDINGS  Left Ventricle: Left ventricular ejection fraction, by estimation, is 55 to 60%. The left ventricle has normal function. The left ventricle has no regional wall motion abnormalities. The left ventricular internal cavity size was normal in size. There is  borderline left ventricular hypertrophy. Left ventricular diastolic parameters are consistent with Grade I diastolic dysfunction (impaired relaxation). Right Ventricle: The right ventricular size is normal. No increase in right ventricular wall thickness. Right ventricular systolic function is normal. There is normal pulmonary artery systolic pressure. The tricuspid regurgitant velocity is 2.12 m/s, and  with an assumed right atrial pressure of 3 mmHg, the estimated right ventricular systolic pressure is 21.0 mmHg. Left Atrium: Left atrial size was normal in size. Right Atrium: Right atrial size was normal in size. Pericardium: There is no evidence of pericardial effusion. Presence of pericardial fat pad. Mitral Valve: The mitral valve is grossly normal. Mild mitral annular calcification. Trivial mitral valve regurgitation. Tricuspid Valve: The tricuspid  valve is grossly normal. Tricuspid valve regurgitation is trivial. Aortic Valve: The aortic valve is tricuspid. Aortic valve regurgitation is not visualized. Mild aortic valve sclerosis is present, with no evidence of aortic valve stenosis. Mild aortic valve annular calcification. Pulmonic Valve: The pulmonic valve was grossly normal. Pulmonic valve regurgitation is trivial. Aorta: The aortic root is normal in size and structure. IAS/Shunts: The interatrial septum appears to be lipomatous. No atrial level shunt detected by color flow Doppler.  LEFT VENTRICLE PLAX 2D LVIDd:         3.30 cm  Diastology LVIDs:         2.40 cm  LV e' lateral:   6.20 cm/s LV PW:         0.80 cm  LV E/e' lateral: 10.2 LV IVS:        1.00 cm  LV e' medial:    5.22 cm/s LVOT diam:     1.80 cm  LV E/e' medial:  12.1 LV SV:         67 LV SV Index:   47 LVOT Area:     2.54 cm  RIGHT VENTRICLE RV S prime:     13.80 cm/s TAPSE (M-mode): 1.6 cm LEFT ATRIUM             Index       RIGHT ATRIUM           Index LA diam:        2.30 cm 1.61 cm/m  RA Area:     16.20 cm LA Vol (A2C):   51.6 ml 36.05 ml/m RA Volume:   47.20 ml  32.97 ml/m LA Vol (A4C):   31.9 ml 22.28 ml/m LA Biplane Vol: 42.1 ml 29.41 ml/m  AORTIC VALVE LVOT Vmax:   119.00 cm/s LVOT Vmean:  81.600 cm/s LVOT VTI:    0.263 m  AORTA Ao Root diam: 3.00 cm MITRAL VALVE               TRICUSPID VALVE MV Area (PHT): 2.26 cm    TR Peak grad:   18.0 mmHg MV Decel Time: 335 msec    TR Vmax:        212.00 cm/s MV E velocity: 63.00 cm/s MV A velocity: 86.10 cm/s  SHUNTS MV E/A ratio:  0.73        Systemic VTI:  0.26 m                            Systemic Diam: 1.80 cm Nona DellSamuel Mcdowell MD Electronically signed by Nona DellSamuel Mcdowell MD Signature Date/Time: 08/01/2019/11:40:08 AM    Final          Assessment/Plan: Diagnosis: Left ACA scattered infarcts 1. Does the need for  close, 24 hr/day medical supervision in concert with the patient's rehab needs make it unreasonable for this patient to be  served in a less intensive setting? Yes 2. Co-Morbidities requiring supervision/potential complications: abulia, flat affect, right hemiparesis, dementia, HTN, HLD, current smoker, UTI 3. Due to bladder management, bowel management, safety, skin/wound care, disease management, medication administration, pain management and patient education, does the patient require 24 hr/day rehab nursing? Yes 4. Does the patient require coordinated care of a physician, rehab nurse, therapy disciplines of PT, OT, SLP to address physical and functional deficits in the context of the above medical diagnosis(es)? Yes Addressing deficits in the following areas: balance, endurance, locomotion, strength, transferring, bowel/bladder control, bathing, dressing, feeding, grooming, toileting, cognition, speech, language and psychosocial support 5. Can the patient actively participate in an intensive therapy program of at least 3 hrs of therapy per day at least 5 days per week? Yes 6. The potential for patient to make measurable gains while on inpatient rehab is good 7. Anticipated functional outcomes upon discharge from inpatient rehab are min assist  with PT, min assist with OT, min assist with SLP. 8. Estimated rehab length of stay to reach the above functional goals is: 10-14 days 9. Anticipated discharge destination: Home 10. Overall Rehab/Functional Prognosis: good   RECOMMENDATIONS: This patient's condition is appropriate for continued rehabilitative care in the following setting: CIR Patient has agreed to participate in recommended program. Yes Note that insurance prior authorization may be required for reimbursement for recommended care.   Comment: Mrs. Moga would be an excellent CIR candidate.  She does appear very tired and missed SLP today as a result; she may benefit from decreaed dose of Remeron. She did perform well with PT yesterday ambulating 40 feet ModAx2 with 2 person hand held assist. Thank you for this  consult. We will continue to follow in Mrs. Herrington's care.    Lavon Paganini Angiulli, PA-C 08/02/2019    I have personally performed a face to face diagnostic evaluation, including, but not limited to relevant history and physical exam findings, of this patient and developed relevant assessment and plan.  Additionally, I have reviewed and concur with the physician assistant's documentation above.   Leeroy Cha, MD        Revision History                     Routing History

## 2019-08-05 NOTE — Plan of Care (Signed)
Plan of care reviewed with pt and family. Up with her walker x2 assist. Enema and stool softeners given, pt disimpacted. VSS.  Report given to rehab RN. Pt will be transferred up shortly. Pt stable at this time.  Problem: Education: Goal: Knowledge of General Education information will improve Description: Including pain rating scale, medication(s)/side effects and non-pharmacologic comfort measures Outcome: Progressing   Problem: Health Behavior/Discharge Planning: Goal: Ability to manage health-related needs will improve Outcome: Progressing   Problem: Clinical Measurements: Goal: Ability to maintain clinical measurements within normal limits will improve Outcome: Progressing Goal: Will remain free from infection Outcome: Progressing Goal: Diagnostic test results will improve Outcome: Progressing Goal: Respiratory complications will improve Outcome: Progressing Goal: Cardiovascular complication will be avoided Outcome: Progressing   Problem: Activity: Goal: Risk for activity intolerance will decrease Outcome: Progressing   Problem: Nutrition: Goal: Adequate nutrition will be maintained Outcome: Progressing   Problem: Coping: Goal: Level of anxiety will decrease Outcome: Progressing   Problem: Elimination: Goal: Will not experience complications related to bowel motility Outcome: Progressing Goal: Will not experience complications related to urinary retention Outcome: Progressing   Problem: Pain Managment: Goal: General experience of comfort will improve Outcome: Progressing   Problem: Safety: Goal: Ability to remain free from injury will improve Outcome: Progressing   Problem: Skin Integrity: Goal: Risk for impaired skin integrity will decrease Outcome: Progressing   Problem: Education: Goal: Knowledge of disease or condition will improve Outcome: Progressing Goal: Knowledge of secondary prevention will improve Outcome: Progressing Goal: Knowledge of  patient specific risk factors addressed and post discharge goals established will improve Outcome: Progressing

## 2019-08-05 NOTE — Progress Notes (Signed)
Raechel Ache, OT  Rehab Admission Coordinator  Physical Medicine and Rehabilitation  PMR Pre-admission  Signed  Date of Service:  08/05/2019 10:57 AM      Related encounter: ED to Hosp-Admission (Discharged) from 07/30/2019 in [facility]      Signed        PMR Admission Coordinator Pre-Admission Assessment   Patient: Yolanda Hopkins is an 79 y.o., female MRN: [id number] DOB: [birth year] Height:   Weight:                                                                                                                                                    Insurance Information HMO:     PPO:      PCP:      IPA:      80/20: yes     OTHER:  PRIMARY: Medicare part A and B      Policy#: [id number]      Subscriber: patient CM Name:       Phone#:      Fax#:  Pre-Cert#:       Employer:  Benefits:  Phone #: online     Name: verified eligibility online via [place] on 08/05/19 Eff. Date: Part A and B effective 05/13/2004     Deduct: $1,484      Out of Pocket Max: NA      Life Max: NA CIR: Covered per Medicare guidelines once yearly deductible has been met      SNF: days 1-20, 100%, days 21-100, 80% Outpatient: 80%     Co-Pay: 20% Home Health: 100%      Co-Pay:  DME: 80%     Co-Pay: 20% Providers: Pt's choice SECONDARY: [employer]      Policy#: [id number]      Subscriber: patient CM Name:       Phone#:      Fax#:  Pre-Cert#:       Employer:  Benefits:  Phone #: [phone]     Name:  Eff. Date:      Deduct:       Out of Pocket Max:       Life Max:  CIR:       SNF: Outpatient:      Co-Pay: Home Health:       Co-Pay: DME:     Co-Pay:    Medicaid Application Date:       Case Manager:  Disability Application Date:      Case Worker:    The "Data Collection Information Summary" for patients in Inpatient Rehabilitation Facilities with attached "Privacy Act [place] Records" was provided and verbally reviewed with: Family   Emergency Contact Information           Contact Information     Name Relation Home Work [place], [place]  Son     [phone]    Zannie Kehr Daughter     [phone]    PARASKEVI, FUNEZ Daughter     [phone]    Joseph Berkshire Other     [phone]       Current Medical History  Patient Admitting Diagnosis: Left ACA scattered infarcts   History of Present Illness: Yolanda Hopkins is an 79 year old female with history of hypertension, depression, hyperlipidemia tobacco abuse and vertigo.  Presented 07/31/2019 with right side weakness as well as falls x2 with altered mental status.  Cranial CT scan negative for acute changes.  Patient did not receive TPA.  MRI of the brain showed multiple small acute infarcts within the left frontal white matter, and the left anterior cerebral artery territory.  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  CT angiogram of head and neck no intracranial arterial occlusion or high-grade stenosis.  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  Echocardiogram with ejection fraction of 60%.  Admission chemistries unremarkable, SARS coronavirus negative.  Neurology follow-up maintained on aspirin 325 mg and Plavix for CVA prophylaxis x3 months then aspirin alone due to severe left ACA stenosis.  Subcutaneous Lovenox for DVT prophylaxis.  Therapy evaluations completed and patient is to be admitted for a comprehensive rehab program on 08/05/19.   Complete NIHSS TOTAL: 6 Glasgow Coma Scale Score: 15   Past Medical History      Past Medical History:  Diagnosis Date  . Hypertension    . Tobacco abuse 08/19/2013  . Vertigo        Family History  family history includes Hypertension in her father and mother; Liver cancer in her father.   Prior Rehab/Hospitalizations:  Has the patient had prior rehab or hospitalizations prior to admission? Yes   Has the patient had major surgery during 100 days prior to admission? No   Current  Medications    Current Facility-Administered Medications:  .  acetaminophen (TYLENOL) tablet 650 mg, 650 mg, Oral, Q4H PRN **OR** acetaminophen (TYLENOL) 160 MG/5ML solution 650 mg, 650 mg, Per Tube, Q4H PRN **OR** acetaminophen (TYLENOL) suppository 650 mg, 650 mg, Rectal, Q4H PRN, Rise Patience, MD .  aspirin EC tablet 325 mg, 325 mg, Oral, Daily, Rosalin Hawking, MD, 325 mg at 08/05/19 0910 .  atorvastatin (LIPITOR) tablet 40 mg, 40 mg, Oral, q1800, Aroor, Lanice Schwab, MD, 40 mg at 08/04/19 1740 .  bisacodyl (DULCOLAX) EC tablet 5 mg, 5 mg, Oral, Daily PRN, Swayze, Ava, DO, 5 mg at 08/05/19 1051 .  cefTRIAXone (ROCEPHIN) 1 g in sodium chloride 0.9 % 100 mL IVPB, 1 g, Intravenous, Q24H, Rise Patience, MD, Last Rate: 200 mL/hr at 08/05/19 0643, 1 g at 08/05/19 0643 .  clopidogrel (PLAVIX) tablet 75 mg, 75 mg, Oral, Daily, Aroor, Lanice Schwab, MD, 75 mg at 08/05/19 0910 .  enoxaparin (LOVENOX) injection 40 mg, 40 mg, Subcutaneous, Q24H, Rise Patience, MD, 40 mg at 08/04/19 2234 .  mirtazapine (REMERON) tablet 7.5 mg, 7.5 mg, Oral, QHS, Swayze, Ava, DO, 7.5 mg at 08/04/19 2232 .  nicotine (NICODERM CQ - dosed in mg/24 hr) patch 7 mg, 7 mg, Transdermal, Daily, Aroor, Karena Addison R, MD, 7 mg at 08/05/19 0910 .  polyethylene glycol (MIRALAX / GLYCOLAX) packet 17 g, 17 g, Oral, BID, Lang Snow, FNP, 17 g at 08/05/19 0911 .  sodium chloride flush (NS) 0.9 % injection 10-40 mL, 10-40 mL, Intracatheter, Q12H, Swayze, Ava, DO,  10 mL at 08/05/19 0911 .  sodium chloride flush (NS) 0.9 % injection 10-40 mL, 10-40 mL, Intracatheter, PRN, Swayze, Ava, DO .  sodium phosphate (FLEET) 7-19 GM/118ML enema 1 enema, 1 enema, Rectal, Daily PRN, Lang Snow, FNP   Patients Current Diet:     Diet Order                      Diet - low sodium heart healthy           Diet Heart Room service appropriate? No; Fluid consistency: Thin  Diet effective now                   Precautions /  Restrictions Precautions Precautions: Fall Precaution Comments: cognitive deficits Restrictions Weight Bearing Restrictions: No    Has the patient had 2 or more falls or a fall with injury in the past year?Yes   Prior Activity Level Limited Community (1-2x/wk): not driving or working but would get out some. typically no AD use, but sometimes would use RW   Prior Functional Level Prior Function Level of Independence: Needs assistance Comments: Per chart, pt was walking with walker, 2x falls leading to admission. Pt poor historian; attempted to call son who did not answer   Self Care: Did the patient need help bathing, dressing, using the toilet or eating?  Independent   Indoor Mobility: Did the patient need assistance with walking from room to room (with or without device)? Independent   Stairs: Did the patient need assistance with internal or external stairs (with or without device)? Needed some help   Functional Cognition: Did the patient need help planning regular tasks such as shopping or remembering to take medications? Needed some help   Home Assistive Devices / Equipment   Prior Device Use: Indicate devices/aids used by the patient prior to current illness, exacerbation or injury? mostly no AD, did use RW at times   Current Functional Level Cognition   Arousal/Alertness: Awake/alert Overall Cognitive Status: Impaired/Different from baseline Difficult to assess due to: Impaired communication Current Attention Level: Alternating Orientation Level: Oriented to person, Disoriented to place, Disoriented to time, Disoriented to situation Following Commands: Follows one step commands inconsistently, Follows one step commands with increased time Safety/Judgement: Decreased awareness of safety, Decreased awareness of deficits General Comments: Was able to respond to verbal cues and answer yes/no questions; answered closed-ended questions about 75% of the times; alternated  attention between individuals in the room Comments: Cognition not formally evaluated secondary to language deficits  [employer] Scales of Cognitive Functioning: Automatic/appropriate    Extremity Assessment (includes Sensation/Coordination)   Upper Extremity Assessment: Generalized weakness, RUE deficits/detail, LUE deficits/detail RUE Deficits / Details: R sided weakness 3-/5 grossly  LUE Deficits / Details: 3+/5 MM grade  Lower Extremity Assessment: Generalized weakness, Defer to PT evaluation RLE Deficits / Details: Functionally <3/5 throughout; pt not following commands for formal strength testing, would wiggle toes LLE Deficits / Details: Functional </= 3/5; pt not following commands for formal strength testing     ADLs   Overall ADL's : Needs assistance/impaired Eating/Feeding: Set up, Sitting Eating/Feeding Details (indicate cue type and reason): Increased time after set-upA Grooming: Minimal assistance, Standing Upper Body Bathing: Minimal assistance, Standing Lower Body Bathing: Maximal assistance, Sitting/lateral leans, Sit to/from stand, Cueing for safety, Cueing for sequencing Upper Body Dressing : Minimal assistance, Sitting, Cueing for safety, Cueing for sequencing Lower Body Dressing: Maximal assistance, Sitting/lateral leans, Sit to/from stand Lower Body Dressing  Details (indicate cue type and reason): at EOB, attempting to perform leaning over then figure 4 technique, but pt unable to support L side of trunk without falling to L.  Toilet Transfer: Moderate assistance, Cueing for safety, Cueing for sequencing Toileting- Clothing Manipulation and Hygiene: Moderate assistance, +2 for physical assistance, +2 for safety/equipment, Cueing for safety, Cueing for sequencing, Sitting/lateral lean, Sit to/from stand Functional mobility during ADLs: Maximal assistance, Cueing for safety, Cueing for sequencing General ADL Comments: Pt with decreased cognition for simple  commands, decreased strength on R side and decreased ability to care for self.     Mobility   Overal bed mobility: Needs Assistance Bed Mobility: Supine to Sit Supine to sit: Mod assist General bed mobility comments: pt initiates movement, needs modA to complete movement of BLE and some assist with trunk elevation. minA once sitting to maintain upright and min/modA to scoot to EOB     Transfers   Overall transfer level: Needs assistance Equipment used: Rolling walker (2 wheeled) Transfers: Sit to/from Stand, [employer] Transfers Sit to Stand: Mod assist, +2 physical assistance Stand pivot transfers: Mod assist, +2 physical assistance General transfer comment: initially minA of 2 to stand from bed, modA of 2 to stand from [facility] due to strong posterior lean and pushing with all 4 extremities. modA to move RW and maintain upright with stand pivot to [facility]     Ambulation / Gait / Stairs / Wheelchair Mobility   Ambulation/Gait Ambulation/Gait assistance: Mod assist, +2 safety/equipment Gait Distance (Feet): 5 Feet Assistive device: Rolling walker (2 wheeled) Gait Pattern/deviations: Decreased step length - right, Step-to pattern, Decreased stride length, Decreased weight shift to left, Shuffle, Trunk flexed, Wide base of support General Gait Details: Slow, unsteady steps with HHA and modA to maintain balance; shuffling gait with RLE lagging behind, difficulty taking complete step to progress RLE forwards or backwards despite max, multimodal cues Gait velocity: Decreased Gait velocity interpretation: <1.31 ft/sec, indicative of household ambulator     Posture / Balance Dynamic Sitting Balance Sitting balance - Comments: strong R lateral lean despite multiple cues, minA. Balance Overall balance assessment: Needs assistance, History of Falls Sitting-balance support: Bilateral upper extremity supported Sitting balance-Leahy Scale: Poor Sitting balance - Comments: strong R lateral lean despite  multiple cues, minA. Postural control: Right lateral lean, Posterior lean Standing balance support: Bilateral upper extremity supported Standing balance-Leahy Scale: Poor Standing balance comment: poterior lean intially, progressed from modA of 1 to minG with continued standing for pericare.     Special needs/care consideration BiPAP/CPAP: no CPM: no Continuous Drip IV: ceftriaxone  Dialysis: no        Days: no Life Vest: no Oxygen: no, on RA Special Bed: no Trach Size: no Wound Vac (area): no      Location: no Skin: ecchymosis to bilateral arms                      Bowel mgmt: continent Bladder mgmt: incontinent, external catheter Diabetic mgmt: no Behavioral consideration: flat affect, delayed processing/responses. Needs extra time to respond Chemo/radiation : no        Previous Home Environment (from acute therapy documentation)  Lives With: Family Available Help at Discharge: Family, Personal care attendant Type of Home: House Additional Comments: Pt unable to provide info; attempted to call son Harrell Gave) but no answer   Discharge Living Setting Plans for Discharge Living Setting: House Type of Home at Discharge: House Discharge Home Layout: One level Discharge Home Access:  Stairs to enter Entrance Stairs-Rails: Left Entrance Stairs-Number of Steps: 1 Discharge Bathroom Shower/Tub: Tub/shower unit Discharge Bathroom Toilet: Standard Discharge Bathroom Accessibility: Yes How Accessible: Accessible via walker Does the patient have any problems obtaining your medications?: No   Social/Family/Support Systems Patient Roles: Other (Comment)(close family support and caregiver) Contact Information: Harrell Gave (son) [phone]; Otho Perl (daugther): [phone]; Herbie Baltimore (son in law): [phone] Anticipated Caregiver: Family Harrell Gave, Rosanne Sack + hired caregiver aide) Anticipated Caregiver's Contact Information: see above Ability/Limitations of  Caregiver: Min A Caregiver Availability: 24/7 Discharge Plan Discussed with Primary Caregiver: Yes(pt, Sol Blazing) Is Caregiver In Agreement with Plan?: Yes Does Caregiver/Family have Issues with Lodging/Transportation while Pt is in Rehab?: No     Goals/Additional Needs Patient/Family Goal for Rehab: PT/OT/SLP: Min A Expected length of stay: 8-12 days Cultural Considerations: NA Dietary Needs: heart healthy, thin liquids Equipment Needs: TBD Special Service Needs: family is requesting neuropsych services 2/2 PTSD, depression, recent loss of husband? Additional Information: NA Pt/Family Agrees to Admission and willing to participate: Yes Program Orientation Provided & Reviewed with Pt/Caregiver Including Roles  & Responsibilities: Yes(pt, Renee Rival)  Barriers to Discharge: Home environment access/layout  Barriers to Discharge Comments: steps to enter.      Decrease burden of Care through IP rehab admission: NA     Possible need for SNF placement upon discharge: Not anticipated; pt has great family support at DC with pt already receiving aide services M-F 1-6pm. Anticipate pt can reach a Min A level to be able to return home with 24/7 A.      Patient Condition: This patient's medical and functional status has changed since the consult dated: 08/02/19 in which the Rehabilitation Physician determined and documented that the patient's condition is appropriate for intensive rehabilitative care in an inpatient rehabilitation facility. See "History of Present Illness" (above) for medical update. Functional changes are: improvement in transfers from Mod A +2 to Mod A. Patient's medical and functional status update has been discussed with the Rehabilitation physician and patient remains appropriate for inpatient rehabilitation. Will admit to inpatient rehab today.   Preadmission Screen Completed By:  Raechel Ache, OT, 08/05/2019 11:10  AM ______________________________________________________________________   Discussed status with Dr. Dagoberto Ligas on 3/25/21at 11:09AM and received approval for admission today.   Admission Coordinator:  Raechel Ache, time 11:09AM/Date 08/05/19         Cosigned by: Courtney Heys, MD at 08/05/2019 11:12 AM  Revision History

## 2019-08-05 NOTE — H&P (Signed)
Physical Medicine and Rehabilitation Admission H&P    No chief complaint on file. : HPI: Yolanda Hopkins is an 79 year old right-handed female with history of hypertension, depression, hyperlipidemia tobacco abuse and vertigo.  Presented 07/31/2019 with right side weakness as well as falls x2 with altered mental status.  Cranial CT scan negative for acute changes.  Patient did not receive TPA.  MRI of the brain showed multiple small acute infarcts within the left frontal white matter, and the left anterior cerebral artery territory.  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  CT angiogram of head and neck no intracranial arterial occlusion or high-grade stenosis.  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  Echocardiogram with ejection fraction of 60%.  Admission chemistries unremarkable, SARS coronavirus negative.  Neurology follow-up maintained on aspirin 325 mg and Plavix for CVA prophylaxis x3 months then aspirin alone due to severe left ACA stenosis.  Subcutaneous Lovenox for DVT prophylaxis.  Therapy evaluations completed and patient was admitted for a comprehensive rehab program.  Pt's daughter reports pt was having urinary frequency prior ( also smoked whenever went to bathroom).  Hx of R partial THR and was "dragging her R leg even prior to CVA". Denies pain- LBM this AM- denies constipation. Also was completely unaware of having BM- incontinent- sounds light also be incontinent of urine as well.   Pt's daughter feels her mother knows where she is, and knows what's going on "now".   Review of Systems  Constitutional: Negative for chills and fever.  HENT: Negative for hearing loss.   Eyes: Negative for blurred vision and double vision.  Respiratory: Negative for cough and shortness of breath.   Cardiovascular: Negative for chest pain, palpitations and leg swelling.  Gastrointestinal: Positive for constipation. Negative for  heartburn, nausea and vomiting.  Genitourinary: Negative for dysuria, flank pain and hematuria.  Musculoskeletal: Positive for falls and myalgias.  Skin: Negative for rash.  Neurological: Positive for weakness. Negative for seizures.       Vertigo  Psychiatric/Behavioral: Positive for memory loss.  All other systems reviewed and are negative.  Past Medical History:  Diagnosis Date  . Hypertension   . Tobacco abuse 08/19/2013  . Vertigo    Past Surgical History:  Procedure Laterality Date  . COLONOSCOPY N/A 05/23/2016   Procedure: COLONOSCOPY;  Surgeon: Jeani Hawking, MD;  Location: [facility] ENDOSCOPY;  Service: Endoscopy;  Laterality: N/A;  . ESOPHAGOGASTRODUODENOSCOPY N/A 05/23/2016   Procedure: ESOPHAGOGASTRODUODENOSCOPY (EGD);  Surgeon: Jeani Hawking, MD;  Location: [facility] ENDOSCOPY;  Service: Endoscopy;  Laterality: N/A;  . GIVENS CAPSULE STUDY N/A 05/23/2016   Procedure: GIVENS CAPSULE STUDY;  Surgeon: Jeani Hawking, MD;  Location: [facility] ENDOSCOPY;  Service: Endoscopy;  Laterality: N/A;   Family History  Problem Relation Age of Onset  . Hypertension Mother   . Hypertension Father   . Liver cancer Father    Social History:  reports that she has been smoking cigarettes. She has never used smokeless tobacco. She reports that she does not drink alcohol or use drugs. Allergies:  Allergies  Allergen Reactions  . Mango Flavor Nausea Only and Other (See Comments)    Flushing and extreme lethargy (effect is long-lasting and familial)  . Poison Ivy Extract Rash  . Sulfa Antibiotics Rash   Medications Prior to Admission  Medication Sig Dispense Refill  . aspirin EC 325 MG EC tablet Take 1 tablet (325 mg total) by mouth daily. 30 tablet 0  .  atorvastatin (LIPITOR) 40 MG tablet Take 1 tablet (40 mg total) by mouth daily at 6 PM.    . clopidogrel (PLAVIX) 75 MG tablet Take 1 tablet (75 mg total) by mouth daily.    . mirtazapine (REMERON) 15 MG tablet Take 15 mg by mouth at bedtime.      Drug Regimen  Review Drug regimen was reviewed and remains appropriate with no significant issues identified  Home:     Functional History:    Functional Status:  Mobility:          ADL:    Cognition:      Physical Exam: There were no vitals taken for this visit. Physical Exam  Nursing note and vitals reviewed. Constitutional: She appears well-developed and well-nourished.  Elderly female who's L handed, eating lunch with daughter at bedside- sitting up in bedside chair; doesn't speak spontaneously at ALL- NAD  HENT:  Head: Normocephalic and atraumatic.  Nose: Nose normal.  Mouth/Throat: Oropharynx is clear and moist. No oropharyngeal exudate.  Mild R facial droop?- fixes with smile Tongue midline No facial sensation changes per pt- but she wasn't sure sounding  Eyes: Conjunctivae are normal. Right eye exhibits no discharge. Left eye exhibits no discharge.  Attempted to check EOMI- appeared to be intact, but kept turning her head to look back and forth- didn't see any nystagmus  Neck: No tracheal deviation present.  Cardiovascular:  RRR; no JVD  Respiratory: No stridor.  CTA B/L- good air movement  GI:  Soft, NT;  ND;  (+)hyperactive BS  Musculoskeletal:     Cervical back: Normal range of motion and neck supple.     Comments: LUE 5-/5 in deltoid, biceps, triceps, WE, grip and finger abd RUE- 4/5 in all same muscles LLE- HF 4/5, otherwise 5-/5 LLE- HF 2/5, KE and KF 4-/5, DF 3/5, PF 4-/5   Neurological:  Patient is alert in no acute distress.  Mood is flat.  She does make eye contact with examiner.  She provides her name place and year with some delay in processing.  Follows simple commands.  Very slowed processing and responses- no spontaneous speech- answered with 1 phrase answers- yes, no, or I don't know-  Sensation to light touch intact? - pt says yes, but didn't appear sure in all 4 extremities NO hoffman's, no clonus; no increased tone  When asked, said was 1922-  multiple times- so sounds like perseverating somewhat.   Skin:  Pulled IV out of dorsum of L hand- bruised, ecchymotic- there and on UEs/arms-   Psychiatric:  Flat affect    Results for orders placed or performed during the hospital encounter of 07/30/19 (from the past 48 hour(s))  Basic metabolic panel     Status: Abnormal   Collection Time: 08/04/19  5:00 AM  Result Value Ref Range   Sodium 139 135 - 145 mmol/L   Potassium 5.7 (H) 3.5 - 5.1 mmol/L   Chloride 107 98 - 111 mmol/L   CO2 24 22 - 32 mmol/L   Glucose, Bld 97 70 - 99 mg/dL    Comment: Glucose reference range applies only to samples taken after fasting for at least 8 hours.   BUN 24 (H) 8 - 23 mg/dL   Creatinine, Ser 2.26 0.44 - 1.00 mg/dL   Calcium 9.4 8.9 - 33.3 mg/dL   GFR calc non Af Amer >60 >60 mL/min   GFR calc Af Amer >60 >60 mL/min   Anion gap 8 5 - 15  Comment: Performed at [facility], [place] [address] [place], [place] [address]  Glucose, capillary     Status: Abnormal   Collection Time: 08/04/19 11:24 AM  Result Value Ref Range   Glucose-Capillary 101 (H) 70 - 99 mg/dL    Comment: Glucose reference range applies only to samples taken after fasting for at least 8 hours.  Basic metabolic panel     Status: Abnormal   Collection Time: 08/04/19  2:32 PM  Result Value Ref Range   Sodium 138 135 - 145 mmol/L   Potassium 4.4 3.5 - 5.1 mmol/L   Chloride 103 98 - 111 mmol/L   CO2 26 22 - 32 mmol/L   Glucose, Bld 111 (H) 70 - 99 mg/dL    Comment: Glucose reference range applies only to samples taken after fasting for at least 8 hours.   BUN 26 (H) 8 - 23 mg/dL   Creatinine, Ser 0.87 0.44 - 1.00 mg/dL   Calcium 9.5 8.9 - 10.3 mg/dL   GFR calc non Af Amer >60 >60 mL/min   GFR calc Af Amer >60 >60 mL/min   Anion gap 9 5 - 15    Comment: Performed at [facility] [address], [place], [place] [address]   No results found.     Medical Problem List and Plan: 1.  Right side weakness secondary  to left ACA scattered infarcts  -patient may  shower  -ELOS/Goals: 2- 2.5 weeks; goals supervision 2.  Antithrombotics: -DVT/anticoagulation: Lovenox  -antiplatelet therapy: Aspirin 325 mg daily and Plavix 75 mg daily x3 months then aspirin alone due to severe left ACA stenosis 3. Pain Management: Tylenol as needed 4. Mood/history of memory loss.  Remeron 7.5 mg nightly  -antipsychotic agents: N/A 5. Neuropsych: This patient is not capable of making decisions on her own behalf.Ox1-2 6. Skin/Wound Care: Routine skin checks 7. Fluids/Electrolytes/Nutrition: Routine in and outs with follow-up chemistries 8.  Hyperlipidemia and hx of HTN.  Lipitor; not on HTN meds- will monitor 9.  Tobacco abuse.  NicoDerm patch.  Provide counseling 10. Incontinent of bowel and bladder?- might need bowel program or timed voiding? 11. Hx of urinary frequency- not sure of underlying cause- heard from daughter, however could have been just her smoke break based on hx. 12. Dispo- home with daughter - 1 STE   Jethro Bolus, PA-C. 08/05/19   I have personally performed a face to face diagnostic evaluation of this patient and formulated the key components of the plan.  Additionally, I have personally reviewed laboratory data, imaging studies, as well as relevant notes and concur with the physician assistant's documentation above.   The patient's status has not changed from the original H&P.  Any changes in documentation from the acute care chart have been noted above.     Courtney Heys, MD 08/05/2019

## 2019-08-05 NOTE — Progress Notes (Signed)
Md put in dc orders after receiving call saying rehab bed ready. Primary RN called rehab, but was told bed was not ready at this time, will call back when bed is ready.

## 2019-08-06 ENCOUNTER — Inpatient Hospital Stay (HOSPITAL_COMMUNITY): Payer: PRIVATE HEALTH INSURANCE | Admitting: Speech Pathology

## 2019-08-06 ENCOUNTER — Inpatient Hospital Stay (HOSPITAL_COMMUNITY): Payer: PRIVATE HEALTH INSURANCE | Admitting: Occupational Therapy

## 2019-08-06 ENCOUNTER — Inpatient Hospital Stay (HOSPITAL_COMMUNITY): Payer: PRIVATE HEALTH INSURANCE | Admitting: Physical Therapy

## 2019-08-06 LAB — COMPREHENSIVE METABOLIC PANEL
ALT: 31 U/L (ref 0–44)
AST: 25 U/L (ref 15–41)
Albumin: 3.5 g/dL (ref 3.5–5.0)
Alkaline Phosphatase: 54 U/L (ref 38–126)
Anion gap: 10 (ref 5–15)
BUN: 28 mg/dL — ABNORMAL HIGH (ref 8–23)
CO2: 24 mmol/L (ref 22–32)
Calcium: 9.4 mg/dL (ref 8.9–10.3)
Chloride: 105 mmol/L (ref 98–111)
Creatinine, Ser: 0.68 mg/dL (ref 0.44–1.00)
GFR calc Af Amer: >60 mL/min (ref >60)
GFR calc non Af Amer: >60 mL/min (ref >60)
Glucose, Bld: 96 mg/dL (ref 70–99)
Potassium: 4.3 mmol/L (ref 3.5–5.1)
Sodium: 139 mmol/L (ref 135–145)
Total Bilirubin: 0.5 mg/dL (ref 0.3–1.2)
Total Protein: 6.4 g/dL — ABNORMAL LOW (ref 6.5–8.1)

## 2019-08-06 LAB — CBC WITH DIFFERENTIAL/PLATELET
Abs Immature Granulocytes: 0.02 10*3/uL (ref 0.00–0.07)
Basophils Absolute: 0.1 10*3/uL (ref 0.0–0.1)
Basophils Relative: 2 %
Eosinophils Absolute: 0.2 10*3/uL (ref 0.0–0.5)
Eosinophils Relative: 3 %
HCT: 42.3 % (ref 36.0–46.0)
Hemoglobin: 14.2 g/dL (ref 12.0–15.0)
Immature Granulocytes: 0 %
Lymphocytes Relative: 33 %
Lymphs Abs: 2.3 10*3/uL (ref 0.7–4.0)
MCH: 32.2 pg (ref 26.0–34.0)
MCHC: 33.6 g/dL (ref 30.0–36.0)
MCV: 95.9 fL (ref 80.0–100.0)
Monocytes Absolute: 0.7 10*3/uL (ref 0.1–1.0)
Monocytes Relative: 10 %
Neutro Abs: 3.5 10*3/uL (ref 1.7–7.7)
Neutrophils Relative %: 52 %
Platelets: 266 10*3/uL (ref 150–400)
RBC: 4.41 MIL/uL (ref 3.87–5.11)
RDW: 12.7 % (ref 11.5–15.5)
WBC: 6.8 10*3/uL (ref 4.0–10.5)
nRBC: 0 % (ref 0.0–0.2)

## 2019-08-06 NOTE — Evaluation (Signed)
Occupational Therapy Assessment and Plan  Patient Details  Name: Yolanda Hopkins MRN: [id number] Date of Birth: 1940/08/02  OT Diagnosis: abnormal posture, apraxia, cognitive deficits, hemiplegia affecting non-dominant side and muscle weakness (generalized) Rehab Potential: Rehab Potential (ACUTE ONLY): Good ELOS: 14-16 days   Today's Date: 08/06/2019 OT Individual Time: 1300-1410 OT Individual Time Calculation (min): 70 min     Problem List:  Patient Active Problem List   Diagnosis Date Noted  . Acute CVA (cerebrovascular accident) ([place]) 07/30/2019  . GI bleed 05/22/2016  . Pressure injury of skin 05/22/2016  . Loss of weight   . Dyslipidemia   . Acute ischemic left ACA stroke ([place]) 08/19/2013  . HTN (hypertension) 08/19/2013  . Tobacco abuse 08/19/2013  . Vertigo 08/19/2013  . Gait abnormality 08/19/2013  . CVA (cerebral infarction) 08/19/2013    Past Medical History:  Past Medical History:  Diagnosis Date  . Hypertension   . Tobacco abuse 08/19/2013  . Vertigo    Past Surgical History:  Past Surgical History:  Procedure Laterality Date  . COLONOSCOPY N/A 05/23/2016   Procedure: COLONOSCOPY;  Surgeon: Carol Ada, MD;  Location: [facility] ENDOSCOPY;  Service: Endoscopy;  Laterality: N/A;  . ESOPHAGOGASTRODUODENOSCOPY N/A 05/23/2016   Procedure: ESOPHAGOGASTRODUODENOSCOPY (EGD);  Surgeon: Carol Ada, MD;  Location: [facility] ENDOSCOPY;  Service: Endoscopy;  Laterality: N/A;  . GIVENS CAPSULE STUDY N/A 05/23/2016   Procedure: GIVENS CAPSULE STUDY;  Surgeon: Carol Ada, MD;  Location: [place];  Service: Endoscopy;  Laterality: N/A;    Assessment & Plan Clinical Impression:  Yolanda Hopkins is an 79 year old right-handed female with history of hypertension, depression, hyperlipidemia tobacco abuse and vertigo.  Presented 07/31/2019 with right side weakness as well as falls x2 with altered mental status.  Cranial CT scan negative for acute changes.  Patient did not receive TPA.   MRI of the brain showed multiple small acute infarcts within the left frontal white matter, and the left anterior cerebral artery territory.  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  CT angiogram of head and neck no intracranial arterial occlusion or high-grade stenosis.  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  Echocardiogram with ejection fraction of 60%.  Admission chemistries unremarkable, SARS coronavirus negative.  Neurology follow-up maintained on aspirin 325 mg and Plavix for CVA prophylaxis x3 months then aspirin alone due to severe left ACA stenosis.  Subcutaneous Lovenox for DVT prophylaxis.  Therapy evaluations completed and patient was admitted for a comprehensive rehab program.  Patient currently requires Mod-Max with basic self-care skills secondary to muscle weakness, decreased cardiorespiratoy endurance, motor apraxia and decreased coordination, decreased initiation, decreased attention, decreased awareness, decreased problem solving and decreased safety awareness and decreased sitting balance, decreased standing balance and decreased postural control.  Prior to hospitalization, patient could complete BADLs with modified independent .  Patient will benefit from skilled intervention to increase independence with basic self-care skills prior to discharge home with support from family and PCA.  Anticipate patient will require 24 hour supervision and minimal physical assistance and follow up home health.  OT - End of Session Endurance Deficit: Yes OT Assessment Rehab Potential (ACUTE ONLY): Good OT Barriers to Discharge: Medical stability OT Patient demonstrates impairments in the following area(s): Balance;Perception;Safety;Cognition;Skin Integrity;Endurance;Motor OT Basic ADL's Functional Problem(s): Grooming;Bathing;Dressing;Toileting OT Advanced ADL's Functional Problem(s): Simple Meal Preparation OT Transfers Functional  Problem(s): Toilet;Tub/Shower OT Additional Impairment(s): None OT Plan OT Intensity: Minimum of 1-2 x/day, 45 to 90 minutes  OT Frequency: 5 out of 7 days OT Duration/Estimated Length of Stay: 14-16 days OT Treatment/Interventions: Balance/vestibular training;Discharge planning;Pain management;Self Care/advanced ADL retraining;Therapeutic Activities;UE/LE Coordination activities;Cognitive remediation/compensation;Disease mangement/prevention;Functional mobility training;Patient/family education;Therapeutic Exercise;Visual/perceptual remediation/compensation;UE/LE Strength taining/ROM;Wheelchair propulsion/positioning;Psychosocial support;Neuromuscular re-education;DME/adaptive equipment instruction;Community reintegration OT Self Feeding Anticipated Outcome(s): No goal OT Basic Self-Care Anticipated Outcome(s): Supervision-CGA OT Toileting Anticipated Outcome(s): Supervision-CGA OT Bathroom Transfers Anticipated Outcome(s): CGA OT Recommendation Recommendations for Other Services: Speech consult Patient destination: Home Follow Up Recommendations: 24 hour supervision/assistance Equipment Recommended: To be determined Skilled Therapeutic Intervention Skilled OT session completed with focus on initial evaluation, education on OT role/POC, and establishment of patient-centered goals.  Pt greeted in the recliner after lunch, very slouched and leaning towards the Lt side. Disoriented to place, time, and situation when asked, though she was able to state she had a CVA when given option of 2. Bathing/dressing tasks completed sit<stand at the sink afterwards. She needed mod vcs to wash LB at max level of independence, able to utilize figure 4 on the Lt side but needed assist to maintain position on the Rt side. Pt able to state correctly which hip she had replaced in the past (Rt hip). Min A for sit<stand with pt pulling up on sink, Mod A for dynamic standing balance due to posterior bias/lean. She was  able to complete pericare with balance assist alone and also fully elevate pants. Note that she was incontinent of B+B in brief before washing. OT donned Teds and pt was able to assist with dressing her Lt side, needed A for threading Rt LE into pants and donning Rt gripper sock. Mod A for donning overhead shirt. Afterwards she stood at the sink to complete oral care and hand hygiene, noted sequencing and problem solving deficits with pt placing her Rt hand under faucet vs using levers to turn on the water. She also couldn't open the toothpaste using her Rt hand for meeting [facility] demands, and therefore began brushing without toothpaste or rinsing brush. Pt also needed vcs to wash hands using soap. Next pt practiced toilet transfers with and without Stedy. Pt declined attempting to void though was provided with the opportunity. Pt required Mod A for stand pivot with vcs for technique, particularly with advancing Rt LE, heavy use of grab bar during transfer. She required Min A for using of Stedy with facilitation and cuing for improving hip extension to lower and elevate paddles. Recommended Stedy for staff and recorded this on safety plan. Pt completed stand pivot<bed with Mod-Max A and no AD, pt initiating reaching for bedrail and positioning therapist by Lt shoulder for increased balance support. Min A for transition to supine where pt boosted herself up towards HOB using feet when bed was placed in trenelenburg position. She needed verbal and tactile cues to figure out how to roll in order for OT to readjust her chuck pad. Noted increased difficultly with motor planning when rolling towards Lt. Pt reported no pain, however she tended to swear during bed mobility and during transfers in general. Pt remained in bed with all needs within reach and bed alarm set.   OT Evaluation Precautions/Restrictions  Precautions Precautions: Fall Vital Signs Therapy Vitals Temp: 98.7 F (37.1 C) Pulse Rate: 75 Resp:  18 BP: 99/60 Patient Position (if appropriate): Sitting Oxygen Therapy SpO2: 96 % O2 Device: Room Air Pain Pain Assessment Pain Scale: Faces Faces Pain Scale: No hurt Home Living/Prior Functioning Home Living Family/patient expects to be discharged to:: Private residence Living Arrangements: Children Available Help at  Discharge: Family, Personal care attendant Type of Home: House Home Access: Stairs to enter Technical brewer of Steps: 1 Home Layout: One level Bathroom Shower/Tub: Optometrist: Yes  Lives With: Family IADL History Type of Occupation: Per pt, 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 Pt poor historian; attempted to call son who did not answer ADL ADL Eating: Not assessed Grooming: Moderate assistance Where Assessed-Grooming: Standing at sink Upper Body Bathing: Supervision/safety Where Assessed-Upper Body Bathing: Sitting at sink Lower Body Bathing: Moderate assistance Where Assessed-Lower Body Bathing: Standing at sink, Sitting at sink Upper Body Dressing: Moderate assistance Where Assessed-Upper Body Dressing: Sitting at sink Lower Body Dressing: Maximal assistance Where Assessed-Lower Body Dressing: Standing at sink, Sitting at sink Toileting: Not assessed Toilet Transfer: Moderate assistance Toilet Transfer Method: Stand pivot Toilet Transfer Equipment: Energy manager: Not assessed Vision Baseline Vision/History: No visual deficits Vision Assessment?: Vision impaired- to be further tested in functional context Perception  Perception: Impaired Inattention/Neglect: Does not attend to right side of body Praxis Praxis: Impaired Praxis  Impairment Details: Perseveration;Motor planning Praxis-Other Comments: unable to advance then Rt LE in stance. Cognition Overall Cognitive Status: Impaired/Different from baseline Arousal/Alertness: Awake/alert Orientation Level: Person;Place;Situation Person: Oriented Place: Disoriented Situation: Disoriented Year: Other (Comment)(1922) Month: April Day of Week: Incorrect Memory: Impaired Immediate Memory Recall: Blue;Sock Memory Recall Sock: With Cue Memory Recall Blue: With Cue Memory Recall Bed: With Cue Awareness: Impaired Problem Solving: Impaired Safety/Judgment: Impaired Sensation Coordination Gross Motor Movements are Fluid and Coordinated: No Fine Motor Movements are Fluid and Coordinated: No(fine motor deficits Rt hand evidenced during grooming tasks) Finger Nose Finger Test: WFL BUE Motor  Motor Motor: Hemiplegia;Motor apraxia;Motor impersistence;Abnormal postural alignment and control Motor - Skilled Clinical Observations: RLE hemiplegia. poor motor planning and awareness of RLE Mobility  Mod A stand pivot toilet transfer using grab bar Trunk/Postural Assessment  Cervical Assessment Cervical Assessment: Exceptions to WFL(forward head) Thoracic Assessment Thoracic Assessment: Exceptions to WFL(rounded shoulders) Lumbar Assessment Lumbar Assessment: Exceptions to WFL(posterior pelvic tilt) Postural Control Postural Control: Deficits on evaluation(posterior bias during sitting/standing activity, also Lt lean/LOBs during dynamic sitting tasks) Protective Responses: delayed  Balance Balance Balance Assessed: Yes Dynamic Sitting Balance Dynamic Sitting - Balance Support: No upper extremity supported;During functional activity Dynamic Sitting - Level of Assistance: 3: Mod assist(donning gripper socks using figure 4 position) Dynamic Sitting - Balance Activities: Lateral lean/weight shifting;Forward lean/weight shifting;Reaching for objects Dynamic Standing  Balance Dynamic Standing - Balance Support: No upper extremity supported Dynamic Standing - Level of Assistance: 3: Mod assist Dynamic Standing - Balance Activities: Lateral lean/weight shifting;Forward lean/weight shifting Dynamic Standing - Comments: LB dressing Extremity/Trunk Assessment RUE Assessment RUE Assessment: Within Functional Limits(Note that pt had difficultly motor planning during informal assessment) Active Range of Motion (AROM) Comments: WNL other than ~150 degrees shoulder flexion LUE Assessment LUE Assessment: Within Functional Limits(note that pt had difficultly motor planning during informal assessment) Active Range of Motion (AROM) Comments: WNL   Refer to Care Plan for Long Term Goals  Recommendations for other services: None    Discharge Criteria: Patient will be discharged from OT if patient refuses treatment 3 consecutive times without medical reason, if treatment goals not met, if there is a change in medical status, if patient makes no progress  towards goals or if patient is discharged from hospital.  The above assessment, treatment plan, treatment alternatives and goals were discussed and mutually agreed upon: No family available/patient unable  Skeet Simmer 08/06/2019, 4:14 PM

## 2019-08-06 NOTE — Progress Notes (Signed)
Inpatient Rehabilitation  Patient information reviewed and entered into eRehab system by Stacy Deshler M. Lauren Aguayo, M.A., CCC/SLP, PPS Coordinator.  Information including medical coding, functional ability and quality indicators will be reviewed and updated through discharge.    

## 2019-08-06 NOTE — Progress Notes (Signed)
[facility] PHYSICAL MEDICINE & REHABILITATION PROGRESS NOTE   Subjective/Complaints:  Patient finished up with physical therapy.  Had been following commands well.  Some problems with orientation. The patient speaks very quietly.  She is able to follow simple commands.  She is oriented to person but not place, she can name the hospital from a list of 3 locations  Review of systems negative for chest pain shortness of breath nausea vomiting diarrhea constipation  Objective:   No results found. Recent Labs    08/05/19 1523 08/06/19 0707  WBC 6.2 6.8  HGB 15.1* 14.2  HCT 44.8 42.3  PLT 303 266   Recent Labs    08/04/19 1432 08/04/19 1432 08/05/19 1523 08/06/19 0707  NA 138  --   --  139  K 4.4  --   --  4.3  CL 103  --   --  105  CO2 26  --   --  24  GLUCOSE 111*  --   --  96  BUN 26*  --   --  28*  CREATININE 0.87   < > 1.00 0.68  CALCIUM 9.5  --   --  9.4   < > = values in this interval not displayed.    Intake/Output Summary (Last 24 hours) at 08/06/2019 0947 Last data filed at 08/06/2019 3976 Gross per 24 hour  Intake 320 ml  Output --  Net 320 ml     Physical Exam: Vital Signs Blood pressure 114/69, pulse 70, temperature 97.8 F (36.6 C), resp. rate 16, height 5' 4.5" (1.638 m), weight 53.8 kg, SpO2 96 %.   General: No acute distress Mood and affect are appropriate Heart: Regular rate and rhythm no rubs murmurs or extra sounds Lungs: Clear to auscultation, breathing unlabored, no rales or wheezes Abdomen: Positive bowel sounds, soft nontender to palpation, nondistended Extremities: No clubbing, cyanosis, or edema Skin: No evidence of breakdown, no evidence of rash Neurologic: Cranial nerves II through XII intact, motor strength is 5/5 in bilateral deltoid, bicep, tricep, grip, hip flexor, knee extensors, ankle dorsiflexor and plantar flexor Sensory exam normal sensation to light touch and proprioception in bilateral upper and lower extremities Cerebellar  exam normal finger to nose to finger as well as heel to shin in bilateral upper and lower extremities Musculoskeletal: Full range of motion in all 4 extremities. No joint swelling   Assessment/Plan: 1. 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  Physiatrist is providing close team supervision and 24 hour management of active medical problems listed below.  Physiatrist and rehab team continue to assess barriers to discharge/monitor patient progress toward functional and medical goals  Care Tool:  Bathing              Bathing assist       Upper Body Dressing/Undressing Upper body dressing   What is the patient wearing?: Hospital gown only    Upper body assist Assist Level: Minimal Assistance - Patient > 75%    Lower Body Dressing/Undressing Lower body dressing      What is the patient wearing?: Incontinence brief     Lower body assist Assist for lower body dressing: Maximal Assistance - Patient 25 - 49%     Toileting Toileting    Toileting assist Assist for toileting: Total Assistance - Patient < 25%     Transfers Chair/bed transfer  Transfers assist     Chair/bed transfer  assist level: Moderate Assistance - Patient 50 - 74%     Locomotion Ambulation   Ambulation assist              Walk 10 feet activity   Assist           Walk 50 feet activity   Assist           Walk 150 feet activity   Assist           Walk 10 feet on uneven surface  activity   Assist           Wheelchair     Assist               Wheelchair 50 feet with 2 turns activity    Assist            Wheelchair 150 feet activity     Assist          Blood pressure 114/69, pulse 70, temperature 97.8 F (36.6 C), resp. rate 16, height 5' 4.5" (1.638 m), weight 53.8 kg, SpO2 96 %.   Medical Problem List and  Plan: 1.  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  Antithrombotics: -DVT/anticoagulation: Lovenox             -antiplatelet therapy: Aspirin 325 mg daily and Plavix 75 mg daily x3 months then aspirin alone due to severe left ACA stenosis 3. Pain Management: Tylenol as needed 4. Mood/history of memory loss.  Remeron 7.5 mg nightly             -antipsychotic agents: N/A 5. Neuropsych: This patient is not capable of making decisions on her own behalf. 6. Skin/Wound Care: Routine skin checks 7. Fluids/Electrolytes/Nutrition: Routine in and outs with follow-up chemistries 8.  Hyperlipidemia and hx of HTN.  Lipitor; not on HTN meds- will monitor 9.  Tobacco abuse.  NicoDerm patch.  Provide counseling 10. Incontinent of bowel and bladder?- might need bowel program or timed voiding? 11. Hx of urinary frequency- not sure of underlying cause- heard from daughter, however could have been just her smoke break based on hx. 12. Dispo- home with daughter - 1 STE    LOS: 1 days A FACE TO FACE EVALUATION WAS PERFORMED  Charlett Blake 08/06/2019, 9:47 AM

## 2019-08-06 NOTE — Evaluation (Signed)
Physical Therapy Assessment and Plan  Patient Details  Name: Yolanda Hopkins MRN: [id number] Date of Birth: 09-Jul-1940  PT Diagnosis: Abnormal posture, Abnormality of gait, Ataxic gait, Coordination disorder, Hemiplegia dominant and Muscle weakness Rehab Potential: Good ELOS: 14-18 days   Today's Date: 08/06/2019 PT Individual Time: 0920-1030 PT Individual Time Calculation (min): 70 min    Problem List:  Patient Active Problem List   Diagnosis Date Noted  . Acute CVA (cerebrovascular accident) ([place]) 07/30/2019  . GI bleed 05/22/2016  . Pressure injury of skin 05/22/2016  . Loss of weight   . Dyslipidemia   . Acute ischemic left ACA stroke ([place]) 08/19/2013  . HTN (hypertension) 08/19/2013  . Tobacco abuse 08/19/2013  . Vertigo 08/19/2013  . Gait abnormality 08/19/2013  . CVA (cerebral infarction) 08/19/2013    Past Medical History:  Past Medical History:  Diagnosis Date  . Hypertension   . Tobacco abuse 08/19/2013  . Vertigo    Past Surgical History:  Past Surgical History:  Procedure Laterality Date  . COLONOSCOPY N/A 05/23/2016   Procedure: COLONOSCOPY;  Surgeon: Carol Ada, MD;  Location: [facility] ENDOSCOPY;  Service: Endoscopy;  Laterality: N/A;  . ESOPHAGOGASTRODUODENOSCOPY N/A 05/23/2016   Procedure: ESOPHAGOGASTRODUODENOSCOPY (EGD);  Surgeon: Carol Ada, MD;  Location[facility] ENDOSCOPY;  Service: Endoscopy;  Laterality: N/A;  . GIVENS CAPSULE STUDY N/A 05/23/2016   Procedure: GIVENS CAPSULE STUDY;  Surgeon: Carol Ada, MD;  Location: [facility];  Service: Endoscopy;  Laterality: N/A;    Assessment & Plan Clinical Impression: Patient is a 79 y.o.right-handed femalewith history of hypertension, depression, hyperlipidemia tobacco abuse and vertigo. Presented 07/31/2019 with right-sided weakness as well as falls x2 and altered mental status. Cranial CT scan negative for acute changes. Patient did not receive TPA. MRI of the brain showed multiple small acute infarcts  within the left frontal white matter, and the left anterior cerebral artery territory. 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 CT angiogram of head and neck no intracranial arterial occlusion or high-grade stenosis. 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 Echocardiogram with ejection fraction of 60%.   Patient transferred to CIR on 08/05/2019 .   Patient currently requires max with mobility secondary to muscle weakness, muscle joint tightness and muscle paralysis, decreased cardiorespiratoy endurance, unbalanced muscle activation, motor apraxia, decreased coordination and decreased motor planning, decreased visual perceptual skills, decreased attention to right, decreased initiation, decreased attention, decreased awareness, decreased problem solving, decreased safety awareness, decreased memory and delayed processing and decreased sitting balance, decreased standing balance, decreased postural control, hemiplegia and decreased balance strategies.  Prior to hospitalization, patient was modified independent  with mobility and lived with Family in a House home.  Home access is  Level entry.  Patient will benefit from skilled PT intervention to maximize safe functional mobility, minimize fall risk and decrease caregiver burden for planned discharge home with 24 hour assist.  Anticipate patient will benefit from follow up [facility] at discharge.  PT - End of Session Activity Tolerance: Tolerates < 10 min activity, no significant change in vital signs Endurance Deficit: Yes PT Assessment Rehab Potential (ACUTE/IP ONLY): Good PT Barriers to Discharge: Decreased caregiver support;Medical stability;Home environment access/layout PT Patient demonstrates impairments in the following area(s): Balance PT Transfers Functional Problem(s): Bed Mobility;Bed to Chair;Furniture;Car;Floor PT Locomotion Functional Problem(s): Ambulation;Wheelchair  Mobility;Stairs PT Plan PT Intensity: Minimum of 1-2 x/day ,45 to 90 minutes PT Frequency: 5 out of 7 days PT Duration Estimated Length of  Stay: 14-18 days PT Treatment/Interventions: Ambulation/gait training;Discharge planning;Functional mobility training;Psychosocial support;Therapeutic Activities;Visual/perceptual remediation/compensation;Wheelchair propulsion/positioning;Therapeutic Exercise;Skin care/wound management;Neuromuscular re-education;Disease management/prevention;Balance/vestibular training;Cognitive remediation/compensation;DME/adaptive equipment instruction;Pain management;Splinting/orthotics;UE/LE Strength taining/ROM;UE/LE Coordination activities;Stair training;Patient/family education;Functional electrical stimulation;Community reintegration PT Transfers Anticipated Outcome(s): CGA with LRAD PT Locomotion Anticipated Outcome(s): min assist ambulatory at house hold level with LRAD PT Recommendation Follow Up Recommendations: Home health PT Patient destination: Home Equipment Recommended: To be determined  Skilled Therapeutic Intervention Pt received supine in bed and agreeable to PT. Supine>sit transfer with mod assist and max cues for sequencing and awareness of the RLE. Sitting balance EOB with min assist initally progressing to supervision assist with RUE supported throughout to prevent R lateral lean. PT instructed patient in PT Evaluation and initiated treatment intervention; see below for results. PT educated patient in [place], rehab potential, rehab goals, and discharge recommendations.    Gait training without AD and HHA on the R UE as listed below. Max assist to advance the RLE due to poor awareness of LE secondary to proprioceptive deficits and possible motor planning deficits. Additional gait training with RW x 7f with mod assist for adequate step length height with RLE. Gait training also performed at rail in hall 2x 119fwith mod-max assist to initiate and perform RLE  swing.   WC mobility x 258fith min-mod assist as listed below. Car transfer performed with mod assist to enter car and max assist to exit due to poor RLE motor planning and R side inattention, causing pt to turn 270deg to transfer back to chair on the R.   Patient returned to room and performed stand pivot transfer with RW and mod assist to Recliner; poor step length on the R LE. Pt left sitting in recliner with call bell in reach and all needs met.      PT Evaluation Precautions/Restrictions   fall. Poor motor planning  Pain    denies Home Living/Prior Functioning Home Living Available Help at Discharge: Family;Personal care attendant Type of Home: House Home Access: Level entry  Lives With: Family Prior Function Level of Independence: Requires assistive device for independence Vocation: Retired Comments: Per chart, pt was walking with walker, 2x falls leading to admission. Pt poor historian; attempted to call son who did not answer Vision/Perception  Perception Perception: Impaired Inattention/Neglect: Does not attend to right side of body Praxis Praxis: Impaired Praxis Impairment Details: Perseveration;Motor planning Praxis-Other Comments: unable to advance then RLW in stance.  Cognition Overall Cognitive Status: Impaired/Different from baseline Orientation Level: Oriented to person;Oriented to time;Disoriented to place;Disoriented to situation Memory: Impaired Awareness: Impaired Problem Solving: Impaired Safety/Judgment: Impaired Sensation Sensation Light Touch: Impaired Detail Central sensation comments: parathesia in the RLE Light Touch Impaired Details: Impaired RLE Coordination Gross Motor Movements are Fluid and Coordinated: No Finger Nose Finger Test: WFL BUE Heel Shin Test: dysmetria on the RLE. limited by strength deficits Motor  Motor Motor: Hemiplegia;Motor apraxia;Motor impersistence Motor - Skilled Clinical Observations: RLE hemiplegia. poor motor  planning and awareness of RLE  Mobility Bed Mobility Bed Mobility: Rolling Left;Rolling Right;Supine to Sit;Sit to Supine Rolling Right: Moderate Assistance - Patient 50-74% Rolling Left: Moderate Assistance - Patient 50-74% Supine to Sit: Moderate Assistance - Patient 50-74% Sit to Supine: Moderate Assistance - Patient 50-74% Transfers Transfers: Sit to Stand;Stand Pivot Transfers Sit to Stand: Maximal Assistance - Patient 25-49% Stand Pivot Transfers: Maximal Assistance - Patient 25 - 49% Stand Pivot Transfer Details: Visual cues/gestures for sequencing;Verbal cues for sequencing;Verbal cues for technique;Verbal cues for precautions/safety;Manual facilitation for placement;Manual facilitation  for weight shifting;Verbal cues for gait pattern Transfer (Assistive device): 1 person hand held assist Locomotion  Gait Ambulation: Yes Gait Assistance: Maximal Assistance - Patient 25-49% Gait Distance (Feet): 5 Feet Assistive device: 1 person hand held assist Gait Gait: Yes Gait Pattern: Impaired Gait Pattern: Decreased step length - right;Ataxic;Right steppage;Narrow base of support;Poor foot clearance - right Stairs / Additional Locomotion Stairs: No Wheelchair Mobility Wheelchair Mobility: Yes Wheelchair Assistance: Maximal Assistance - Patient 25 - 49% Wheelchair Propulsion: Both upper extremities Wheelchair Parts Management: Needs assistance Distance: 25  Trunk/Postural Assessment  Cervical Assessment Cervical Assessment: Exceptions to WFL(forward head. limited rotation Bil) Thoracic Assessment Thoracic Assessment: Within Functional Limits Lumbar Assessment Lumbar Assessment: Exceptions to WFL(posterior pelvic tilt) Postural Control Postural Control: Deficits on evaluation Protective Responses: delayed  Balance Balance Balance Assessed: Yes Static Sitting Balance Static Sitting - Balance Support: Right upper extremity supported Static Sitting - Level of Assistance: 4: Min  assist Static Sitting - Comment/# of Minutes: R lateral lean Dynamic Sitting Balance Dynamic Sitting - Balance Support: Right upper extremity supported Dynamic Sitting - Level of Assistance: 4: Min assist Static Standing Balance Static Standing - Balance Support: Right upper extremity supported Static Standing - Level of Assistance: 4: Min assist Dynamic Standing Balance Dynamic Standing - Balance Support: Right upper extremity supported Dynamic Standing - Level of Assistance: 3: Mod assist Extremity Assessment      RLE Assessment RLE Assessment: Exceptions to [facility] General Strength Comments: grossly 4/5 proximal to distal except hip flexion 3+/5 LLE Assessment LLE Assessment: Within Functional Limits    Refer to Care Plan for Long Term Goals  Recommendations for other services: None   Discharge Criteria: Patient will be discharged from PT if patient refuses treatment 3 consecutive times without medical reason, if treatment goals not met, if there is a change in medical status, if patient makes no progress towards goals or if patient is discharged from hospital.  The above assessment, treatment plan, treatment alternatives and goals were discussed and mutually agreed upon: by patient  Lorie Phenix 08/06/2019, 1:04 PM

## 2019-08-06 NOTE — Plan of Care (Signed)
  Problem: RH Balance Goal: LTG Patient will maintain dynamic standing with ADLs (OT) Description: LTG:  Patient will maintain dynamic standing balance with assist during activities of daily living (OT)  Flowsheets (Taken 08/06/2019 1622) LTG: Pt will maintain dynamic standing balance during ADLs with: Contact Guard/Touching assist   Problem: Sit to Stand Goal: LTG:  Patient will perform sit to stand in prep for activites of daily living with assistance level (OT) Description: LTG:  Patient will perform sit to stand in prep for activites of daily living with assistance level (OT) Flowsheets (Taken 08/06/2019 1622) LTG: PT will perform sit to stand in prep for activites of daily living with assistance level: Contact Guard/Touching assist   Problem: RH Grooming Goal: LTG Patient will perform grooming w/assist,cues/equip (OT) Description: LTG: Patient will perform grooming with assist, with/without cues using equipment (OT) Flowsheets (Taken 08/06/2019 1622) LTG: Pt will perform grooming with assistance level of: Supervision/Verbal cueing   Problem: RH Bathing Goal: LTG Patient will bathe all body parts with assist levels (OT) Description: LTG: Patient will bathe all body parts with assist levels (OT) Flowsheets (Taken 08/06/2019 1622) LTG: Pt will perform bathing with assistance level/cueing: Contact Guard/Touching assist   Problem: RH Dressing Goal: LTG Patient will perform upper body dressing (OT) Description: LTG Patient will perform upper body dressing with assist, with/without cues (OT). Flowsheets (Taken 08/06/2019 1622) LTG: Pt will perform upper body dressing with assistance level of: Supervision/Verbal cueing Goal: LTG Patient will perform lower body dressing w/assist (OT) Description: LTG: Patient will perform lower body dressing with assist, with/without cues in positioning using equipment (OT) Flowsheets (Taken 08/06/2019 1622) LTG: Pt will perform lower body dressing with  assistance level of: Contact Guard/Touching assist   Problem: RH Toileting Goal: LTG Patient will perform toileting task (3/3 steps) with assistance level (OT) Description: LTG: Patient will perform toileting task (3/3 steps) with assistance level (OT)  Flowsheets (Taken 08/06/2019 1622) LTG: Pt will perform toileting task (3/3 steps) with assistance level: Contact Guard/Touching assist   Problem: RH Toilet Transfers Goal: LTG Patient will perform toilet transfers w/assist (OT) Description: LTG: Patient will perform toilet transfers with assist, with/without cues using equipment (OT) Flowsheets (Taken 08/06/2019 1622) LTG: Pt will perform toilet transfers with assistance level of: Contact Guard/Touching assist   Problem: RH Tub/Shower Transfers Goal: LTG Patient will perform tub/shower transfers w/assist (OT) Description: LTG: Patient will perform tub/shower transfers with assist, with/without cues using equipment (OT) Flowsheets (Taken 08/06/2019 1622) LTG: Pt will perform tub/shower stall transfers with assistance level of: Contact Guard/Touching assist

## 2019-08-06 NOTE — IPOC Note (Addendum)
Overall Plan of Care [facility]) Patient Details Name: Yolanda Hopkins MRN: [id number] DOB: [birth year]  Admitting Diagnosis: Acute ischemic left ACA stroke [facility])  Hospital Problems: Principal Problem:   Acute ischemic left ACA stroke (HCC) Active Problems:   History of hypertension   Urinary frequency   Urinary incontinence   Incomplete defecation   Slow transit constipation     Functional Problem List: Nursing Bladder, Bowel, Endurance, Motor, Nutrition, Pain, Perception, Safety, Skin Integrity, Medication Management  PT Balance  OT Balance, Perception, Safety, Cognition, Skin Integrity, Endurance, Motor  SLP Cognition  TR         Basic ADL's: OT Grooming, Bathing, Dressing, Toileting     Advanced  ADL's: OT Simple Meal Preparation     Transfers: PT Bed Mobility, Bed to Chair, Furniture, Set designer, Floor  OT Toilet, Research scientist (life sciences): PT Ambulation, Psychologist, prison and probation services, Stairs     Additional Impairments: OT None  SLP Swallowing expression Problem Solving, Memory, Awareness, Social Interaction  TR      Anticipated Outcomes Item Anticipated Outcome  Self Feeding No goal  Swallowing  Mod I   Basic self-care  Supervision-CGA  Toileting  Supervision-CGA   Bathroom Transfers CGA  Bowel/Bladder  manage bowel and bladder with min assist  Transfers  CGA with LRAD  Locomotion  min assist ambulatory at house hold level with LRAD  Communication  Min A for basic communication, Min A for speech intelligibility  Cognition  Min A for basic  Pain  pain level less than 4 on sclae of 0-10  Safety/Judgment  remain free of injury, prevent falls with cues and reminders   Therapy Plan: PT Intensity: Minimum of 1-2 x/day ,45 to 90 minutes PT Frequency: 5 out of 7 days PT Duration Estimated Length of Stay: 14-18 days OT Intensity: Minimum of 1-2 x/day, 45 to 90 minutes OT Frequency: 5 out of 7 days OT Duration/Estimated Length of Stay: 14-16 days SLP Intensity:  Minumum of 1-2 x/day, 30 to 90 minutes SLP Frequency: 3 to 5 out of 7 days SLP Duration/Estimated Length of Stay: 14 to 18 days   Due to the current state of emergency, patients may not be receiving their 3-hours of Medicare-mandated therapy.   Team Interventions: Nursing Interventions Patient/Family Education, Pain Management, Bladder Management, Medication Management, Discharge Planning, Bowel Management, Skin Care/Wound Management, Psychosocial Support, Cognitive Remediation/Compensation, Disease Management/Prevention  PT interventions Ambulation/gait training, Discharge planning, Functional mobility training, Psychosocial support, Therapeutic Activities, Visual/perceptual remediation/compensation, Wheelchair propulsion/positioning, Therapeutic Exercise, Skin care/wound management, Neuromuscular re-education, Disease management/prevention, Balance/vestibular training, Cognitive remediation/compensation, DME/adaptive equipment instruction, Pain management, Splinting/orthotics, UE/LE Strength taining/ROM, UE/LE Coordination activities, Stair training, Patient/family education, Functional electrical stimulation, Community reintegration  OT Interventions Warden/ranger, Discharge planning, Pain management, Self Care/advanced ADL retraining, Therapeutic Activities, UE/LE Coordination activities, Cognitive remediation/compensation, Disease mangement/prevention, Functional mobility training, Patient/family education, Therapeutic Exercise, Visual/perceptual remediation/compensation, UE/LE Strength taining/ROM, Wheelchair propulsion/positioning, Psychosocial support, Neuromuscular re-education, DME/adaptive equipment instruction, Community reintegration  SLP Interventions Dysphagia/aspiration precaution training, Therapeutic Activities, Environmental controls, Cueing hierarchy, Functional tasks  TR Interventions    SW/CM Interventions Discharge Planning, Disease Management/Prevention, Psychosocial  Support, Patient/Family Education   Barriers to Discharge MD  Medical stability  Nursing      PT Decreased caregiver support, Medical stability, Home environment access/layout    OT Medical stability    SLP      SW Decreased caregiver support     Team Discharge Planning: Destination: PT-Home ,OT- Home , SLP-Home Projected Follow-up: PT-Home health PT, OT-  24 hour supervision/assistance, SLP-Home Health SLP, Outpatient SLP, 24 hour supervision/assistance Projected Equipment Needs: PT-To be determined, OT- To be determined, SLP-None recommended by SLP Equipment Details: PT- , OT-  Patient/family involved in discharge planning: PT- Patient,  OT-Patient unable/family or caregiver not available, SLP-Patient  MD ELOS: 14-18d Medical Rehab Prognosis:  Good Assessment:  79 year old right-handed female with history of hypertension, depression, hyperlipidemia tobacco abuse and vertigo.  Presented 07/31/2019 with right side weakness as well as falls x2 with altered mental status.  Cranial CT scan negative for acute changes.  Patient did not receive TPA.  MRI of the brain showed multiple small acute infarcts within the left frontal white matter, and the left anterior cerebral artery territory.  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  CT angiogram of head and neck no intracranial arterial occlusion or high-grade stenosis.  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  Echocardiogram with ejection fraction of 60%.  Admission chemistries unremarkable, SARS coronavirus negative.  Neurology follow-up maintained on aspirin 325 mg and Plavix for CVA prophylaxis x3 months then aspirin alone due to severe left ACA stenosis.  Subcutaneous Lovenox for DVT prophylaxis.  Therapy evaluations completed and patient was admitted for a comprehensive rehab program   Now requiring 24/7 Rehab RN,MD, as well as CIR level PT, OT and SLP.  Treatment team will focus  on ADLs and mobility with goals set at [facility] A See Team Conference Notes for weekly updates to the plan of care

## 2019-08-06 NOTE — Evaluation (Signed)
Speech Language Pathology Assessment and Plan  Patient Details  Name: Yolanda Hopkins MRN: [id number] Date of Birth: 1941/01/23  SLP Diagnosis: Speech and Language deficits;Cognitive Impairments  Rehab Potential: Fair ELOS: 14 to 18 days    Today's Date: 08/06/2019 SLP Individual Time: 4098-1191 SLP Individual Time Calculation (min): 57 min   Problem List:  Patient Active Problem List   Diagnosis Date Noted  . Acute CVA (cerebrovascular accident) ([place]) 07/30/2019  . GI bleed 05/22/2016  . Pressure injury of skin 05/22/2016  . Loss of weight   . Dyslipidemia   . Acute ischemic left ACA stroke ([place]) 08/19/2013  . HTN (hypertension) 08/19/2013  . Tobacco abuse 08/19/2013  . Vertigo 08/19/2013  . Gait abnormality 08/19/2013  . CVA (cerebral infarction) 08/19/2013   Past Medical History:  Past Medical History:  Diagnosis Date  . Hypertension   . Tobacco abuse 08/19/2013  . Vertigo    Past Surgical History:  Past Surgical History:  Procedure Laterality Date  . COLONOSCOPY N/A 05/23/2016   Procedure: COLONOSCOPY;  Surgeon: Carol Ada, MD;  Location: [facility] ENDOSCOPY;  Service: Endoscopy;  Laterality: N/A;  . ESOPHAGOGASTRODUODENOSCOPY N/A 05/23/2016   Procedure: ESOPHAGOGASTRODUODENOSCOPY (EGD);  Surgeon: Carol Ada, MD;  Location[facility] ENDOSCOPY;  Service: Endoscopy;  Laterality: N/A;  . GIVENS CAPSULE STUDY N/A 05/23/2016   Procedure: GIVENS CAPSULE STUDY;  Surgeon: Carol Ada, MD;  Location: [place];  Service: Endoscopy;  Laterality: N/A;    Assessment / Plan / Recommendation Clinical Impression Yolanda Hopkins is an 79 year old right-handed female with history of hypertension, depression, hyperlipidemia tobacco abuse and vertigo.  Presented 07/31/2019 with right side weakness as well as falls x2 with altered mental status.  Cranial CT scan negative for acute changes.  Patient did not receive TPA.  MRI of the brain showed multiple small acute infarcts within the left  frontal white matter, and the left anterior cerebral artery territory.  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  CT angiogram of head and neck no intracranial arterial occlusion or high-grade stenosis.  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  Echocardiogram with ejection fraction of 60%.  Admission chemistries unremarkable, SARS coronavirus negative.  Neurology follow-up maintained on aspirin 325 mg and Plavix for CVA prophylaxis x3 months then aspirin alone due to severe left ACA stenosis.  Subcutaneous Lovenox for DVT prophylaxis.  Therapy evaluations completed and patient was admitted for a comprehensive rehab program on 08/05/19.    Per chart review, pt resides with her son and daughter and she has a care assistant 5 days a week for about 6 hours a day.  Daughter additionally reported that the pt was not displaying sign of cognitive decline, but that she was receiving full assistance for all IADLs (medications, finances, meals, etc.) at baseline.   Cognition was not formally evaluated on this date secondary to language deficits. 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   Cognitive lingusitic evaluation was completed on 08/06/19. Pt presents with overall impairments in task initiation, awareness of deficits, pragmatics, delayed processing and lengthy response times. Specific tasks were administer to assess cognition that circumvented any possible language deficits. Pt was not oriented to month,d ay of week, year, location or President. She was able to answer yes/no question related to herself and follow 3 step directions related to her self. Written cures provided and pt didn't refer to written information to respond to same questions. She required Max cues to use that information  to answer questions. Pt's also presents with dysarthria that is characterized by low vocal intensity and overall imprecise articulation. As a result, pt's speech is <50%  intelligible at the phrase level. Pt's expressive language abilities were difficult to assess as she spoke very little. When she did speak, her speech was fluent. Skilled ST is required to target the above mentioned deficits to increase pt's functional independence and reduce caregiver burden.      Skilled Therapeutic Interventions          See above  SLP Assessment  Patient will need skilled [place] [facility] Services during CIR admission    Recommendations  Recommendations for Other Services: Neuropsych consult Patient destination: (TBD) Follow up Recommendations: (TBD) Equipment Recommended: None recommended by SLP    SLP Frequency 3 to 5 out of 7 days   SLP Duration  SLP Intensity  SLP Treatment/Interventions 14 to 18 days  Minumum of 1-2 x/day, 30 to 90 minutes  Cognitive remediation/compensation;Speech/Language facilitation;Functional tasks;Therapeutic Activities;Internal/external aids;Patient/family education    Pain Pain Assessment Pain Scale: Faces Faces Pain Scale: No hurt  Prior Functioning Cognitive/Linguistic Baseline: Within functional limits Type of Home: House  Lives With: Family Available Help at Discharge: Family;Personal care attendant Vocation: Retired  SLP Evaluation Cognition Overall Cognitive Status: Impaired/Different from baseline Arousal/Alertness: Awake/alert Orientation Level: Oriented to person;Disoriented to place;Disoriented to time;Disoriented to situation Memory: Impaired Memory Impairment: Decreased recall of new information Immediate Memory Recall: Blue;Sock Memory Recall Sock: With Cue Memory Recall Blue: With Cue Memory Recall Bed: With Cue Awareness: Impaired Awareness Impairment: Intellectual impairment;Emergent impairment Problem Solving: Impaired Problem Solving Impairment: Verbal basic;Functional basic Executive Function: (all impaired by lower level deficits) Behaviors: Lability;Poor frustration  tolerance Safety/Judgment: Impaired  Comprehension Auditory Comprehension Overall Auditory Comprehension: Appears within functional limits for tasks assessed Visual Recognition/Discrimination Discrimination: Within Function Limits Reading Comprehension Reading Status: Within funtional limits Expression Expression Primary Mode of Expression: Verbal Verbal Expression Overall Verbal Expression: Impaired Initiation: Impaired Automatic Speech: Name Level of Generative/Spontaneous Verbalization: Phrase Repetition: No impairment Naming: No impairment Pragmatics: Impairment Impairments: Abnormal affect;Eye contact;Monotone;Interpretation of nonverbal communication Non-Verbal Means of Communication: Not applicable Written Expression Dominant Hand: Left Written Expression: Not tested Oral Motor Oral Motor/Sensory Function Overall Oral Motor/Sensory Function: Within functional limits      Short Term Goals: Week 1: SLP Short Term Goal 1 (Week 1): Pt will utilize external memory aids to recall orientiaotn information in 5 out ot 100 oportunities with Mod A cues. SLP Short Term Goal 2 (Week 1): Pt will utilize speech intelligibility strategies at the phrase level to increase speech inteligiblity to ~ 75% with Mod A cues. SLP Short Term Goal 3 (Week 1): Pt will utilize word finding strategies to increase fluency of speech at the phrase level with Max A cues. SLP Short Term Goal 4 (Week 1): Pt will copmlete basic familiar problem solving tasks with Mod A cues.  Refer to Care Plan for Long Term Goals  Recommendations for other services: Neuropsych  Discharge Criteria: Patient will be discharged from SLP if patient refuses treatment 3 consecutive times without medical reason, if treatment goals not met, if there is a change in medical status, if patient makes no progress towards goals or if patient is discharged from hospital.  The above assessment, treatment plan, treatment alternatives  and goals were discussed and mutually agreed upon: No family available/patient unable  Cale Bethard 08/06/2019, 5:10 PM

## 2019-08-07 ENCOUNTER — Inpatient Hospital Stay (HOSPITAL_COMMUNITY): Payer: PRIVATE HEALTH INSURANCE | Admitting: Physical Therapy

## 2019-08-07 ENCOUNTER — Inpatient Hospital Stay (HOSPITAL_COMMUNITY): Payer: PRIVATE HEALTH INSURANCE | Admitting: Occupational Therapy

## 2019-08-07 ENCOUNTER — Inpatient Hospital Stay (HOSPITAL_COMMUNITY): Payer: PRIVATE HEALTH INSURANCE | Admitting: Speech Pathology

## 2019-08-07 DIAGNOSIS — R350 Frequency of micturition: Secondary | ICD-10-CM

## 2019-08-07 DIAGNOSIS — R32 Unspecified urinary incontinence: Secondary | ICD-10-CM

## 2019-08-07 DIAGNOSIS — Z8679 Personal history of other diseases of the circulatory system: Secondary | ICD-10-CM

## 2019-08-07 NOTE — Progress Notes (Signed)
Physical Therapy Session Note  Patient Details  Name: Yolanda Hopkins MRN: [id number] Date of Birth: 10/18/1940  Today's Date: 08/07/2019 PT Individual Time: 0908-1002 PT Individual Time Calculation (min): 54 min   Short Term Goals: Week 1:  PT Short Term Goal 1 (Week 1): Pt will transfer to [facility] with min assist and LRAD PT Short Term Goal 2 (Week 1): Pt will ambulate 25f with mod assist and LRAD PT Short Term Goal 3 (Week 1): Pt will perform bed mobility with min assist PT Short Term Goal 4 (Week 1): Pt will propel WC 747fwith min assist  Skilled Therapeutic Interventions/Progress Updates:   Pt received supine in bed and agreeable to PT. Supine>sit transfer with mod assist and cues for sequencing. Pt continues to have slow initiation of movement in the RUE/RLE. Sitting balance EOB x 1030mwith mod assist initially progressing to supervision assist following max cues/facilition to perform adequate weight shift. PT assist pt to don pants and grip socks EOB with mod assist due to poor motor planning in the RLE. Sit<>stand with mod assist to pull pants to waist. stong Posterior lean initially, but improved with increased time in standing.   [facility] in parallel bars with min assist with mild posterior lean noted. Reciprocal foot taps on 2in step x 10 BLE. Pt then reports need for BM. [facility] to toilet with min assist and heavy use of rails. Pt able to void bladder, but unable to void bowels. Sit<>stand from toilet seat with mod assist. Min assist for balance to pull pants to waist.   Pt transported back to rehab gym. Reciprocal foot tap on 2 inch step. Pt able to advance BLE and return to standing position with min assist to prevent posterior LOB. PT then instructed pt in perform gait training with RW. Mod-max assist to facilitate sep through gait pattern on the R. Pt unable to performed step through on the R  regardless of cues without.   Patient returned to room and [facility] reach and all needs met.          Therapy Documentation Precautions:  Precautions Precautions: Fall Restrictions Weight Bearing Restrictions: No General:   Vital Signs:   Pain: Pain Assessment Pain Scale: 0-10 Pain Score: 0-No pain Mobility:   Locomotion :    Trunk/Postural Assessment :    Balance:   Exercises:   Other Treatments:      Therapy/Group: Individual Therapy  AusLorie Phenix27/2021, 11:07 AM

## 2019-08-07 NOTE — Progress Notes (Signed)
Patient up to toilet unable to have bowel movement. Patient required disimpaction of large amount of semi-hard stool to clear rectal vault. Patient given miralax this morning as scheduled.

## 2019-08-07 NOTE — Evaluation (Signed)
Speech Language Pathology Bedside Swallow Evaluation and Daily Session Note  Patient Details  Name: Yolanda Hopkins MRN: [id number] Date of Birth: March 02, 1941  SLP Diagnosis: Dysphagia  Rehab Potential: Fair ELOS: 14 to 18 days    Today's Date: 08/07/2019 SLP Individual Time: 6712-4580 SLP Individual Time Calculation (min): 45 min   Problem List:  Patient Active Problem List   Diagnosis Date Noted  . Acute CVA (cerebrovascular accident) ([place]) 07/30/2019  . GI bleed 05/22/2016  . Pressure injury of skin 05/22/2016  . Loss of weight   . Dyslipidemia   . Acute ischemic left ACA stroke ([place]) 08/19/2013  . HTN (hypertension) 08/19/2013  . Tobacco abuse 08/19/2013  . Vertigo 08/19/2013  . Gait abnormality 08/19/2013  . CVA (cerebral infarction) 08/19/2013   Past Medical History:  Past Medical History:  Diagnosis Date  . Hypertension   . Tobacco abuse 08/19/2013  . Vertigo    Past Surgical History:  Past Surgical History:  Procedure Laterality Date  . COLONOSCOPY N/A 05/23/2016   Procedure: COLONOSCOPY;  Surgeon: Carol Ada, MD;  Location: [facility] ENDOSCOPY;  Service: Endoscopy;  Laterality: N/A;  . ESOPHAGOGASTRODUODENOSCOPY N/A 05/23/2016   Procedure: ESOPHAGOGASTRODUODENOSCOPY (EGD);  Surgeon: Carol Ada, MD;  Location: [facility] ENDOSCOPY;  Service: Endoscopy;  Laterality: N/A;  . GIVENS CAPSULE STUDY N/A 05/23/2016   Procedure: GIVENS CAPSULE STUDY;  Surgeon: Carol Ada, MD;  Location: [place];  Service: Endoscopy;  Laterality: N/A;    Assessment / Plan / Recommendation Clinical Impression Skilled treatment session focused on cognitive goals. Upon arrival, patient's tray was present and had not been touched. Patient declined PO intake but initiated tray set-up when placed in front of her with overall Mod verbal and visual cues. Patient consumed regular textures with thin liquids without overt s/s of aspiration but demonstrated impulsivity with self-feeding and required Min-Mod  verbal cues for a slow rate and a liquid wash to clear oral residue. Recommend patient continue current diet but initiate full supervision to maximize initiation and overall safety with PO intake. Patient with a flat affect and required more than a reasonable amount of time for verbal responses but did spontaneously ask this clinician ~5 questions about herself. Patient was oriented to place from a field of 2 but was independently oriented to date. Patient educated on recommendations for full supervision and verbalized understanding. Patient left upright in bed with alarm on and all needs within reach. Continue with current plan of care.    Skilled Therapeutic Interventions          Administered a BSE, please see above for details.   SLP Assessment  Patient will need skilled [place] Pathology Services during CIR admission    Recommendations  SLP Diet Recommendations: Age appropriate regular solids;Thin Liquid Administration via: Cup;Straw Medication Administration: Whole meds with liquid Supervision: Patient able to self feed;Full supervision/cueing for compensatory strategies Compensations: Minimize environmental distractions;Slow rate;Small sips/bites;Lingual sweep for clearance of pocketing;Follow solids with liquid Postural Changes and/or Swallow Maneuvers: Seated upright 90 degrees Oral Care Recommendations: Oral care BID Recommendations for Other Services: Neuropsych consult Patient destination: Home Follow up Recommendations: Home Health SLP;Outpatient SLP;24 hour supervision/assistance Equipment Recommended: None recommended by SLP    SLP Frequency 3 to 5 out of 7 days   SLP Duration  SLP Intensity  SLP Treatment/Interventions 14 to 18 days  Minumum of 1-2 x/day, 30 to 90 minutes  Dysphagia/aspiration precaution training;Therapeutic Activities;Environmental controls;Cueing hierarchy;Functional tasks    Pain No/Denies Pain  Bedside Swallowing Assessment General Date  of Onset: 07/31/19 Previous Swallow Assessment: N/A Diet Prior to this Study: Regular;Thin liquids Temperature Spikes Noted: No Respiratory Status: Room air History of Recent Intubation: No Behavior/Cognition: Alert;Cooperative;Pleasant mood Oral Cavity - Dentition: Adequate natural dentition Self-Feeding Abilities: Able to feed self Patient Positioning: Upright in bed Baseline Vocal Quality: Normal Volitional Cough: Strong Volitional Swallow: Able to elicit  Ice Chips Ice chips: Not tested Thin Liquid Thin Liquid: Within functional limits Presentation: Straw Nectar Thick Nectar Thick Liquid: Not tested Honey Thick Honey Thick Liquid: Not tested Puree Puree: Within functional limits Solid Solid: Impaired Oral Phase Functional Implications: Oral residue BSE Assessment Risk for Aspiration Impact on safety and function: Mild aspiration risk Other Related Risk Factors: Cognitive impairment  Short Term Goals: Week 1: SLP Short Term Goal 1 (Week 1): Pt will utilize external memory aids to recall orientiaotn information in 5 out ot 100 oportunities with Mod A cues. SLP Short Term Goal 2 (Week 1): Pt will utilize speech intelligibility strategies at the phrase level to increase speech inteligiblity to ~ 75% with Mod A cues. SLP Short Term Goal 3 (Week 1): Pt will utilize word finding strategies to increase fluency of speech at the phrase level with Max A cues. SLP Short Term Goal 4 (Week 1): Pt will copmlete basic familiar problem solving tasks with Mod A cues. SLP Short Term Goal 5 (Week 1): Patient will consume current diet without overt s/s of aspiration and supervision level verbal cues for use of swallowing compensatory strategies.  Refer to Care Plan for Long Term Goals  Recommendations for other services: Neuropsych  Discharge Criteria: Patient will be discharged from SLP if patient refuses treatment 3 consecutive times without medical reason, if treatment goals not met, if  there is a change in medical status, if patient makes no progress towards goals or if patient is discharged from hospital.  The above assessment, treatment plan, treatment alternatives and goals were discussed and mutually agreed upon: by patient  Anthonio Mizzell 08/07/2019, 12:46 PM

## 2019-08-07 NOTE — Progress Notes (Addendum)
Occupational Therapy Session Note  Patient Details  Name: Yolanda Hopkins MRN: [id number] Date of Birth: Oct 07, 1940  Today's Date: 08/07/2019 OT Individual Time: 1300-1414 OT Individual Time Calculation (min): 74 min   Short Term Goals: Week 1:  OT Short Term Goal 1 (Week 1): Pt will complete 1/3 components of toileting with Mod balance assist OT Short Term Goal 2 (Week 1): Pt will complete UB dressing with Min A OT Short Term Goal 3 (Week 1): Pt will complete LB dressing with Mod A and LRAD  Skilled Therapeutic Interventions/Progress Updates:    Pt greeted in bed with no c/o pain. Agreeable to eat her lunch. Min A for transition to EOB using bedrail with HOB elevated. Worked on her sitting balance while eating her meal. Pt with much improved postural control when compared to session with this therapist yesterday. Min vcs for clearing oral cavity per swallowing precautions. Near end of meal pt required Mod A for sitting balance due to posterior LOBs. Pt able to improve posture for short windows of time but would lose her balance again once engaging in eating task. Min-mod A for stand pivot<w/c<toilet. Note that pt exhibited external distraction during transfers, reaching to floor to pick up items or reaching out for a shoe nearby. She had continent B+B void while sitting on toilet, note that she'd periodically compete perihygiene while seated before fully finishing her void, supervision for sitting balance at this time. While standing with Min balance assist, pt continued with perihygiene completion given increased time, able to do so thoroughly herself. Mod A for clothing mgt before returning to the w/c. Continued working on standing balance while completing handwashing at the sink. Pts posterior bias increased during this time with pt needing mod manual facilitation for improving hip extension. Pt required vcs for sequencing when washing her hands. She doffed her shirt with Mod A and donned a clean  one with supervision assist. At end of session pt opted to remain in the w/c, left her with all needs within reach and safety belt fastened.    Therapy Documentation Precautions:  Precautions Precautions: Fall Restrictions Weight Bearing Restrictions: No Vital Signs: Therapy Vitals Temp: 98.5 F (36.9 C) Temp Source: Oral Pulse Rate: 84 Resp: 18 BP: 116/71 Patient Position (if appropriate): Lying Oxygen Therapy SpO2: 96 % O2 Device: Room Air Pain:   ADL: ADL Eating: Not assessed Grooming: Moderate assistance Where Assessed-Grooming: Standing at sink Upper Body Bathing: Supervision/safety Where Assessed-Upper Body Bathing: Sitting at sink Lower Body Bathing: Moderate assistance Where Assessed-Lower Body Bathing: Standing at sink, Sitting at sink Upper Body Dressing: Moderate assistance Where Assessed-Upper Body Dressing: Sitting at sink Lower Body Dressing: Maximal assistance Where Assessed-Lower Body Dressing: Standing at sink, Sitting at sink Toileting: Not assessed Toilet Transfer: Moderate assistance Toilet Transfer Method: Stand pivot Toilet Transfer Equipment: Grab bars Tub/Shower Transfer: Not assessed     Therapy/Group: Individual Therapy  Yolanda Hopkins 08/07/2019, 4:45 PM

## 2019-08-07 NOTE — Progress Notes (Signed)
[facility] PHYSICAL MEDICINE & REHABILITATION PROGRESS NOTE   Subjective/Complaints: Patient seen sitting up in bed this morning.  She states she slept well overnight.  No reported issues.  She is still sleepy this morning.  She states she had a good first day of therapies yesterday,?  Reliability..  Review of systems: Denies CP, SOB, N/V/D,?  Reliability  Objective:   No results found. Recent Labs    08/05/19 1523 08/06/19 0707  WBC 6.2 6.8  HGB 15.1* 14.2  HCT 44.8 42.3  PLT 303 266   Recent Labs    08/05/19 1523 08/06/19 0707  NA  --  139  K  --  4.3  CL  --  105  CO2  --  24  GLUCOSE  --  96  BUN  --  28*  CREATININE 1.00 0.68  CALCIUM  --  9.4    Intake/Output Summary (Last 24 hours) at 08/07/2019 1755 Last data filed at 08/06/2019 1900 Gross per 24 hour  Intake 100 ml  Output --  Net 100 ml     Physical Exam: Vital Signs Blood pressure 116/71, pulse 84, temperature 98.5 F (36.9 C), temperature source Oral, resp. rate 18, height 5' 4.5" (1.638 m), weight 53.8 kg, SpO2 96 %. Constitutional: No distress . Vital signs reviewed. HENT: Normocephalic.  Atraumatic. Eyes: EOMI. No discharge. Cardiovascular: No JVD. Respiratory: Normal effort.  No stridor. GI: Non-distended. Skin: Warm and dry.  Intact. Psych: Normal mood.  Normal behavior. Musc: No edema in extremities.  No tenderness in extremities. Neurologic: Alert Motor: 5/5 throughout, except for right lower extremity, which is 2+/5 (?  Effort)  Assessment/Plan: 1. 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  Physiatrist is providing close team supervision and 24 hour management of active medical problems listed below.  Physiatrist and rehab team continue to assess barriers to discharge/monitor patient progress toward functional and medical goals  Care Tool:  Bathing    Body parts bathed  by patient: Right arm, Left arm, Chest, Abdomen, Front perineal area, Buttocks, Right upper leg, Left upper leg, Face   Body parts bathed by helper: Right lower leg, Left lower leg     Bathing assist Assist Level: Moderate Assistance - Patient 50 - 74%     Upper Body Dressing/Undressing Upper body dressing   What is the patient wearing?: Pull over shirt    Upper body assist Assist Level: Supervision/Verbal cueing    Lower Body Dressing/Undressing Lower body dressing      What is the patient wearing?: Incontinence brief, Pants     Lower body assist Assist for lower body dressing: Maximal Assistance - Patient 25 - 49%     Toileting Toileting Toileting Activity did not occur (Clothing management and hygiene only): Refused  Toileting assist Assist for toileting: Moderate Assistance - Patient 50 - 74%     Transfers Chair/bed transfer  Transfers assist     Chair/bed transfer assist level: Moderate Assistance - Patient 50 - 74%     Locomotion Ambulation   Ambulation assist      Assist level: Maximal Assistance - Patient 25 - 49% Assistive device: Hand held assist Max distance: 5   Walk 10 feet activity   Assist  Walk 10 feet activity did not occur: Safety/medical concerns        Walk 50 feet activity   Assist Walk 50 feet with 2 turns activity did not occur:  Safety/medical concerns         Walk 150 feet activity   Assist Walk 150 feet activity did not occur: Safety/medical concerns         Walk 10 feet on uneven surface  activity   Assist Walk 10 feet on uneven surfaces activity did not occur: Safety/medical concerns         Wheelchair     Assist Will patient use wheelchair at discharge?: Yes Type of Wheelchair: Manual    Wheelchair assist level: Minimal Assistance - Patient > 75% Max wheelchair distance: 25    Wheelchair 50 feet with 2 turns activity    Assist        Assist Level: Moderate Assistance - Patient 50  - 74%   Wheelchair 150 feet activity     Assist      Assist Level: Total Assistance - Patient < 25%   Blood pressure 116/71, pulse 84, temperature 98.5 F (36.9 C), temperature source Oral, resp. rate 18, height 5' 4.5" (1.638 m), weight 53.8 kg, SpO2 96 %.   Medical Problem List and Plan: 1.  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  Antithrombotics: -DVT/anticoagulation: Lovenox  CBC within normal limits on 3/26             -antiplatelet therapy: Aspirin 325 mg daily and Plavix 75 mg daily x3 months then aspirin alone due to severe left ACA stenosis 3. Pain Management: Tylenol as needed 4. Mood/history of memory loss.  Remeron 7.5 mg nightly             -antipsychotic agents: N/A 5. Neuropsych: This patient is not capable of making decisions on her own behalf. 6. Skin/Wound Care: Routine skin checks  BMP within acceptable range on 3/26 7. Fluids/Electrolytes/Nutrition: Routine in and outs. 8.  Hyperlipidemia: Continue Lipitor 9.  Tobacco abuse.  NicoDerm patch.  Provide counseling 10. Incontinent of bowel and bladder?-   Bowels improving 11. Hx of urinary frequency- not sure of underlying cause- heard from daughter, however could have been just her smoke break based on hx.  PVRs ordered. 12.  History of hypertension: Not on any meds at present  Controlled on 3/27  LOS: 2 days A FACE TO FACE EVALUATION WAS PERFORMED  Yolanda Hopkins Yolanda Hopkins 08/07/2019, 5:55 PM

## 2019-08-07 NOTE — Plan of Care (Signed)
  Problem: Consults Goal: RH STROKE PATIENT EDUCATION Description: See Patient Education module for education specifics  Outcome: Progressing   Problem: RH BOWEL ELIMINATION Goal: RH STG MANAGE BOWEL WITH ASSISTANCE Description: STG Manage Bowel with min Assistance. Outcome: Progressing   Problem: RH BLADDER ELIMINATION Goal: RH STG MANAGE BLADDER WITH ASSISTANCE Description: STG Manage Bladder With min Assistance Outcome: Progressing   Problem: RH SAFETY Goal: RH STG ADHERE TO SAFETY PRECAUTIONS W/ASSISTANCE/DEVICE Description: STG Adhere to Safety Precautions With cues and reminders Outcome: Progressing   Problem: RH COGNITION-NURSING Goal: RH STG USES MEMORY AIDS/STRATEGIES W/ASSIST TO PROBLEM SOLVE Description: STG Uses Memory Aids/Strategies With min Assistance to Problem Solve. Outcome: Progressing   Problem: RH PAIN MANAGEMENT Goal: RH STG PAIN MANAGED AT OR BELOW PT'S PAIN GOAL Description: Pain level 3 on scale of 0-10 Outcome: Progressing   Problem: RH KNOWLEDGE DEFICIT Goal: RH STG INCREASE KNOWLEDGE OF HYPERTENSION Description: Pt will be able to adhere to medication regimen and dietary modification to better control blood pressure with min assist from caregiver.  Outcome: Progressing Goal: RH STG INCREASE KNOWLEDGE OF STROKE PROPHYLAXIS Description: Pt will be able to adhere to medication regimen and dietary modification to better prevent stroke with min assist from caregiver.  Outcome: Progressing   

## 2019-08-08 ENCOUNTER — Inpatient Hospital Stay (HOSPITAL_COMMUNITY): Payer: PRIVATE HEALTH INSURANCE | Admitting: Occupational Therapy

## 2019-08-08 DIAGNOSIS — R150 Incomplete defecation: Secondary | ICD-10-CM

## 2019-08-08 DIAGNOSIS — K5901 Slow transit constipation: Secondary | ICD-10-CM

## 2019-08-08 MED ORDER — SENNOSIDES-DOCUSATE SODIUM 8.6-50 MG PO TABS: 2.0000 | ORAL_TABLET | Freq: Every day | ORAL | Status: DC

## 2019-08-08 NOTE — Progress Notes (Signed)
Yolanda Hopkins PHYSICAL MEDICINE & REHABILITATION PROGRESS NOTE   Subjective/Complaints: Patient seen laying in bed this morning.  She slept well overnight, but remains sleepy.  No reported issues overnight.  Informed by nursing regarding need for disimpaction yesterday.  Review of systems: Limited due to cognition/lethargy.  Objective:   No results found. Recent Labs    08/05/19 1523 08/06/19 0707  WBC 6.2 6.8  HGB 15.1* 14.2  HCT 44.8 42.3  PLT 303 266   Recent Labs    08/05/19 1523 08/06/19 0707  NA  --  139  K  --  4.3  CL  --  105  CO2  --  24  GLUCOSE  --  96  BUN  --  28*  CREATININE 1.00 0.68  CALCIUM  --  9.4    Intake/Output Summary (Last 24 hours) at 08/08/2019 1302 Last data filed at 08/08/2019 0900 Gross per 24 hour  Intake 120 ml  Output --  Net 120 ml     Physical Exam: Vital Signs Blood pressure (!) 142/89, pulse 74, temperature 98.1 F (36.7 C), resp. rate 18, height 5' 4.5" (1.638 m), weight 53.8 kg, SpO2 99 %.  Constitutional: No distress . Vital signs reviewed. HENT: Normocephalic.  Atraumatic. Eyes: Keeps eyes closed. No discharge. Cardiovascular: No JVD. Respiratory: Normal effort.  No stridor. GI: Non-distended. Skin: Warm and dry.  Intact. Psych: Somnolent. Musc: No edema in extremities.  No tenderness in extremities. Neurologic: Somnolent Motor: 5/5 throughout, except for right lower extremity, which is 2+/5 (?  Effort),?  Unchanged  Assessment/Plan: 1. 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  Physiatrist is providing close team supervision and 24 hour management of active medical problems listed below.  Physiatrist and rehab team continue to assess barriers to discharge/monitor patient progress toward functional and medical goals  Care Tool:  Bathing    Body parts bathed by patient: Right arm, Left arm, Chest,  Abdomen, Front perineal area, Buttocks, Right upper leg, Left upper leg, Face   Body parts bathed by helper: Right lower leg, Left lower leg     Bathing assist Assist Level: Moderate Assistance - Patient 50 - 74%     Upper Body Dressing/Undressing Upper body dressing   What is the patient wearing?: Button up shirt    Upper body assist Assist Level: Supervision/Verbal cueing    Lower Body Dressing/Undressing Lower body dressing      What is the patient wearing?: Pants     Lower body assist Assist for lower body dressing: Moderate Assistance - Patient 50 - 74%     Toileting Toileting Toileting Activity did not occur Press photographer and hygiene only): Refused  Toileting assist Assist for toileting: Maximal Assistance - Patient 25 - 49%     Transfers Chair/bed transfer  Transfers assist     Chair/bed transfer assist level: Moderate Assistance - Patient 50 - 74%     Locomotion Ambulation   Ambulation assist      Assist level: Maximal Assistance - Patient 25 - 49% Assistive device: Hand held assist Max distance: 5   Walk 10 feet activity   Assist  Walk 10 feet activity did not occur: Safety/medical concerns        Walk 50 feet activity   Assist Walk 50 feet with 2 turns activity did not occur: Safety/medical concerns         Walk 150 feet activity  Assist Walk 150 feet activity did not occur: Safety/medical concerns         Walk 10 feet on uneven surface  activity   Assist Walk 10 feet on uneven surfaces activity did not occur: Safety/medical concerns         Wheelchair     Assist Will patient use wheelchair at discharge?: Yes Type of Wheelchair: Manual    Wheelchair assist level: Minimal Assistance - Patient > 75% Max wheelchair distance: 25    Wheelchair 50 feet with 2 turns activity    Assist        Assist Level: Moderate Assistance - Patient 50 - 74%   Wheelchair 150 feet activity     Assist       Assist Level: Total Assistance - Patient < 25%   Blood pressure (!) 142/89, pulse 74, temperature 98.1 F (36.7 C), resp. rate 18, height 5' 4.5" (1.638 m), weight 53.8 kg, SpO2 99 %.   Medical Problem List and Plan: 1.  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  Antithrombotics: -DVT/anticoagulation: Lovenox  CBC within normal limits on 3/26             -antiplatelet therapy: Aspirin 325 mg daily and Plavix 75 mg daily x3 months then aspirin alone due to severe left ACA stenosis 3. Pain Management: Tylenol as needed 4. Mood/history of memory loss.  Remeron 7.5 mg nightly             -antipsychotic agents: N/A 5. Neuropsych: This patient is not capable of making decisions on her own behalf. 6. Skin/Wound Care: Routine skin checks  BMP within acceptable range on 3/26 7. Fluids/Electrolytes/Nutrition: Routine in and outs. 8.  Hyperlipidemia: Continue Lipitor 9.  Tobacco abuse.  NicoDerm patch.  Provide counseling 10. Incontinent of bowel and bladder?-   Several bowel movements after disimpaction 11. Hx of urinary frequency- not sure of underlying cause- heard from daughter, however could have been just her smoke break based on hx.  PVRs with low residuals.  Inconsistent incontinence, will consider medications 12.  History of hypertension: Not on any meds at present  Controlled on 3/28  LOS: 3 days A FACE TO FACE EVALUATION WAS PERFORMED  Yolanda Hopkins 08/08/2019, 1:02 PM

## 2019-08-08 NOTE — Care Management (Signed)
[facility]patient [facility] Individual Statement of Services  Patient Name:  Yolanda Hopkins  Date:  08/08/2019  Welcome to the Inpatient Rehabilitation Center.  Our goal is to provide you with an individualized program based on your diagnosis and situation, designed to meet your specific needs.  With this comprehensive rehabilitation program, you will be expected to participate in at least 3 hours of rehabilitation therapies Monday-Friday, with modified therapy programming on the weekends.  Your rehabilitation program will include the following services:  Physical Therapy (PT), Occupational Therapy (OT), Speech Therapy (ST), 24 hour per day rehabilitation nursing, Neuropsychology, Case Management (Social Worker), Rehabilitation Medicine, Nutrition Services and Pharmacy Services  Weekly team conferences will be held on Wednesdays to discuss your progress.  Your Social Automotive engineer will talk with you frequently to get your input and to update you on team discussions.  Team conferences with you and your family in attendance may also be held.  Expected length of stay: 2-2.5 weeks  Overall anticipated outcome: Supervision - Minimal assistance  Depending on your progress and recovery, your program may change. Your Social Automotive engineer will coordinate services and will keep you informed of any changes. Your Social Worker's/Care Manager's name and contact numbers are listed  below.  The following services may also be recommended but are not provided by the Inpatient Rehabilitation Center:    Home Health Rehabiltiation Services  Outpatient Rehabilitation Services   Arrangements will be made to provide these services after discharge if needed.  Arrangements include referral to agencies that provide these services.  Your insurance has been verified to be:  Medicare Your primary doctor is:  [facility]  Pertinent information will be shared with your doctor and your  insurance company.  Care Manager/Social Worker:  Chana Bode, RN  [phone] or [phone]  Information discussed with and copy given to patient by: Pamelia Hoit, 08/08/2019, 11:15 AM

## 2019-08-08 NOTE — Plan of Care (Signed)
  Problem: Consults Goal: RH STROKE PATIENT EDUCATION Description: See Patient Education module for education specifics  Outcome: Progressing   Problem: RH BOWEL ELIMINATION Goal: RH STG MANAGE BOWEL WITH ASSISTANCE Description: STG Manage Bowel with min Assistance. Outcome: Progressing   Problem: RH BLADDER ELIMINATION Goal: RH STG MANAGE BLADDER WITH ASSISTANCE Description: STG Manage Bladder With min Assistance Outcome: Progressing   Problem: RH SAFETY Goal: RH STG ADHERE TO SAFETY PRECAUTIONS W/ASSISTANCE/DEVICE Description: STG Adhere to Safety Precautions With cues and reminders Outcome: Progressing   Problem: RH COGNITION-NURSING Goal: RH STG USES MEMORY AIDS/STRATEGIES W/ASSIST TO PROBLEM SOLVE Description: STG Uses Memory Aids/Strategies With min Assistance to Problem Solve. Outcome: Progressing   Problem: RH PAIN MANAGEMENT Goal: RH STG PAIN MANAGED AT OR BELOW PT'S PAIN GOAL Description: Pain level 3 on scale of 0-10 Outcome: Progressing   Problem: RH KNOWLEDGE DEFICIT Goal: RH STG INCREASE KNOWLEDGE OF HYPERTENSION Description: Pt will be able to adhere to medication regimen and dietary modification to better control blood pressure with min assist from caregiver.  Outcome: Progressing Goal: RH STG INCREASE KNOWLEDGE OF STROKE PROPHYLAXIS Description: Pt will be able to adhere to medication regimen and dietary modification to better prevent stroke with min assist from caregiver.  Outcome: Progressing   

## 2019-08-09 ENCOUNTER — Inpatient Hospital Stay (HOSPITAL_COMMUNITY): Payer: PRIVATE HEALTH INSURANCE | Admitting: Physical Therapy

## 2019-08-09 ENCOUNTER — Encounter (HOSPITAL_COMMUNITY): Payer: PRIVATE HEALTH INSURANCE | Admitting: Psychology

## 2019-08-09 ENCOUNTER — Inpatient Hospital Stay (HOSPITAL_COMMUNITY): Payer: PRIVATE HEALTH INSURANCE | Admitting: Speech Pathology

## 2019-08-09 ENCOUNTER — Inpatient Hospital Stay (HOSPITAL_COMMUNITY): Payer: PRIVATE HEALTH INSURANCE | Admitting: Occupational Therapy

## 2019-08-09 ENCOUNTER — Inpatient Hospital Stay (HOSPITAL_COMMUNITY): Payer: Medicare Other

## 2019-08-09 LAB — GLUCOSE, CAPILLARY: Glucose-Capillary: 94 mg/dL (ref 70–99)

## 2019-08-09 MED ORDER — MIRTAZAPINE 15 MG PO TABS
15.0000 mg | ORAL_TABLET | Freq: Every day | ORAL | Status: DC
  Administered 2019-08-09 – 2019-08-19 (×11): 15 mg via ORAL
  Filled 2019-08-09 (×11): qty 1

## 2019-08-09 NOTE — Consult Note (Signed)
Neuropsychological Consultation   Patient:   Yolanda Hopkins   DOB:   1941-04-29  MR Number:  [id number]  Location:  [facility] [address] CENTER A [address] [id number] [place] [address] Dept: [phone] Loc: [phone]           Date of Service:   08/09/2019  Start Time:   10 AM End Time:   11 AM  Provider/Observer:  Arley Phenix, Psy.D.       Clinical Neuropsychologist       Billing Code/Service: [phone]  Chief Complaint:    Yolanda Hopkins is an 79 year old right-handed female with history of hypertension, depression, hyperlipidemia, tobacco abuse and vertigo.  The patient presented on 07/31/2019 with right-sided weakness as well as falls over the previous 2 days and altered mental status.  MRI of brain showed multiple small acute infarcts within the left frontal white matter and left anterior cerebral artery territory.  Multiple old small vessel infarcts of the basal ganglia and cerebellum were noted.  The patient has severe left ACA stenosis.  Patient was admitted to the comprehensive rehabilitation program due to ongoing motor deficits and residual issues.  Reason for Service:  Patient was referred for neuropsychological consultation due to flatness of affect and reduction of engagement and potential coping issues and a setting of someone with a history of depression.  Below is the HPI for the current admission.  HPI: Yolanda Hopkins is an 79 year old right-handed female with history of hypertension, depression, hyperlipidemia tobacco abuse and vertigo.  Presented 07/31/2019 with right side weakness as well as falls x2 with altered mental status.  Cranial CT scan negative for acute changes.  Patient did not receive TPA.  MRI of the brain showed multiple small acute infarcts within the left frontal white matter, and the left anterior cerebral artery territory.  Multiple old small vessel infarcts of the basal ganglia  and cerebellum.  CT angiogram of head and neck no intracranial arterial occlusion or high-grade stenosis.  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  Echocardiogram with ejection fraction of 60%.  Admission chemistries unremarkable, SARS coronavirus negative.  Neurology follow-up maintained on aspirin 325 mg and Plavix for CVA prophylaxis x3 months then aspirin alone due to severe left ACA stenosis.  Subcutaneous Lovenox for DVT prophylaxis.  Therapy evaluations completed and patient was admitted for a comprehensive rehab program.  Current Status:  The patient with extremely flat affect today, which appears to be her typical behavioral pattern.  The patient does have a history of depression and is continuing to take Remeron, which was given prior to admission.  While the patient reports that she understands what is happening and does understand where she is there was very little spontaneous expressive output by the patient.  It is hard to get a good gauge of her awareness and mental status.  Behavioral Observation: [name]  presents as a 79 y.o.-year-old Right Caucasian Female who appeared her stated age. her dress was Appropriate and she was Well Groomed and her manners were Appropriate to the situation.  her participation was indicative of Appropriate and Inattentive behaviors.  There were any physical disabilities noted.  she displayed an appropriate level of cooperation and motivation.     Interactions:    Minimal Redirectable  Attention:   abnormal and attention span appeared shorter than expected for age  Memory:   abnormal; remote memory intact, recent memory impaired  Visuo-spatial:  not examined  Speech (Volume):  low  Speech:   normal; slowed response style and very little verbal production and delayed verbal fluency.  Thought Process:  Coherent and Circumstantial  Though Content:  WNL; not suicidal and not homicidal  Orientation:   person and  place  Judgment:   Poor  Planning:   Poor  Affect:    Blunted, Flat and Lethargic  Mood:    Dysphoric  Insight:   Shallow  Intelligence:   normal  Medical History:   Past Medical History:  Diagnosis Date  . Hypertension   . Tobacco abuse 08/19/2013  . Vertigo     Psychiatric History:  The patient does have a prior history of depression and has been treated with Remeron at night at bedtime to facilitate sleep as well.  Family Med/Psych History:  Family History  Problem Relation Age of Onset  . Hypertension Mother   . Hypertension Father   . Liver cancer Father    Impression/DX:  Yolanda Hopkins is an 79 year old right-handed female with history of hypertension, depression, hyperlipidemia, tobacco abuse and vertigo.  The patient presented on 07/31/2019 with right-sided weakness as well as falls over the previous 2 days and altered mental status.  MRI of brain showed multiple small acute infarcts within the left frontal white matter and left anterior cerebral artery territory.  Multiple old small vessel infarcts of the basal ganglia and cerebellum were noted.  The patient has severe left ACA stenosis.  Patient was admitted to the comprehensive rehabilitation program due to ongoing motor deficits and residual issues.  The patient with extremely flat affect today, which appears to be her typical behavioral pattern.  The patient does have a history of depression and is continuing to take Remeron, which was given prior to admission.  While the patient reports that she understands what is happening and does understand where she is there was very little spontaneous expressive output by the patient.  It is hard to get a good gauge of her awareness and mental status.  Disposition/Plan:  Today we worked on coping and adjustment issues.  The patient denied any exacerbation of her depression and she reports that it remains at the level it was prior to her most recent stroke.  She does report that  she has had a significant worsening of her motor function.  I want to increase her Remeron back to the level she was taking prior to admission to see if this helps improve some of her affect and engagement.  Diagnosis:    Acute ischemic left ACA stroke [facility]) - Plan: Ambulatory referral to Neurology         Electronically Signed   _______________________ Ilean Skill, Psy.D.

## 2019-08-09 NOTE — Care Management (Signed)
   Patient Details  Name: Yolanda Hopkins MRN: [id number] Date of Birth: 1941/04/12  Today's Date: 08/09/2019  Problem List:  Patient Active Problem List   Diagnosis Date Noted  . Incomplete defecation   . Slow transit constipation   . History of hypertension   . Urinary frequency   . Urinary incontinence   . Acute CVA (cerebrovascular accident) (HCC) 07/30/2019  . GI bleed 05/22/2016  . Pressure injury of skin 05/22/2016  . Loss of weight   . Dyslipidemia   . 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 HTN (hypertension) 08/19/2013  . Tobacco abuse 08/19/2013  . Vertigo 08/19/2013  . Gait abnormality 08/19/2013  . CVA (cerebral infarction) 08/19/2013   Past Medical History:  Past Medical History:  Diagnosis Date  . Hypertension   . Tobacco abuse 08/19/2013  . Vertigo    Past Surgical History:  Past Surgical History:  Procedure Laterality Date  . COLONOSCOPY N/A 05/23/2016   Procedure: COLONOSCOPY;  Surgeon: Jeani Hawking, MD;  Location: [facility] ENDOSCOPY;  Service: Endoscopy;  Laterality: N/A;  . ESOPHAGOGASTRODUODENOSCOPY N/A 05/23/2016   Procedure: ESOPHAGOGASTRODUODENOSCOPY (EGD);  Surgeon: Jeani Hawking, MD;  Location: [facility] ENDOSCOPY;  Service: Endoscopy;  Laterality: N/A;  . GIVENS CAPSULE STUDY N/A 05/23/2016   Procedure: GIVENS CAPSULE STUDY;  Surgeon: Jeani Hawking, MD;  Location: [facility] ENDOSCOPY;  Service: Endoscopy;  Laterality: N/A;   Social History:  reports that she has been smoking cigarettes. She has never used smokeless tobacco. She reports that she does not drink alcohol or use drugs.  Family / Support Systems Marital Status: Widow/Widower Patient Roles: Parent Children: Three children Anticipated Caregiver: Family Cristal Deer, Glennon Hamilton + hired caregiver aide) Ability/Limitations of Caregiver: Min A Caregiver Availability: 24/7  Social History Preferred language: English Religion: Baptist Read: Yes Write: Yes Employment Status: Retired    Abuse/Neglect Abuse/Neglect Assessment Can Be Completed: Yes Physical Abuse: Denies Verbal Abuse: Yes, past (Comment)(in the past per pt. not at risk at this time) Sexual Abuse: Denies Exploitation of patient/patient's resources: Denies Self-Neglect: Denies  Emotional Status Pt's affect, behavior and adjustment status: Flat affect, normal mood and behavior Recent Psychosocial Issues: Depression Psychiatric History: PTSD  Patient / Family Perceptions, Expectations & Goals Pt/Family understanding of illness & functional limitations: Patient is not aware of current health issues; does not she had a stroke Anticipated changes in roles/activities/participation: Will need assistance at discharge Pt/family expectations/goals: Son expects patient to return home with hired care givers providing care M-F as PTA; willing to hire more care for night time if needed  [employer] available at discharge: Son will provide transportation at discharge Resource referrals recommended: Neuropsychology  Discharge Planning Living Arrangements: Children Support Systems: Children, Other relatives Type of Residence: Private residence Insurance Resources: Medicare Money Management: Family Does the patient have any problems obtaining your medications?: No Home Management: Family will manage the home [place] Barriers to Discharge: Decreased caregiver support Social Work Anticipated Follow Up Needs: HH/OP Expected length of stay: 10-14 days  Clinical Impression Patient is alert but poor memory impaired our conversation. Son reports he has hired caregivers M-F during the day and is willing to hire help to cover the night if needed. Would like for the patient to return home and be able to be cared for by hired caregivers as PTA. Patient reported PCP as previous MD, Son clarified new PCP is Dr. Milinda Cave.  Chana Bode B 08/09/2019, 9:24 AM

## 2019-08-09 NOTE — Plan of Care (Signed)
  Problem: Consults Goal: RH STROKE PATIENT EDUCATION Description: See Patient Education module for education specifics  Outcome: Progressing   Problem: RH BOWEL ELIMINATION Goal: RH STG MANAGE BOWEL WITH ASSISTANCE Description: STG Manage Bowel with min Assistance. Outcome: Progressing   Problem: RH BLADDER ELIMINATION Goal: RH STG MANAGE BLADDER WITH ASSISTANCE Description: STG Manage Bladder With min Assistance Outcome: Progressing   Problem: RH SAFETY Goal: RH STG ADHERE TO SAFETY PRECAUTIONS W/ASSISTANCE/DEVICE Description: STG Adhere to Safety Precautions With cues and reminders Outcome: Progressing   Problem: RH COGNITION-NURSING Goal: RH STG USES MEMORY AIDS/STRATEGIES W/ASSIST TO PROBLEM SOLVE Description: STG Uses Memory Aids/Strategies With min Assistance to Problem Solve. Outcome: Progressing   Problem: RH PAIN MANAGEMENT Goal: RH STG PAIN MANAGED AT OR BELOW PT'S PAIN GOAL Description: Pain level 3 on scale of 0-10 Outcome: Progressing   Problem: RH KNOWLEDGE DEFICIT Goal: RH STG INCREASE KNOWLEDGE OF HYPERTENSION Description: Pt will be able to adhere to medication regimen and dietary modification to better control blood pressure with min assist from caregiver.  Outcome: Progressing Goal: RH STG INCREASE KNOWLEDGE OF STROKE PROPHYLAXIS Description: Pt will be able to adhere to medication regimen and dietary modification to better prevent stroke with min assist from caregiver.  Outcome: Progressing   

## 2019-08-09 NOTE — Progress Notes (Signed)
Speech Language Pathology Daily Session Note  Patient Details  Name: Yolanda Hopkins MRN: [id number] Date of Birth: Jul 04, 1940  Today's Date: 08/09/2019 SLP Individual Time: 1330-1413 SLP Individual Time Calculation (min): 43 min  Short Term Goals: Week 1: SLP Short Term Goal 1 (Week 1): Pt will utilize external memory aids to recall orientiaotn information in 5 out ot 100 oportunities with Mod A cues. SLP Short Term Goal 2 (Week 1): Pt will utilize speech intelligibility strategies at the phrase level to increase speech inteligiblity to ~ 75% with Mod A cues. SLP Short Term Goal 3 (Week 1): Pt will utilize word finding strategies to increase fluency of speech at the phrase level with Max A cues. SLP Short Term Goal 4 (Week 1): Pt will copmlete basic familiar problem solving tasks with Mod A cues. SLP Short Term Goal 5 (Week 1): Patient will consume current diet without overt s/s of aspiration and supervision level verbal cues for use of swallowing compensatory strategies.  Skilled Therapeutic Interventions: Pt was seen for skilled ST targeting cognitive goals. Total A was required for pt to use a monthly calendar to orient to date, as well as orientation to place and situation. Pt was very difficult to engage in any activity today, and told SLP to "go to hell" several times throughout session and intermittently began banging her hair brush on the table. SLP attempted to engage pt in conversation regarding personally relevant and familiar topics, provide active listening and help pt identify any potential pain or source of emotional distress, however pt denied these opportunities. Pt able to sort coins by with 80% accuracy when provided Max A for task initiation and problem solving, however Total A required to count set amounts of change. Although pt stated she knew how to use call bell to request assistance, she was unable to successfully demonstrate or return demonstration after teaching. Pt left  sitting in wheelchair with seatbelt alarm in place and needs met. Continue per current plan of care.        Pain Pain Assessment Pain Scale: Faces Faces Pain Scale: No hurt  Therapy/Group: Individual Therapy  Arbutus Leas 08/09/2019, 7:27 AM

## 2019-08-09 NOTE — Progress Notes (Signed)
Occupational Therapy Session Note  Patient Details  Name: Yolanda Hopkins MRN: [id number] Date of Birth: 07/04/1940  Today's Date: 08/09/2019 OT Individual Time: 0700-0725 OT Individual Time Calculation (min): 25 min    Short Term Goals: Week 1:  OT Short Term Goal 1 (Week 1): Pt will complete 1/3 components of toileting with Mod balance assist OT Short Term Goal 2 (Week 1): Pt will complete UB dressing with Min A OT Short Term Goal 3 (Week 1): Pt will complete LB dressing with Mod A and LRAD  Skilled Therapeutic Interventions/Progress Updates:    Upon entering the room, pt supine in bed and sleeping soundly. Multiple attempts made to awaken pt for OT participation. Pt opening eyes and speaking to therapist and verbalizing no pain. OT suggested session started with assisting pt to bathroom and pt remains in chair for breakfast. Pt is agreeable. OT obtains breakfast tray and needed items. OT needing to awaken pt from sleep again in which she then refuses OOB activities and requests to remain in bed to sleep further. OT attempts to encourage pt but she declines. Call bell within reach and bed alarm activated.   Therapy Documentation Precautions:  Precautions Precautions: Fall Restrictions Weight Bearing Restrictions: No General: General OT Amount of Missed Time: 50 Minutes Vital Signs: Therapy Vitals Temp: 98.1 F (36.7 C) Pulse Rate: 66 Resp: 16 BP: 134/67 Patient Position (if appropriate): Lying Oxygen Therapy SpO2: 98 % O2 Device: Room Air Pain:   ADL: ADL Eating: Not assessed Grooming: Moderate assistance Where Assessed-Grooming: Standing at sink Upper Body Bathing: Supervision/safety Where Assessed-Upper Body Bathing: Sitting at sink Lower Body Bathing: Moderate assistance Where Assessed-Lower Body Bathing: Standing at sink, Sitting at sink Upper Body Dressing: Moderate assistance Where Assessed-Upper Body Dressing: Sitting at sink Lower Body Dressing: Maximal  assistance Where Assessed-Lower Body Dressing: Standing at sink, Sitting at sink Toileting: Not assessed Toilet Transfer: Moderate assistance Toilet Transfer Method: Stand pivot Toilet Transfer Equipment: Grab bars Tub/Shower Transfer: Not assessed   Therapy/Group: Individual Therapy  Alen Bleacher 08/09/2019, 7:34 AM

## 2019-08-09 NOTE — Progress Notes (Signed)
Occupational Therapy Session Note  Patient Details  Name: Yolanda Hopkins MRN: [id number] Date of Birth: February 24, 1941  Today's Date: 08/09/2019 OT Individual Time: 4259-5638 OT Individual Time Calculation (min): 43 min   Short Term Goals: Week 1:  OT Short Term Goal 1 (Week 1): Pt will complete 1/3 components of toileting with Mod balance assist OT Short Term Goal 2 (Week 1): Pt will complete UB dressing with Min A OT Short Term Goal 3 (Week 1): Pt will complete LB dressing with Mod A and LRAD  Skilled Therapeutic Interventions/Progress Updates:    Pt greeted in the w/c, denying pain but visibly agitated. She declined attempting to toilet or engage in oral care at the sink. Set pt up with a seated therapeutic activity (zoom ball) and she threw the item on the floor. Min A for sit<stand at the sink with pt pulling up on the sink, swearing while attempting to motor plan when washing her face, glasses, and hands. She completed stated tasks with vcs. Pt was agreeable to lie down to rest afterwards. Mod A for power up using RW, then Min A for ambulating towards the bed. Note that when she was halfway to bed pt abandoned device, then straightened her chuck pad and pillowcases several times. Pt refused to lie down in bed. Pt then ambulated back to her w/c placed at the sink, once again abandoning her walker and requiring Mod A for balance during ambulation. Mod A for stand pivot<recliner where she was set up for comfort. Pt slapping OT's hands when attempting to don safety belt. Active listening used for de-escalation with pt stating she felt "hurt." When asked if this was a hurt that she needed pain medicine for, or a different kind of hurt, pt stated "a different kind of hurt." Provided pt with emotional support at this time. After some time, she was willing to don the safety belt. When the sensor was activated the alarm accidentally went off, and pt pulled the belt off and refused to don it again. Mod A for  stand pivot<bed, where pt once again busied herself with organizing the bed linen. Increased time and calming cues required before pt sat down and transitioned to supine. Left pt with all needs within reach and bed alarm set.   Therapy Documentation Precautions:  Precautions Precautions: Fall Restrictions Weight Bearing Restrictions: No Vital Signs: Therapy Vitals Temp: 98.1 F (36.7 C) Pulse Rate: 70 Resp: 17 BP: 108/61 Patient Position (if appropriate): Sitting Oxygen Therapy SpO2: 100 % O2 Device: Room Air ADL: ADL Eating: Not assessed Grooming: Moderate assistance Where Assessed-Grooming: Standing at sink Upper Body Bathing: Supervision/safety Where Assessed-Upper Body Bathing: Sitting at sink Lower Body Bathing: Moderate assistance Where Assessed-Lower Body Bathing: Standing at sink, Sitting at sink Upper Body Dressing: Moderate assistance Where Assessed-Upper Body Dressing: Sitting at sink Lower Body Dressing: Maximal assistance Where Assessed-Lower Body Dressing: Standing at sink, Sitting at sink Toileting: Not assessed Toilet Transfer: Moderate assistance Toilet Transfer Method: Stand pivot Toilet Transfer Equipment: Grab bars Tub/Shower Transfer: Not assessed     Therapy/Group: Individual Therapy  Dusty Wagoner A Nare Gaspari 08/09/2019, 3:19 PM

## 2019-08-09 NOTE — Progress Notes (Signed)
Physical Therapy Session Note  Patient Details  Name: IZZABELL KLASEN MRN: [id number] Date of Birth: [birth year]  Today's Date: 08/09/2019 PT Individual Time: 0907-1000 PT Individual Time Calculation (min): 53 min   Short Term Goals: Week 1:  PT Short Term Goal 1 (Week 1): Pt will transfer to [facility] with min assist and LRAD PT Short Term Goal 2 (Week 1): Pt will ambulate 12f with mod assist and LRAD PT Short Term Goal 3 (Week 1): Pt will perform bed mobility with min assist PT Short Term Goal 4 (Week 1): Pt will propel WC 74fwith min assist  Skilled Therapeutic Interventions/Progress Updates: Pt presented in bed agreeable to therapy. Pt denies pain during session. Performed supine to sit at EOB with modA and max cues for sequencing. Required modA and use of draw sheet to come to EOB. Pt noted to be incontinent of bowel. Performed STS in [facility] pt performing peri-care total A. PTA threaded brief/pants total A for time management. Pt required moderate multimodal cues to maintain midline. Transferred to w/c via [facility]. Pt transported to day room and participated in Cybex Kinetron 90cm/sec 10cycles x 3 then 30 sec for reciprocal activity and sustained task. Participated in gait training x 1530fith modA and RW with cues for RW safety and cues to increase step length with RLE. Pt returned to room at end of session and agreeable to remain in w/c with belt alarm on, call bell within reach and needs met.      Therapy Documentation Precautions:  Precautions Precautions: Fall Restrictions Weight Bearing Restrictions: No    Therapy/Group: Individual Therapy  Rondy Krupinski  Janashia Parco, PTA  08/09/2019, 12:52 PM

## 2019-08-09 NOTE — Progress Notes (Signed)
Pt refused hospital gown after therapy

## 2019-08-09 NOTE — Progress Notes (Signed)
[facility] PHYSICAL MEDICINE & REHABILITATION PROGRESS NOTE   Subjective/Complaints:  Awake and alert sitting in a wheelchair.  Affect is flat.  Discussed with neuropsychology.  Reviewed home meds versus current medication list Review of systems: Limited due to cognition/lethargy.  Objective:   No results found. No results for input(s): WBC, HGB, HCT, PLT in the last 72 hours. No results for input(s): NA, K, CL, CO2, GLUCOSE, BUN, CREATININE, CALCIUM in the last 72 hours.  Intake/Output Summary (Last 24 hours) at 08/09/2019 1011 Last data filed at 08/09/2019 0839 Gross per 24 hour  Intake 420 ml  Output --  Net 420 ml     Physical Exam: Vital Signs Blood pressure 134/67, pulse 66, temperature 98.1 F (36.7 C), resp. rate 16, height 5' 4.5" (1.638 m), weight 53.8 kg, SpO2 98 %.    General: No acute distress Mood and affect are appropriate Heart: Regular rate and rhythm no rubs murmurs or extra sounds Lungs: Clear to auscultation, breathing unlabored, no rales or wheezes Abdomen: Positive bowel sounds, soft nontender to palpation, nondistended Extremities: No clubbing, cyanosis, or edema Skin: No evidence of breakdown, no evidence of rash Neurologic: Cranial nerves II through XII intact, motor strength   Motor: 5/5 throughout, except for right lower extremity, which is 3+/5  Assessment/Plan: 1. 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  Physiatrist is providing close team supervision and 24 hour management of active medical problems listed below.  Physiatrist and rehab team continue to assess barriers to discharge/monitor patient progress toward functional and medical goals  Care Tool:  Bathing    Body parts bathed by patient: Right arm, Left arm, Chest, Abdomen, Front perineal area, Buttocks, Right upper leg, Left upper leg, Face   Body parts bathed by  helper: Right lower leg, Left lower leg     Bathing assist Assist Level: Moderate Assistance - Patient 50 - 74%     Upper Body Dressing/Undressing Upper body dressing   What is the patient wearing?: Button up shirt    Upper body assist Assist Level: Supervision/Verbal cueing    Lower Body Dressing/Undressing Lower body dressing      What is the patient wearing?: Pants     Lower body assist Assist for lower body dressing: Moderate Assistance - Patient 50 - 74%     Toileting Toileting Toileting Activity did not occur Landscape architect and hygiene only): Refused  Toileting assist Assist for toileting: Moderate Assistance - Patient 50 - 74%     Transfers Chair/bed transfer  Transfers assist     Chair/bed transfer assist level: Moderate Assistance - Patient 50 - 74%     Locomotion Ambulation   Ambulation assist      Assist level: Maximal Assistance - Patient 25 - 49% Assistive device: Hand held assist Max distance: 5   Walk 10 feet activity   Assist  Walk 10 feet activity did not occur: Safety/medical concerns        Walk 50 feet activity   Assist Walk 50 feet with 2 turns activity did not occur: Safety/medical concerns         Walk 150 feet activity   Assist Walk 150 feet activity did not occur: Safety/medical concerns         Walk 10 feet on uneven surface  activity   Assist Walk 10 feet on uneven surfaces activity did not occur: Safety/medical concerns  Wheelchair     Assist Will patient use wheelchair at discharge?: Yes Type of Wheelchair: Manual    Wheelchair assist level: Minimal Assistance - Patient > 75% Max wheelchair distance: 25    Wheelchair 50 feet with 2 turns activity    Assist        Assist Level: Moderate Assistance - Patient 50 - 74%   Wheelchair 150 feet activity     Assist      Assist Level: Total Assistance - Patient < 25%   Blood pressure 134/67, pulse 66, temperature  98.1 F (36.7 C), resp. rate 16, height 5' 4.5" (1.638 m), weight 53.8 kg, SpO2 98 %.   Medical Problem List and Plan: 1.  Right side weakness secondary to left ACA scattered infarcts in the left frontal lobe  Continue CIR PT OT speech 2.  Antithrombotics: -DVT/anticoagulation: Lovenox  CBC within normal limits on 3/26             -antiplatelet therapy: Aspirin 325 mg daily and Plavix 75 mg daily x3 months then aspirin alone due to severe left ACA stenosis 3. Pain Management: Tylenol as needed 4. Mood/history of memory loss.  Remeron 7.5 mg nightly, increase to 15 mg             -antipsychotic agents: N/A 5. Neuropsych: This patient is not capable of making decisions on her own behalf. 6. Skin/Wound Care: Routine skin checks  BMP within acceptable range on 3/26 7. Fluids/Electrolytes/Nutrition: Routine in and outs. 8.  Hyperlipidemia: Continue Lipitor 9.  Tobacco abuse.  NicoDerm patch.  Provide counseling 10. Incontinent of bowel and bladder?-   Several bowel movements after disimpaction 11. Hx of urinary frequency- not sure of underlying cause- heard from daughter, however could have been just her smoke break based on hx.  PVRs with low residuals.  Inconsistent incontinence, will consider medications, this is likely due to her cognition and may be difficult to treat with medication management 12.  History of hypertension: Not on any meds at present  Controlled Vitals:   08/08/19 2029 08/09/19 0344  BP: 137/68 134/67  Pulse: 81 66  Resp: 20 16  Temp: 98.9 F (37.2 C) 98.1 F (36.7 C)  SpO2: 97% 98%    LOS: 4 days A FACE TO FACE EVALUATION WAS PERFORMED  Erick Colace 08/09/2019, 10:11 AM

## 2019-08-09 NOTE — Progress Notes (Signed)
Pt was found by another nurse on backside a the foot of the bed @8 :15. Pt states that she was trying to get to the bathroom when she fell. Pt states that she has pain in her head was 1 on a 1-10 scale. Pt was A&Ox2-3  Oriented to place and time. @9 :64  Doctor on call was notified about the fall, and ordered a stat CT scan. @10 :30 Pt family was notified and had multiple concerns. Told them that I would notify the doctor and have the doctor answer most of them. Family concerned that she may have had another stroke and that was why she was on the floor. This explained to the family that nothing showed on the CT. Requested that an MRI be done. Talked to on call doctor and they requested Dr. 44 make that decision in the morning .

## 2019-08-09 NOTE — Plan of Care (Signed)
  Problem: Consults Goal: RH STROKE PATIENT EDUCATION Description: See Patient Education module for education specifics  Outcome: Progressing   Problem: RH BOWEL ELIMINATION Goal: RH STG MANAGE BOWEL WITH ASSISTANCE Description: STG Manage Bowel with min Assistance. Outcome: Progressing   Problem: RH BLADDER ELIMINATION Goal: RH STG MANAGE BLADDER WITH ASSISTANCE Description: STG Manage Bladder With min Assistance Outcome: Progressing   Problem: RH SAFETY Goal: RH STG ADHERE TO SAFETY PRECAUTIONS W/ASSISTANCE/DEVICE Description: STG Adhere to Safety Precautions With cues and reminders Outcome: Progressing   Problem: RH COGNITION-NURSING Goal: RH STG USES MEMORY AIDS/STRATEGIES W/ASSIST TO PROBLEM SOLVE Description: STG Uses Memory Aids/Strategies With min Assistance to Problem Solve. Outcome: Progressing   Problem: RH PAIN MANAGEMENT Goal: RH STG PAIN MANAGED AT OR BELOW PT'S PAIN GOAL Description: Pain level 3 on scale of 0-10 Outcome: Progressing   Problem: RH KNOWLEDGE DEFICIT Goal: RH STG INCREASE KNOWLEDGE OF HYPERTENSION Description: Pt will be able to adhere to medication regimen and dietary modification to better control blood pressure with min assist from caregiver.  Outcome: Progressing Goal: RH STG INCREASE KNOWLEDGE OF STROKE PROPHYLAXIS Description: Pt will be able to adhere to medication regimen and dietary modification to better prevent stroke with min assist from caregiver.  Outcome: Progressing

## 2019-08-10 ENCOUNTER — Inpatient Hospital Stay (HOSPITAL_COMMUNITY): Payer: PRIVATE HEALTH INSURANCE | Admitting: Physical Therapy

## 2019-08-10 ENCOUNTER — Inpatient Hospital Stay (HOSPITAL_COMMUNITY): Payer: PRIVATE HEALTH INSURANCE | Admitting: Speech Pathology

## 2019-08-10 ENCOUNTER — Inpatient Hospital Stay (HOSPITAL_COMMUNITY): Payer: PRIVATE HEALTH INSURANCE | Admitting: Occupational Therapy

## 2019-08-10 NOTE — Progress Notes (Signed)
Occupational Therapy Session Note  Patient Details  Name: Yolanda Hopkins MRN: [id number] Date of Birth: 1940-12-29  Today's Date: 08/10/2019 OT Individual Time: 0705-0800 OT Individual Time Calculation (min): 55 min    Short Term Goals: Week 1:  OT Short Term Goal 1 (Week 1): Pt will complete 1/3 components of toileting with Mod balance assist OT Short Term Goal 2 (Week 1): Pt will complete UB dressing with Min A OT Short Term Goal 3 (Week 1): Pt will complete LB dressing with Mod A and LRAD  Skilled Therapeutic Interventions/Progress Updates:    Upon entering the room, pt supine in bed and agreeable to OT intervention. Pt performing supine >sit with min A. Stand min A with RW and transfer into wheelchair with mod A. Pt requesting to go to bathroom and therapist propelled wheelchair into bathroom and pt transferred onto commode with mod A and min A stand balance for clothing management. Pt able to void and performs hygiene while seated. Pt dressing from commode this session with set up A for UB and mod A for LB dressing. Pt returning back to wheelchair and needing min - mod cuing for sequencing to wash hands at sink. Pt seated in wheelchair with breakfast tray in front of her and needing set up A to cut sausage and open containers. Pt needing min cuing to clear cheeks of food and swallow what was in mouth before taking another bite. Pt does not remember fall last night and does not report any pain this session. Chair alarm belt donned and call bell within reach as pt remains in wheelchair.   Therapy Documentation Precautions:  Precautions Precautions: Fall Restrictions Weight Bearing Restrictions: No  Pain: Pain Assessment Pain Scale: 0-10 Pain Score: 0-No pain ADL: ADL Eating: Not assessed Grooming: Moderate assistance Where Assessed-Grooming: Standing at sink Upper Body Bathing: Supervision/safety Where Assessed-Upper Body Bathing: Sitting at sink Lower Body Bathing: Moderate  assistance Where Assessed-Lower Body Bathing: Standing at sink, Sitting at sink Upper Body Dressing: Moderate assistance Where Assessed-Upper Body Dressing: Sitting at sink Lower Body Dressing: Maximal assistance Where Assessed-Lower Body Dressing: Standing at sink, Sitting at sink Toileting: Not assessed Toilet Transfer: Moderate assistance Toilet Transfer Method: Stand pivot Toilet Transfer Equipment: Grab bars Tub/Shower Transfer: Not assessed   Therapy/Group: Individual Therapy  Alen Bleacher 08/10/2019, 12:01 PM

## 2019-08-10 NOTE — Plan of Care (Signed)
  Problem: Consults Goal: RH STROKE PATIENT EDUCATION Description: See Patient Education module for education specifics  Outcome: Progressing   Problem: RH BOWEL ELIMINATION Goal: RH STG MANAGE BOWEL WITH ASSISTANCE Description: STG Manage Bowel with min Assistance. Outcome: Progressing   Problem: RH BLADDER ELIMINATION Goal: RH STG MANAGE BLADDER WITH ASSISTANCE Description: STG Manage Bladder With min Assistance Outcome: Progressing   Problem: RH SAFETY Goal: RH STG ADHERE TO SAFETY PRECAUTIONS W/ASSISTANCE/DEVICE Description: STG Adhere to Safety Precautions With cues and reminders Outcome: Progressing   Problem: RH COGNITION-NURSING Goal: RH STG USES MEMORY AIDS/STRATEGIES W/ASSIST TO PROBLEM SOLVE Description: STG Uses Memory Aids/Strategies With min Assistance to Problem Solve. Outcome: Progressing   Problem: RH PAIN MANAGEMENT Goal: RH STG PAIN MANAGED AT OR BELOW PT'S PAIN GOAL Description: Pain level 3 on scale of 0-10 Outcome: Progressing   Problem: RH KNOWLEDGE DEFICIT Goal: RH STG INCREASE KNOWLEDGE OF HYPERTENSION Description: Pt will be able to adhere to medication regimen and dietary modification to better control blood pressure with min assist from caregiver.  Outcome: Progressing Goal: RH STG INCREASE KNOWLEDGE OF STROKE PROPHYLAXIS Description: Pt will be able to adhere to medication regimen and dietary modification to better prevent stroke with min assist from caregiver.  Outcome: Progressing   

## 2019-08-10 NOTE — Progress Notes (Signed)
   08/09/19 2100  What Happened  Was fall witnessed? No  Was patient injured? Yes (pt has a knot on back of head)  Patient found on floor  Found by Staff-comment (Aarron RN)  Stated prior activity ambulating-unassisted  Follow Up  MD notified Eurince Thomas   Time MD notified 2150  Family notified Yes - comment  Time family notified 2200  Additional tests Yes-comment (CT Scan)  Progress note created (see row info) Yes  Adult Fall Risk Assessment  Risk Factor Category (scoring not indicated) History of more than one fall within 6 months before admission (document High fall risk)  Age 79  Fall History: Fall within 6 months prior to admission 5  Elimination; Bowel and/or Urine Incontinence 2  Elimination; Bowel and/or Urine Urgency/Frequency 2  Medications: includes PCA/Opiates, Anti-convulsants, Anti-hypertensives, Diuretics, Hypnotics, Laxatives, Sedatives, and Psychotropics 0  Patient Care Equipment 1  Mobility-Assistance 2  Mobility-Gait 2  Mobility-Sensory Deficit 2  Altered awareness of immediate physical environment 1  Impulsiveness 0  Lack of understanding of one's physical/cognitive limitations 0  Total Score 20  Patient Fall Risk Level Moderate fall risk  Adult Fall Risk Interventions  Required Bundle Interventions *See Row Information* High fall risk - low, moderate, and high requirements implemented  Additional Interventions Use of appropriate toileting equipment (bedpan, BSC, etc.);Lap belt while in chair/wheelchair  Screening for Fall Injury Risk (To be completed on HIGH fall risk patients) - Assessing Need for Low Bed  Risk For Fall Injury- Low Bed Criteria Admitted as a result of a fall  Will Implement Low Bed and Floor Mats No - Criteria no longer met for low bed  Specialty Low Bed Contraindicated Hemodynamically unstable  Pain Assessment  Pain Scale 0-10  Pain Score 1  Faces Pain Scale 0  Pain Type Acute pain  Pain Location Head  Pain Orientation Posterior   Pain Intervention(s) MD notified (Comment);Medication (See eMAR)  PAINAD (Pain Assessment in Advanced Dementia)  Breathing 0  Negative Vocalization 0  Facial Expression 0  Body Language 0  Consolability 0  PAINAD Score 0  PCA/Epidural/Spinal Assessment  Respiratory Pattern Regular;Unlabored  Neurological  Neuro (WDL) X  Level of Consciousness Alert  Orientation Level Oriented to person;Oriented to time;Disoriented to situation;Oriented to place  Cognition Poor safety awareness;Poor judgement  Speech Clear;Delayed responses  RUE Sensation Full sensation  RUE Motor Strength 4  LUE Motor Response Purposeful movement  LUE Sensation Full sensation  LUE Motor Strength 5  RLE Motor Response Purposeful movement  RLE Sensation Full sensation  RLE Motor Strength 4  LLE Motor Response Purposeful movement  LLE Sensation Full sensation  LLE Motor Strength 4  Neuro Symptoms Forgetful  Neuro symptoms relieved by Rest  Musculoskeletal  Musculoskeletal (WDL) X  Assistive Device Stedy;Wheelchair  Generalized Weakness Yes  Weight Bearing Restrictions No  Integumentary  Integumentary (WDL) X  Skin Color Appropriate for ethnicity  Skin Condition Dry  Abrasion Intervention Other (Comment) (assessed)  Ecchymosis Location Arm;Abdomen;Leg  Ecchymosis Location Orientation Bilateral  Ecchymosis Intervention Other (Comment) (assessed)  Skin Tear Location Hand  Skin Tear Location Orientation Left;Anterior  Skin Tear Intervention Foam  Skin Turgor Non-tenting   

## 2019-08-10 NOTE — Progress Notes (Signed)
Speech Language Pathology Daily Session Note  Patient Details  Name: Yolanda Hopkins MRN: [id number] Date of Birth: 04/23/1941  Today's Date: 08/10/2019 SLP Individual Time: 0900-0944 SLP Individual Time Calculation (min): 44 min  Short Term Goals: Week 1: SLP Short Term Goal 1 (Week 1): Pt will utilize external memory aids to recall orientiaotn information in 5 out ot 100 oportunities with Mod A cues. SLP Short Term Goal 2 (Week 1): Pt will utilize speech intelligibility strategies at the phrase level to increase speech inteligiblity to ~ 75% with Mod A cues. SLP Short Term Goal 3 (Week 1): Pt will utilize word finding strategies to increase fluency of speech at the phrase level with Max A cues. SLP Short Term Goal 4 (Week 1): Pt will copmlete basic familiar problem solving tasks with Mod A cues. SLP Short Term Goal 5 (Week 1): Patient will consume current diet without overt s/s of aspiration and supervision level verbal cues for use of swallowing compensatory strategies.  Skilled Therapeutic Interventions: Pt was seen for skilled ST targeting cognitive goals. Pt oriented to self, required Min A verbal and visual cues to use external aids to orient to place and situation. Pt observed to carry over orientation information about place when MD arrive ~5 minutes later. Mod A required for problem solving use of calendar for orientation to time. Pt sorted cards by color (field of 6) with Mod faded to Min A cues for error awareness and sustained attention to task. Pt able to return demonstration for use of call bell to request assistance today, although due to decreased initiation and memory, suspect pt may not be able to retain this information at this time. External aid with picture and description with how to use call bell left with pt in room to assist with recall. Pt left in chair with seatbelt alarm in place, need met to her satisfaction, and RN present. Continue per current plan of care.         Pain Pain Assessment Pain Scale: 0-10 Pain Score: 0-No pain Faces Pain Scale: No hurt  Therapy/Group: Individual Therapy   E  08/10/2019, 7:02 AM   

## 2019-08-10 NOTE — Progress Notes (Signed)
Physical Therapy Session Note  Patient Details  Name: Yolanda Hopkins MRN: [id number] Date of Birth: 01/07/1941  Today's Date: 08/10/2019 PT Individual Time: 3612-2449 and 1330-1430 PT Individual Time Calculation (min): 30 min and 60 min Short Term Goals: Week 1:  PT Short Term Goal 1 (Week 1): Pt will transfer to [facility] with min assist and LRAD PT Short Term Goal 2 (Week 1): Pt will ambulate 29f with mod assist and LRAD PT Short Term Goal 3 (Week 1): Pt will perform bed mobility with min assist PT Short Term Goal 4 (Week 1): Pt will propel WC 737fwith min assist  Skilled Therapeutic Interventions/Progress Updates: Pt presented at toilet with nsg students present for toileting. Pt agreeable to therapy once toileting completed. Pt denies pain during session. Performed hand hygiene at sink with cues to rub hands together (initially just placed soap on hands and ran under water). Pt then transported to day room for time management. Participated in gait training 307f 3 with RW and minA. Pt required near mod A for turning to chair as was difficult to coordinate turning RW to align with chair. Pt required verbal cues for increasing step length R>L. Pt also participated in weaving through cones [id number] with RW with modA for staying within RW and not dragging RLE. Pt transported back to room at end of session and performed ambulatory transfer to recliner with RW mJay left in recliner at end of session with belt alarm placed, call bell within reach and needs met.   Pt presented in recliner agreeable to therapy. Pt denies pain during session and did not need toileting when asked. Pt performed stand pivot transfer to w/c with RW and modA due to poor RW management as pt would leave RW and continue turning towards w/c. Pt transported to ortho gym and participated in ball toss/bouncing without AD with min challenges outside BOS. Pt required seated rest between sets. Pt ambulated 80ft85f 40ft 21f minA and  PTA providing facilitation for increased pelvic rotation to facilitate advancement of RLE. In rehab gym pt performed toe taps to 2in step with RW and toe taps to target with cues to reach target with 1 step with RLE. Pt then transported back to room and performed ambulatory transfer to bed. Performed to supine with CGA to flat bed and was able to reposition to comfort with supervision. Pt left in bed at end of session with bed alarm on, call bell within reach and needs met.     Therapy Documentation Precautions:  Precautions Precautions: Fall Restrictions Weight Bearing Restrictions: No General:   Vital Signs: Therapy Vitals Temp: (!) 97.4 F (36.3 C) Pulse Rate: 61 Resp: 18 BP: 129/65 Patient Position (if appropriate): Sitting Oxygen Therapy SpO2: 96 % O2 Device: Room Air Pain:   Mobility:   Locomotion :    Trunk/Postural Assessment :    Balance:   Exercises:   Other Treatments:      Therapy/Group: Individual Therapy  Simeon Vera 08/10/2019, 3:42 PM

## 2019-08-11 ENCOUNTER — Inpatient Hospital Stay (HOSPITAL_COMMUNITY): Payer: PRIVATE HEALTH INSURANCE | Admitting: Occupational Therapy

## 2019-08-11 ENCOUNTER — Inpatient Hospital Stay (HOSPITAL_COMMUNITY): Payer: PRIVATE HEALTH INSURANCE | Admitting: Speech Pathology

## 2019-08-11 ENCOUNTER — Inpatient Hospital Stay (HOSPITAL_COMMUNITY): Payer: PRIVATE HEALTH INSURANCE | Admitting: Physical Therapy

## 2019-08-11 NOTE — Patient Care Conference (Signed)
Inpatient RehabilitationTeam Conference and Plan of Care Update Date: 08/11/2019   Time: 10:00 AM    Patient Name: Yolanda Hopkins      Medical Record Number: [id number]  Date of Birth: [birth year] Sex: Female         Room/Bed: 4W10C/4W10C-01 Payor Info: Payor: MEDICARE / Plan: MEDICARE PART A AND B / Product Type: *No Product type* /    Admit Date/Time:  08/05/2019  2:40 PM  Primary Diagnosis:  Acute ischemic left ACA stroke [facility])  Patient Active Problem List   Diagnosis Date Noted  . Incomplete defecation   . Slow transit constipation   . History of hypertension   . Urinary frequency   . Urinary incontinence   . Acute CVA (cerebrovascular accident) (HCC) 07/30/2019  . GI bleed 05/22/2016  . Pressure injury of skin 05/22/2016  . Loss of weight   . Dyslipidemia   . 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 HTN (hypertension) 08/19/2013  . Tobacco abuse 08/19/2013  . Vertigo 08/19/2013  . Gait abnormality 08/19/2013  . CVA (cerebral infarction) 08/19/2013    Expected Discharge Date: Expected Discharge Date: 08/20/19  Team Members Present: Physician leading conference: Dr. Claudette Laws Care Coodinator Present: Cheyenne Adas, RN, BSN, CRRN;Genie Latravis Grine, RN, MSN Nurse Present: Donna Bernard, LPN PT Present: Grier Rocher, PT OT Present: Jackquline Denmark, OT SLP Present: Suzzette Righter, CF-SLP     Current Status/Progress Goal Weekly Team Focus  Bowel/Bladder   pt is incontinent of B&B LBM-3/29  continue to give pt schedule mylax  assess B&B qshift and prn   Swallow/Nutrition/ Hydration   Dys 3, thin liquids, full supervision  Mod I  carryover swallow strateiges, regular trials when indicated   ADL's   UB set up/supervision, LB mod A, toileting mod A, mod A stand pivot transfers  supervision level overall  self care retraining, balance, NMR, pt/family edu   Mobility   Min A transfers, amb 80' min A with max cues         Communication   minimal verbal output,  very flat affect, intermittently verbally agitated with ST, ~75% intelligible, language of confusion at times  Min  carryover speech intelligibility strategies, inreasing verbal initiation and output, continue dx tx of expressive language   Safety/Cognition/ Behavioral Observations  Mod-Max basic problem solving, error awareness, recall, orientation  Min A  orientation with aids, recall with aids, basic problem solving   Pain   pt has no c/o of pain  keep pt pain free  assess pain qhshift and prn   Skin   knot on the back of head from falll 3/29, pt is coverd in brusing  assess skin qhsift and prn       Rehab Goals Patient on target to meet rehab goals: Yes *See Care Plan and progress notes for long and short-term goals.     Barriers to Discharge  Current Status/Progress Possible Resolutions Date Resolved   Nursing                  PT                    OT                  SLP                SW Decreased caregiver support   Son working on hiring help when he is at work  Discharge Planning/Teaching Needs:  Home with hired help when son is at work  TBD   Team Discussion: 79 yo with h/o B BG CVA, now L CVA, flat affect, neuropsych has seen, had recent fall.  RN inc B/B, BM 3/29, tele sitter at bedside.  OT seit up UB D, toileting mod A, LB D mod, S goals, needs timed toileting.  PT inc BM today, pivot transfer min A, min A gait with max cues, goals S.  SLP language of confusion, working on basic cognition/STM, min A goals.  Will go home with son/dtr to assist, will hire help as needed.   Revisions to Treatment Plan: N/A     Medical Summary Current Status: MASD, no further falls, needs telesitter, Weekly Focus/Goal: work on safety  Barriers to Discharge: Incontinence   Possible Resolutions to Barriers: toileting program   Continued Need for Acute Rehabilitation Level of Care: The patient requires daily medical management by a physician with specialized training in  physical medicine and rehabilitation for the following reasons: Direction of a multidisciplinary physical rehabilitation program to maximize functional independence : Yes Medical management of patient stability for increased activity during participation in an intensive rehabilitation regime.: Yes Analysis of laboratory values and/or radiology reports with any subsequent need for medication adjustment and/or medical intervention. : Yes   I attest that I was present, lead the team conference, and concur with the assessment and plan of the team.   [name] 08/12/2019, 6:33 PM   Team conference was held via web/ teleconference due to [place] - 19

## 2019-08-11 NOTE — Progress Notes (Addendum)
Occupational Therapy Session Note  Patient Details  Name: Yolanda Hopkins MRN: [id number] Date of Birth: Mar 07, 1941  Today's Date: 08/11/2019 OT Individual Time: 0215-0330 OT Individual Time Calculation (min): 75 min    Short Term Goals: Week 1:  OT Short Term Goal 1 (Week 1): Pt will complete 1/3 components of toileting with Mod balance assist OT Short Term Goal 2 (Week 1): Pt will complete UB dressing with Min A OT Short Term Goal 3 (Week 1): Pt will complete LB dressing with Mod A and LRAD  Skilled Therapeutic Interventions/Progress Updates:  Pt retrieved from RN station noted to have flat affect and slightly agitated, however reports needing to use bathroom. Pt required MIN A for stand pivot transfer from w/c >toilet with use of grab bars and tactile cues for body mechanics d/t motor apraxia. Pt completed hygiene with CGA via sit<>stand.  Pt agreeable to w/c mobility around unit as relaxation strategy. Pt reports liking Elvis Presley music. Turned on music in room as calming strategy. Pt agreeable to standing therapeutic activity of horseshoes to facilitate standing balance/ tolerance for ADL participation. Pt required CGA- supervision for balance during task. Pt declined standing for second trial of horse shoes but agreeable to seated trial. Pt reports needing to have BM with pt able to ambulate into bathroom with MIN A and no AD. Pt completed toileting in similar fashion as previously indicated however unsuccessful BM. Pt reports wanting to don shoes; pt able to reach out of BOS to retrieve shoes with CGA; MAX A to don socks and shoes. Pt able to ambulate ~ 15 ft with MIN A +2 and no AD to RN station. Pt communicating minimally with therapists throughout session however when pt did communicate with therapists language was often vulgar and slightly inappropriate. Pt left seated in w/c at nursing station with alarm belt activated as pt continues to be agitated and impulsive.   Therapy  Documentation Precautions:  Precautions Precautions: Fall Restrictions Weight Bearing Restrictions: No General:   Vital Signs: Therapy Vitals Temp: 98.3 F (36.8 C) Pulse Rate: 78 Resp: 20 BP: (!) 152/100 Patient Position (if appropriate): Sitting Oxygen Therapy SpO2: 98 % Pain: Pt reports no pain during session.   Therapy/Group: Individual Therapy  Angelina Pih 08/11/2019, 4:02 PM

## 2019-08-11 NOTE — Progress Notes (Signed)
PHYSICAL MEDICINE & REHABILITATION PROGRESS NOTE   Subjective/Complaints:  In PT, alert, flat affect, recognizes me as MD , no aphasia  Review of systems: Limited due to cognition  Objective:   CT HEAD WO CONTRAST  Result Date: 08/09/2019 CLINICAL DATA:  Recent fall, history of prior stroke EXAM: CT HEAD WITHOUT CONTRAST TECHNIQUE: Contiguous axial images were obtained from the base of the skull through the vertex without intravenous contrast. COMPARISON:  CT from 07/30/2019, MRI from 07/30/2019 FINDINGS: Brain: No findings to suggest acute hemorrhage, acute infarction or space-occupying mass lesion are noted. There are areas of decreased attenuation in the left deep white matter similar to that seen on prior MRI consistent with prior infarct. Changes of prior basal ganglia infarcts are noted as well. No other focal abnormality is noted. Vascular: No hyperdense vessel or unexpected calcification. Skull: Normal. Negative for fracture or focal lesion. Sinuses/Orbits: Orbits and their contents are within normal limits. Mild mucosal changes are noted within the ethmoid sinuses similar to that noted on the prior exam. Other: None IMPRESSION: Changes consistent with prior infarcts as described. No acute abnormality noted. Electronically Signed   By: [name] Catalina M.D.   On: 08/09/2019 21:30   No results for input(s): WBC, HGB, HCT, PLT in the last 72 hours. No results for input(s): NA, K, CL, CO2, GLUCOSE, BUN, CREATININE, CALCIUM in the last 72 hours.  Intake/Output Summary (Last 24 hours) at 08/11/2019 0851 Last data filed at 08/11/2019 0730 Gross per 24 hour  Intake 480 ml  Output --  Net 480 ml     Physical Exam: Vital Signs Blood pressure 128/71, pulse 71, temperature 98.5 F (36.9 C), temperature source Oral, resp. rate 18, height 5' 4.5" (1.638 m), weight 53.8 kg, SpO2 97 %.     General: No acute distress Mood and affect are appropriate Heart: Regular rate and rhythm no  rubs murmurs or extra sounds Lungs: Clear to auscultation, breathing unlabored, no rales or wheezes Abdomen: Positive bowel sounds, soft nontender to palpation, nondistended Extremities: No clubbing, cyanosis, or edema Skin: No evidence of breakdown, no evidence of rash Right visual field cut vs neglect    Motor: 5/5 throughout, except for right lower extremity, which is 3+/5  Assessment/Plan: 1. 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  Physiatrist is providing close team supervision and 24 hour management of active medical problems listed below.  Physiatrist and rehab team continue to assess barriers to discharge/monitor patient progress toward functional and medical goals  Care Tool:  Bathing    Body parts bathed by patient: Right arm, Left arm, Chest, Abdomen, Front perineal area, Buttocks, Right upper leg, Left upper leg, Face   Body parts bathed by helper: Right lower leg, Left lower leg     Bathing assist Assist Level: Moderate Assistance - Patient 50 - 74%     Upper Body Dressing/Undressing Upper body dressing   What is the patient wearing?: Pull over shirt    Upper body assist Assist Level: Set up assist    Lower Body Dressing/Undressing Lower body dressing      What is the patient wearing?: Pants     Lower body assist Assist for lower body dressing: Moderate Assistance - Patient 50 - 74%     Toileting Toileting Toileting Activity did not occur Landscape architect and hygiene only): Refused  Toileting assist Assist for toileting: Moderate Assistance - Patient 50 -  74%     Transfers Chair/bed transfer  Transfers assist     Chair/bed transfer assist level: Moderate Assistance - Patient 50 - 74%     Locomotion Ambulation   Ambulation assist      Assist level: Minimal Assistance - Patient > 75% Assistive device: Walker-rolling Max  distance: 108f   Walk 10 feet activity   Assist  Walk 10 feet activity did not occur: Safety/medical concerns  Assist level: Minimal Assistance - Patient > 75% Assistive device: Walker-rolling   Walk 50 feet activity   Assist Walk 50 feet with 2 turns activity did not occur: Safety/medical concerns  Assist level: Minimal Assistance - Patient > 75% Assistive device: Walker-rolling    Walk 150 feet activity   Assist Walk 150 feet activity did not occur: Safety/medical concerns         Walk 10 feet on uneven surface  activity   Assist Walk 10 feet on uneven surfaces activity did not occur: Safety/medical concerns         Wheelchair     Assist Will patient use wheelchair at discharge?: Yes Type of Wheelchair: Manual    Wheelchair assist level: Minimal Assistance - Patient > 75% Max wheelchair distance: 25    Wheelchair 50 feet with 2 turns activity    Assist        Assist Level: Moderate Assistance - Patient 50 - 74%   Wheelchair 150 feet activity     Assist      Assist Level: Total Assistance - Patient < 25%   Blood pressure 128/71, pulse 71, temperature 98.5 F (36.9 C), temperature source Oral, resp. rate 18, height 5' 4.5" (1.638 m), weight 53.8 kg, SpO2 97 %.   Medical Problem List and Plan: 1.  Right side weakness secondary to left ACA scattered infarcts in the left frontal lobe  Continue CIR PT OT speech- Team conference today please see physician documentation under team conference tab, met with team  to discuss problems,progress, and goals. Formulized individual treatment plan based on medical history, underlying problem and comorbidities.  2.  Antithrombotics: -DVT/anticoagulation: Lovenox  CBC within normal limits on 3/26             -antiplatelet therapy: Aspirin 325 mg daily and Plavix 75 mg daily x3 months then aspirin alone due to severe left ACA stenosis 3. Pain Management: Tylenol as needed 4. Mood/history of memory  loss.  Remeron 7.5 mg nightly, increase to 15 mg             -antipsychotic agents: N/A 5. Neuropsych: This patient is not capable of making decisions on her own behalf. 6. Skin/Wound Care: Routine skin checks  BMP within acceptable range on 3/26 7. Fluids/Electrolytes/Nutrition: Routine in and outs. 8.  Hyperlipidemia: Continue Lipitor 9.  Tobacco abuse.  NicoDerm patch.  Provide counseling 10. Incontinent of bowel and bladder?-   Several bowel movements after disimpaction 11. Hx of urinary frequency- not sure of underlying cause- heard from daughter, however could have been just her smoke break based on hx.  PVRs with low residuals.  Inconsistent incontinence, will consider medications, this is likely due to her cognition and may be difficult to treat with medication management 12.  History of hypertension: Not on any meds at present  Controlled Vitals:   08/10/19 1915 08/11/19 0621  BP: 118/64 128/71  Pulse: 71 71  Resp: 18   Temp: 98.3 F (36.8 C) 98.5 F (36.9 C)  SpO2: 95% 97%    LOS:  6 days A FACE TO FACE EVALUATION WAS PERFORMED  Charlett Blake 08/11/2019, 8:51 AM

## 2019-08-11 NOTE — Progress Notes (Signed)
Speech Language Pathology Daily Session Note  Patient Details  Name: Yolanda Hopkins MRN: [id number] Date of Birth: Mar 16, 1941  Today's Date: 08/11/2019 SLP Individual Time: 1000-1100 SLP Individual Time Calculation (min): 60 min  Short Term Goals: Week 1: SLP Short Term Goal 1 (Week 1): Pt will utilize external memory aids to recall orientiaotn information in 5 out ot 100 oportunities with Mod A cues. SLP Short Term Goal 2 (Week 1): Pt will utilize speech intelligibility strategies at the phrase level to increase speech inteligiblity to ~ 75% with Mod A cues. SLP Short Term Goal 3 (Week 1): Pt will utilize word finding strategies to increase fluency of speech at the phrase level with Max A cues. SLP Short Term Goal 4 (Week 1): Pt will copmlete basic familiar problem solving tasks with Mod A cues. SLP Short Term Goal 5 (Week 1): Patient will consume current diet without overt s/s of aspiration and supervision level verbal cues for use of swallowing compensatory strategies.  Skilled Therapeutic Interventions:  Skilled treatment session targeted pt's cognition goals. SLP recieved pt upright in recliner. Pt continues to be very disengaged in activities and demonstrates poor task tolerance. SLP facilitated goals by providing Max A to complete basic familiar tasks. She didn't demonstrate any evidence of word finding difficulty. When asked about her recent fall, pt stated that she expected "[place] to catch me and he didn't." She continued talking about him in the present tense. In addition during pt's initial SLE she stated that she was married, provided her husband's name, number of years that they are currently married, stated that she would be discharging home and he would be there to help at discharge. In an effort to gain baseline cognitive information, SLP reached out to contacts listed. Pt's husband passed away in 06/13/2022. Further information was gathered and team informed.   [id number] 75  minutes of non-billable time was spent with pt's son and his partner reviewing recommendation for full supervision with PO intake and cognitive deficits. This Clinical research associate provided education on current swallow abilities and both son and partner provided return demonstration. This Clinical research associate signed them off to provide supervision during meals. Will discuss with pt's primary SLP about allowing pt to consume water at bedside without supervision. Encouraged both of them to come in for therapies.       Pain    Therapy/Group: Individual Therapy  Yolanda Hopkins 08/11/2019, 3:47 PM

## 2019-08-11 NOTE — Plan of Care (Signed)
  Problem: Consults Goal: RH STROKE PATIENT EDUCATION Description: See Patient Education module for education specifics  Outcome: Progressing   Problem: RH BOWEL ELIMINATION Goal: RH STG MANAGE BOWEL WITH ASSISTANCE Description: STG Manage Bowel with min Assistance. Outcome: Progressing   Problem: RH BLADDER ELIMINATION Goal: RH STG MANAGE BLADDER WITH ASSISTANCE Description: STG Manage Bladder With min Assistance Outcome: Progressing   Problem: RH SAFETY Goal: RH STG ADHERE TO SAFETY PRECAUTIONS W/ASSISTANCE/DEVICE Description: STG Adhere to Safety Precautions With cues and reminders Outcome: Progressing   Problem: RH COGNITION-NURSING Goal: RH STG USES MEMORY AIDS/STRATEGIES W/ASSIST TO PROBLEM SOLVE Description: STG Uses Memory Aids/Strategies With min Assistance to Problem Solve. Outcome: Progressing   Problem: RH PAIN MANAGEMENT Goal: RH STG PAIN MANAGED AT OR BELOW PT'S PAIN GOAL Description: Pain level 3 on scale of 0-10 Outcome: Progressing   Problem: RH KNOWLEDGE DEFICIT Goal: RH STG INCREASE KNOWLEDGE OF HYPERTENSION Description: Pt will be able to adhere to medication regimen and dietary modification to better control blood pressure with min assist from caregiver.  Outcome: Progressing Goal: RH STG INCREASE KNOWLEDGE OF STROKE PROPHYLAXIS Description: Pt will be able to adhere to medication regimen and dietary modification to better prevent stroke with min assist from caregiver.  Outcome: Progressing   

## 2019-08-11 NOTE — Progress Notes (Signed)
Physical Therapy Session Note  Patient Details  Name: Yolanda Hopkins MRN: [id number] Date of Birth: 1940-08-07  Today's Date: 08/11/2019 PT Individual Time: 0800-0900 AND 6834-1962 PT Individual Time Calculation (min): 60 min 39 min Short Term Goals: Week 1:  PT Short Term Goal 1 (Week 1): Pt will transfer to [facility] with min assist and LRAD PT Short Term Goal 2 (Week 1): Pt will ambulate 24f with mod assist and LRAD PT Short Term Goal 3 (Week 1): Pt will perform bed mobility with min assist PT Short Term Goal 4 (Week 1): Pt will propel WC 729fwith min assist  Skilled Therapeutic Interventions/Progress Updates:  Session 1 Pt received supine in bed and agreeable to PT. Supine>sit transfer with min assist and min cues for awareness of the RUE to push into sitting.  Sit<>stand from EOB with min assist and increased time to initiate movement. Pt noted to have been incontinent of bowel. Ambulatory transfer to toilet with Min assist over all and max cues for step length on the R.   Pt able to have additional bowel and bladder void once on toilet. Sit<>stand x 3 with heavy use of rail and supervision assist. Min assist for perineal hygiene for adequate cleaning as well as set up and initiation of movements. PT assisted pt in lower body dressing with mod assist for time management. Ambulatory transfer to sink with RW and CGA. Hand hygiene with supervision assist for safety.   [facility] to force attention to normalized movement pattern on the R. Pregait reciprocal stepping over 1 inch cane on floor x 15 BLE. Gait training with RW x 6051fith min assist and max cues for step length on the R. PT applied visual aid to RW to enourage increased step length on the R, to kick target at front of RW. Able to performed adequates step length 50% of the time with visual cue x [id number] gait training with LUE over therpapist shoulder 2 x 50ft24fh mod assist to  facilitate pelvic rotation and initiation of hip flexion on the RLE with improved retention of step length.    [facility] returned to room and [facility] and all needs met.     Session 2.   Pt received sitting in WC and agreeable to PT.  Pt transported to day room. Stand pivot transfer to nustep on the L with UE supported on PT and min assist. nustep reciprocal endurance training with emphasis on sustained attention 4min,53mvel 4. Pt able to sustain reciprocal movement up to 1 min before requiring cues to continue from PT.   Dynamic balance training to perform lateral reach to grab then toss horse shoe. Min assist overall to prevent R lateral LOB and moderate cues for normalized posture and movement patterns with horse shoe toss.   Gait training x 15ft w13fHHA and moderate cues for step length on the R. Improved step length R compared to AM session.   [facility] returned to [facility] and [facility] with call bell in reach and all needs met.          Therapy Documentation Precautions:  Precautions Precautions: Fall Restrictions Weight Bearing Restrictions: No    Vital Signs: Therapy Vitals Temp: 98.5 F (36.9 C) Temp Source: Oral Pulse Rate: 71 BP: 128/71 Patient Position (if appropriate): Lying Oxygen Therapy SpO2: 97 % O2 Device: Room Air Pain:  denies   Therapy/Group: Individual Therapy  Lorie Phenix 08/11/2019, 9:29 AM

## 2019-08-12 ENCOUNTER — Inpatient Hospital Stay (HOSPITAL_COMMUNITY): Payer: PRIVATE HEALTH INSURANCE | Admitting: Physical Therapy

## 2019-08-12 ENCOUNTER — Inpatient Hospital Stay (HOSPITAL_COMMUNITY): Payer: PRIVATE HEALTH INSURANCE | Admitting: Speech Pathology

## 2019-08-12 ENCOUNTER — Inpatient Hospital Stay (HOSPITAL_COMMUNITY): Payer: PRIVATE HEALTH INSURANCE

## 2019-08-12 LAB — CREATININE, SERUM
Creatinine, Ser: 0.76 mg/dL (ref 0.44–1.00)
GFR calc Af Amer: >60 mL/min (ref >60)
GFR calc non Af Amer: >60 mL/min (ref >60)

## 2019-08-12 LAB — GLUCOSE, CAPILLARY: Glucose-Capillary: 82 mg/dL (ref 70–99)

## 2019-08-12 NOTE — Progress Notes (Signed)
Physical Therapy Session Note  Patient Details  Name: Yolanda Hopkins MRN: [id number] Date of Birth: [birth year]  Today's Date: 08/12/2019 PT Individual Time: 4098-1191 And 1530-1605 PT Individual Time Calculation (min): 53 min and 35 min   Short Term Goals: Week 1:  PT Short Term Goal 1 (Week 1): Pt will transfer to [facility] with min assist and LRAD PT Short Term Goal 2 (Week 1): Pt will ambulate 23f with mod assist and LRAD PT Short Term Goal 3 (Week 1): Pt will perform bed mobility with min assist PT Short Term Goal 4 (Week 1): Pt will propel WC 716fwith min assist Week 2:     Skilled Therapeutic Interventions/Progress Updates:  Session 1  Pt received supine in bed and agreeable to PT. Supine>sit transfer with supervision assist and min cues for awareness of the RLE. [facility] assist for AD control.   [facility] with RW and external cue to increase step length on the R to kick cone x [id number] with Cone removed and pt performed additional gait training with RW only; significant improvement in step length on the R compared to previous PT treatments. With step through gait pattern ~50% of steps on the R.   Dynamic balance training in parallel bars to perform side stepping L and R [id number] with min assist overall with moderate cues for step width to target on the R. Pt also performed ball ball off trampoline x 2 min with CGA assist to prevent balance, but unable to motor plan adequate force for ball to fully rebound to pt.    Stand pivot transfer to Nustep min assist. reciprocal movement training x 5 minutes, level 3, min-mod assist for full step length and symmetry on the R into hip flexion.   Pt returned to room and performed ambulatory transfer to bed with RW and min assist. Sit>supine completed with supervision assist, and left supine in bed with call bell in reach and all needs met.   Session 2    Pt received sitting in  WC and agreeable to PT. Pt's son present and provided information on home set up and d/c plan. Pt performed stand pivot transfer to [facility]the L with RW and min assist. Pt transported to rehab gym in WC. [facility] training in parallel bars x 5 BLE with CGA. Pt reports incontinent bowel movement. [facility] with RW and min assist and cues for step length. PT performed clothing management and pericare while pt maintained balance with rail. Mod assist to change lower body clothing. Ambulatory transfer back to recliner with min assist and min cues for Ad management and step length on the RLE. Pt declines additional Skilled therapy at this time. Left sitting in recliner with family present and all needs met.    Therapy Documentation Precautions:  Precautions Precautions: Fall Restrictions Weight Bearing Restrictions: No General: PT Amount of Missed Time (min): 10 Minutes PT Missed Treatment Reason: Patient unwilling to participate Vital Signs: Therapy Vitals Temp: 97.7 F (36.5 C) Temp Source: Oral Pulse Rate: 72 BP: 136/62 Patient Position (if appropriate): Lying Oxygen Therapy SpO2: 96 % O2 Device: Room Air Pain: Pain Assessment Pain Scale: 0-10 Pain Score: 0-No pain Faces Pain Scale: No hurt    Therapy/Group: Individual Therapy  AustLorie Phenix/2021, 8:55 AM

## 2019-08-12 NOTE — Progress Notes (Signed)
[place] PHYSICAL MEDICINE & REHABILITATION PROGRESS NOTE   Subjective/Complaints:  Pt does not remember author "lab tech?"  Review of systems: Limited due to cognition  Objective:   No results found. No results for input(s): WBC, HGB, HCT, PLT in the last 72 hours. Recent Labs    08/12/19 0518  CREATININE 0.76    Intake/Output Summary (Last 24 hours) at 08/12/2019 0847 Last data filed at 08/11/2019 1853 Gross per 24 hour  Intake 520 ml  Output --  Net 520 ml     Physical Exam: Vital Signs Blood pressure 136/62, pulse 72, temperature 97.7 F (36.5 C), temperature source Oral, resp. rate 20, height 5' 4.5" (1.638 m), weight 53.8 kg, SpO2 96 %.    General: No acute distress Mood and affect are appropriate Heart: Regular rate and rhythm no rubs murmurs or extra sounds Lungs: Clear to auscultation, breathing unlabored, no rales or wheezes Abdomen: Positive bowel sounds, soft nontender to palpation, nondistended Extremities: No clubbing, cyanosis, or edema Skin: No evidence of breakdown, no evidence of rash  Motor: 5/5 throughout, except for right lower extremity, which is 4+/5  Assessment/Plan: 1. 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  Physiatrist is providing close team supervision and 24 hour management of active medical problems listed below.  Physiatrist and rehab team continue to assess barriers to discharge/monitor patient progress toward functional and medical goals  Care Tool:  Bathing    Body parts bathed by patient: Right arm, Left arm, Chest, Abdomen, Front perineal area, Buttocks, Right upper leg, Left upper leg, Face   Body parts bathed by helper: Right lower leg, Left lower leg     Bathing assist Assist Level: Moderate Assistance - Patient 50 - 74%     Upper Body Dressing/Undressing Upper body dressing   What is the patient  wearing?: Pull over shirt    Upper body assist Assist Level: Set up assist    Lower Body Dressing/Undressing Lower body dressing      What is the patient wearing?: Pants     Lower body assist Assist for lower body dressing: Moderate Assistance - Patient 50 - 74%     Toileting Toileting Toileting Activity did not occur Press photographer and hygiene only): Refused  Toileting assist Assist for toileting: Minimal Assistance - Patient > 75%     Transfers Chair/bed transfer  Transfers assist     Chair/bed transfer assist level: Minimal Assistance - Patient > 75%     Locomotion Ambulation   Ambulation assist      Assist level: Minimal Assistance - Patient > 75% Assistive device: Walker-rolling Max distance: 60   Walk 10 feet activity   Assist  Walk 10 feet activity did not occur: Safety/medical concerns  Assist level: Minimal Assistance - Patient > 75% Assistive device: Walker-rolling   Walk 50 feet activity   Assist Walk 50 feet with 2 turns activity did not occur: Safety/medical concerns  Assist level: Minimal Assistance - Patient > 75% Assistive device: Walker-rolling    Walk 150 feet activity   Assist Walk 150 feet activity did not occur: Safety/medical concerns         Walk 10 feet on uneven surface  activity   Assist Walk 10 feet on uneven surfaces activity did not occur: Safety/medical concerns         Wheelchair     Assist Will patient use wheelchair at discharge?: Yes Type of  Wheelchair: Agricultural engineer assist level: Minimal Assistance - Patient > 75% Max wheelchair distance: 25    Wheelchair 50 feet with 2 turns activity    Assist        Assist Level: Moderate Assistance - Patient 50 - 74%   Wheelchair 150 feet activity     Assist      Assist Level: Total Assistance - Patient < 25%   Blood pressure 136/62, pulse 72, temperature 97.7 F (36.5 C), temperature source Oral, resp. rate 20, height  5' 4.5" (1.638 m), weight 53.8 kg, SpO2 96 %.   Medical Problem List and Plan: 1.  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  Antithrombotics: -DVT/anticoagulation: Lovenox  CBC within normal limits on 3/26             -antiplatelet therapy: Aspirin 325 mg daily and Plavix 75 mg daily x3 months then aspirin alone due to severe left ACA stenosis 3. Pain Management: Tylenol as needed 4. Mood/history of memory loss.  Remeron 7.5 mg nightly, increase to 15 mg             -antipsychotic agents: N/A 5. Neuropsych: This patient is not capable of making decisions on her own behalf. 6. Skin/Wound Care: Routine skin checks   7. Fluids/Electrolytes/Nutrition: Routine in and outs.creat nl 4/1 8.  Hyperlipidemia: Continue Lipitor 9.  Tobacco abuse.  NicoDerm patch.  Provide counseling 10. Incontinent of bowel and bladder?-   Several bowel movements after disimpaction 11. Hx of urinary frequency- not sure of underlying cause- heard from daughter, however could have been just her smoke break based on hx.  PVRs with low residuals.  Inconsistent incontinence, will consider medications, this is likely due to her cognition and may be difficult to treat with medication management 12.  History of hypertension: Not on any meds at present  Controlled Vitals:   08/12/19 0530 08/12/19 0735  BP: 109/69 136/62  Pulse: 64 72  Resp:    Temp: 97.8 F (36.6 C) 97.7 F (36.5 C)  SpO2: 97% 96%    LOS: 7 days A FACE TO FACE EVALUATION WAS PERFORMED  Charlett Blake 08/12/2019, 8:47 AM

## 2019-08-12 NOTE — Progress Notes (Signed)
Speech Language Pathology Daily Session Note  Patient Details  Name: Yolanda Hopkins MRN: [id number] Date of Birth: 02-23-1941  Today's Date: 08/12/2019 SLP Individual Time: 3976-7341 SLP Individual Time Calculation (min): 58 min  Short Term Goals: Week 1: SLP Short Term Goal 1 (Week 1): Pt will utilize external memory aids to recall orientiaotn information in 5 out ot 100 oportunities with Mod A cues. SLP Short Term Goal 2 (Week 1): Pt will utilize speech intelligibility strategies at the phrase level to increase speech inteligiblity to ~ 75% with Mod A cues. SLP Short Term Goal 3 (Week 1): Pt will utilize word finding strategies to increase fluency of speech at the phrase level with Max A cues. SLP Short Term Goal 4 (Week 1): Pt will copmlete basic familiar problem solving tasks with Mod A cues. SLP Short Term Goal 5 (Week 1): Patient will consume current diet without overt s/s of aspiration and supervision level verbal cues for use of swallowing compensatory strategies.  Skilled Therapeutic Interventions: Pt was seen for skilled ST targeting dysphagia and cognitive goals. Pt received eating regular texture lunch and thin liquids with NT and nursing student, SLP took over full supervision and provided skilled observation. Pt was resistant to SLP's verbal cues to use liquid wash to assist with oral clearance of solids, however ultimately achieved adequate oral clearance at her own pace. Mastication was efficient. No overt s/sx aspiration observed throughout intake of solids or liquids. Recommend continue current diet with full supervision during meals, but pt can have thin liquids at bedside without supervision.  Pt's son and his partner arrived for second half of session and were receptive to educational opportunities. They also provided more information regarding pt's baseline cognitive function. Although pt is likely not far from baseline cognitive function, pt's family reported their perception  pt was more verbose and socially engaged, more consistently oriented and better able to problem solve basic situations prior to acute CVA. When pt was fully engaged in both functional informal and structured tasks, she sustained her attention with Min A verbal cues for redirection. However, once pt became verbally agitated with tasks she refused to execute them to completion. She did create a grocery list with only Supervision A verbal cues, Mod A verbal and visual cues were provided for problem solving to locate those items within a grocery ad. During transfer from wheelchair to recliner, overall Mod A multimodal cues were required for safety awareness, Min A for sequencing. Pt left sitting in recliner with seatbelt alarm in place, nursing student and family members still present. Continue per current plan of care.        Pain Pain Assessment Pain Scale: 0-10 Pain Score: 0-No pain  Therapy/Group: Individual Therapy  Little Ishikawa 08/12/2019, 7:19 AM

## 2019-08-12 NOTE — Progress Notes (Signed)
Occupational Therapy Session Note  Patient Details  Name: Yolanda Hopkins MRN: [id number] Date of Birth: July 15, 1940  Today's Date: 08/12/2019 OT Individual Time: 1040-1103 OT Individual Time Calculation (min): 23 min    Short Term Goals: Week 1:  OT Short Term Goal 1 (Week 1): Pt will complete 1/3 components of toileting with Mod balance assist OT Short Term Goal 2 (Week 1): Pt will complete UB dressing with Min A OT Short Term Goal 3 (Week 1): Pt will complete LB dressing with Mod A and LRAD  Skilled Therapeutic Interventions/Progress Updates:    1:1. Pt received in bed somewhat agreeable to tx. Pt agreeable to changing clothing selecting preferred clothing from field of 2. Pt completes supine>sitting at EOB with MIN A and sit to stand multiple times with MOD A for power up and MIN A for steadying while RN changes brief, washes bottom and dons new brief. Pt requires max VC for hemi dressing techiniques d/t apraxic R extremities and pt stating, "damn you straight to hell" when OT suggests alternative strategies. Pt impulsively attempts to transfer to recliner with MOD A to change shirt with A  To doff overhead. S to don. Exited session with pt seate din recliner, exit alarm on and calll ight in reac  Therapy Documentation Precautions:  Precautions Precautions: Fall Restrictions Weight Bearing Restrictions: No General:   Vital Signs: Therapy Vitals Temp: 97.7 F (36.5 C) Temp Source: Oral Pulse Rate: 72 BP: 136/62 Patient Position (if appropriate): Lying Oxygen Therapy SpO2: 96 % O2 Device: Room Air Pain: Pain Assessment Pain Scale: 0-10 Pain Score: 0-No pain Faces Pain Scale: No hurt ADL: ADL Eating: Not assessed Grooming: Moderate assistance Where Assessed-Grooming: Standing at sink Upper Body Bathing: Supervision/safety Where Assessed-Upper Body Bathing: Sitting at sink Lower Body Bathing: Moderate assistance Where Assessed-Lower Body Bathing: Standing at sink,  Sitting at sink Upper Body Dressing: Moderate assistance Where Assessed-Upper Body Dressing: Sitting at sink Lower Body Dressing: Maximal assistance Where Assessed-Lower Body Dressing: Standing at sink, Sitting at sink Toileting: Not assessed Toilet Transfer: Moderate assistance Toilet Transfer Method: Stand pivot Toilet Transfer Equipment: Engineer, technical sales: Not assessed Vision   Perception    Praxis   Exercises:   Other Treatments:     Therapy/Group: Individual Therapy  Shon Hale 08/12/2019, 11:04 AM

## 2019-08-12 NOTE — Progress Notes (Signed)
Team Conference Report to Patient/Family  Team Conference discussion was reviewed with the patient, son and caregiver, including goals, any changes in plan of care and target discharge date.  Patient, son and caregiver expressed understanding and are in agreement.  The patient has a target discharge date of 08/20/19. REviewed request for podiatrist consult and referral was made per PA. Noted the podiatrist office will call the son to schedule and appointment. Also reviewed second COVID vaccine and appointment set up for the day of discharge per son at 1300. Discussed DME and recommended HH. They will also increase private duty staffing to assist at home. Had arranged for help 1p-5p PTA and patient had done well with that schedule.   Chana Bode B 08/12/2019, 4:42 PM

## 2019-08-13 ENCOUNTER — Inpatient Hospital Stay (HOSPITAL_COMMUNITY): Payer: PRIVATE HEALTH INSURANCE | Admitting: Speech Pathology

## 2019-08-13 ENCOUNTER — Inpatient Hospital Stay (HOSPITAL_COMMUNITY): Payer: PRIVATE HEALTH INSURANCE | Admitting: Physical Therapy

## 2019-08-13 ENCOUNTER — Inpatient Hospital Stay (HOSPITAL_COMMUNITY): Payer: PRIVATE HEALTH INSURANCE | Admitting: Occupational Therapy

## 2019-08-13 NOTE — Progress Notes (Signed)
[facility] PHYSICAL MEDICINE & REHABILITATION PROGRESS NOTE   Subjective/Complaints:  Sitting in recliner at nsg desk, reads my badge "MD"  No c/os  Review of systems: Limited due to cognition  Objective:   No results found. No results for input(s): WBC, HGB, HCT, PLT in the last 72 hours. Recent Labs    08/12/19 0518  CREATININE 0.76    Intake/Output Summary (Last 24 hours) at 08/13/2019 0910 Last data filed at 08/12/2019 1844 Gross per 24 hour  Intake 700 ml  Output --  Net 700 ml     Physical Exam: Vital Signs Blood pressure 105/67, pulse 70, temperature 98.1 F (36.7 C), temperature source Oral, resp. rate 14, height 5' 4.5" (1.638 m), weight 53.8 kg, SpO2 100 %.     General: No acute distress Mood and affect are flat Heart: Regular rate and rhythm no rubs murmurs or extra sounds Lungs: Clear to auscultation, breathing unlabored, no rales or wheezes Abdomen: Positive bowel sounds, soft nontender to palpation, nondistended Extremities: No clubbing, cyanosis, or edema Skin: No evidence of breakdown, no evidence of rash Neurologic: masked facies , bradykinesia, slowed verbal responses   Motor: 5/5 throughout, except for right lower extremity, which is 4+/5  Assessment/Plan: 1. 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  Physiatrist is providing close team supervision and 24 hour management of active medical problems listed below.  Physiatrist and rehab team continue to assess barriers to discharge/monitor patient progress toward functional and medical goals  Care Tool:  Bathing    Body parts bathed by patient: Right arm, Left arm, Chest, Abdomen, Front perineal area, Buttocks, Right upper leg, Left upper leg, Face   Body parts bathed by helper: Right lower leg, Left lower leg     Bathing assist Assist Level: Moderate Assistance - Patient 50 - 74%      Upper Body Dressing/Undressing Upper body dressing   What is the patient wearing?: Pull over shirt    Upper body assist Assist Level: Set up assist    Lower Body Dressing/Undressing Lower body dressing      What is the patient wearing?: Pants     Lower body assist Assist for lower body dressing: Moderate Assistance - Patient 50 - 74%     Toileting Toileting Toileting Activity did not occur Landscape architect and hygiene only): Refused  Toileting assist Assist for toileting: Minimal Assistance - Patient > 75%     Transfers Chair/bed transfer  Transfers assist     Chair/bed transfer assist level: Minimal Assistance - Patient > 75%     Locomotion Ambulation   Ambulation assist      Assist level: Minimal Assistance - Patient > 75% Assistive device: Walker-rolling Max distance: 100   Walk 10 feet activity   Assist  Walk 10 feet activity did not occur: Safety/medical concerns  Assist level: Minimal Assistance - Patient > 75% Assistive device: Walker-rolling   Walk 50 feet activity   Assist Walk 50 feet with 2 turns activity did not occur: Safety/medical concerns  Assist level: Minimal Assistance - Patient > 75% Assistive device: Walker-rolling    Walk 150 feet activity   Assist Walk 150 feet activity did not occur: Safety/medical concerns         Walk 10 feet on uneven surface  activity   Assist Walk 10 feet on uneven surfaces activity did not occur: Safety/medical concerns  Wheelchair     Assist Will patient use wheelchair at discharge?: Yes Type of Wheelchair: Manual    Wheelchair assist level: Minimal Assistance - Patient > 75% Max wheelchair distance: 25    Wheelchair 50 feet with 2 turns activity    Assist        Assist Level: Moderate Assistance - Patient 50 - 74%   Wheelchair 150 feet activity     Assist      Assist Level: Total Assistance - Patient < 25%   Blood pressure 105/67, pulse  70, temperature 98.1 F (36.7 C), temperature source Oral, resp. rate 14, height 5' 4.5" (1.638 m), weight 53.8 kg, SpO2 100 %.   Medical Problem List and Plan: 1.  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  Antithrombotics: -DVT/anticoagulation: Lovenox  CBC within normal limits on 3/26             -antiplatelet therapy: Aspirin 325 mg daily and Plavix 75 mg daily x3 months then aspirin alone due to severe left ACA stenosis 3. Pain Management: Tylenol as needed 4. Mood/history of memory loss.  Remeron 7.5 mg nightly, increase to 15 mg             -antipsychotic agents: N/A 5. Neuropsych: This patient is not capable of making decisions on her own behalf. 6. Skin/Wound Care: Routine skin checks   7. Fluids/Electrolytes/Nutrition: Routine in and outs.creat nl 4/1, po fluids improved to yesterday  8.  Hyperlipidemia: Continue Lipitor 9.  Tobacco abuse.  NicoDerm patch.  Provide counseling 10. Incontinent of bowel and bladder?-   Several bowel movements after disimpaction 11. Hx of urinary frequency- not sure of underlying cause- heard from daughter, however could have been just her smoke break based on hx.  PVRs with low residuals.  Inconsistent incontinence, will consider medications, this is likely due to her cognition and may be difficult to treat with medication management 12.  History of hypertension: Not on any meds at present  Controlled 4/2 Vitals:   08/12/19 2300 08/13/19 0400  BP:  105/67  Pulse:  70  Resp:  14  Temp:  98.1 F (36.7 C)  SpO2: 98% 100%    LOS: 8 days A FACE TO FACE EVALUATION WAS PERFORMED  Erick Colace 08/13/2019, 9:10 AM

## 2019-08-13 NOTE — Progress Notes (Signed)
Physical Therapy Weekly Progress Note  Patient Details  Name: Yolanda Hopkins MRN: [id number] Date of Birth: August 19, 1940  Beginning of progress report period: August 06, 2019 End of progress report period: August 13, 2019  Today's Date: 08/13/2019 PT Individual Time: 1300-1410 PT Individual Time Calculation (min): 70 min   Patient has met 4 of 4 short term goals.  Pt is making slow progress towards LTG. Over the past week pt has had inconsistent participation in therapy due to poor frustration tolerance with cognitively challenging tasks. She has progressing to supervision assist bed mobility, min assist transfer and a gait with RW for up to 154f and min assist WC propulsion. Patient continues to demonstrate the following deficits muscle weakness and muscle joint tightness, decreased cardiorespiratoy endurance, impaired timing and sequencing, abnormal tone, motor apraxia and decreased motor planning, decreased visual acuity, decreased visual perceptual skills and field cut, decreased initiation, decreased attention, decreased awareness, decreased problem solving, decreased safety awareness, decreased memory and delayed processing and decreased sitting balance, decreased standing balance, decreased postural control, hemiplegia and decreased balance strategies and therefore will continue to benefit from skilled PT intervention to increase functional independence with mobility.  Patient progressing toward long term goals..  Continue plan of care.  PT Short Term Goals Week 1:  PT Short Term Goal 1 (Week 1): [facility] Short Term Goal 1 - Progress (Week 1): Met PT Short Term Goal 2 (Week 1): Pt will ambulate 569fwith mod assist and LRAD PT Short Term Goal 2 - Progress (Week 1): Met PT Short Term Goal 3 (Week 1): Pt will perform bed mobility with min assist PT Short Term Goal 3 - Progress (Week 1): Met PT Short Term Goal 4 (Week 1): Pt will propel WC [id number]  min assist PT Short Term Goal 4 - Progress (Week 1): Met Week 2:  PT Short Term Goal 1 (Week 2): STG=LTG due to ELOS  Skilled Therapeutic Interventions/Progress Updates:   Pt received sitting in recliner and agreeable to PT. Gait training with HHA on the L x [id number] min-mod cues for symmetry for step length R and L. nustep reciprocal movement training 4 min BLE only and 3 min BUE and BLE with cues for symmetry and continued participation when frustrated. Additional gait training with RW x 120ft16fhall of rehab unit with moderate cues for step length on the R, but pt noted to have increasing frustration to cues for step length, resulting in internal distraction and difficulty with carryover for cues. Pt transported to hospital gift shop [facility] WC[facility] AD in gift shop with HHA/min assist and moderate cues for attention to task and improved attention to the RLE in community environment. Gait training also performed in food court of hospital x 60ft 39f cues for AD management and step length on the R in turns; Pt able to demonstrate adequately step length initially, but decreased over time.   WC mobility x 100ft i40fll of rehab unit with min assist to maintain straight path and prevent veer to the R as well as improved attention to task.   Pt transport to rehab gym. Dynamic standing balance/sustained attention to task while engaged in checkeres. Pt able demonstrate full visual scanning of the board with min assist and required only min cues for awareness of mistakes and corrections. Improve frustration tolerance with checkers compared to instruction with gait training. No LOB noted throughout standing balance task  on level surface with 1-0 UEsupport on bedside table. Patient returned to room and left sitting in recliner at RN station with call bell in reach and all needs met.        Therapy Documentation Precautions:  Precautions Precautions: Fall Restrictions Weight Bearing  Restrictions: No Vital Signs: Therapy Vitals Pulse Rate: 82 Resp: 20 BP: 104/64 Patient Position (if appropriate): Sitting Oxygen Therapy SpO2: 95 % O2 Device: Room Air Pain:   deines Therapy/Group: Individual Therapy  Lorie Phenix 08/13/2019, 5:35 PM

## 2019-08-13 NOTE — Progress Notes (Signed)
Speech Language Pathology Weekly Progress and Session Note  Patient Details  Name: Yolanda Hopkins MRN: [id number] Date of Birth: 06-Nov-1940  Beginning of progress report period: August 06, 2019 End of progress report period: August 13, 2019  Today's Date: 08/13/2019 SLP Individual Time: 4650-3546 SLP Individual Time Calculation (min): 60 min  Short Term Goals: Week 1: SLP Short Term Goal 1 (Week 1): Pt will utilize external memory aids to recall orientiaotn information in 5 out ot 100 oportunities with Mod A cues. SLP Short Term Goal 1 - Progress (Week 1): Met SLP Short Term Goal 2 (Week 1): Pt will utilize speech intelligibility strategies at the phrase level to increase speech inteligiblity to ~ 75% with Mod A cues. SLP Short Term Goal 2 - Progress (Week 1): Met SLP Short Term Goal 3 (Week 1): (n/a) SLP Short Term Goal 3 - Progress (Week 1): Discontinued (comment) SLP Short Term Goal 4 (Week 1): Pt will copmlete basic familiar problem solving tasks with Mod A cues. SLP Short Term Goal 4 - Progress (Week 1): Progressing toward goal SLP Short Term Goal 5 (Week 1): Patient will consume current diet without overt s/s of aspiration and supervision level verbal cues for use of swallowing compensatory strategies. SLP Short Term Goal 5 - Progress (Week 1): Met    New Short Term Goals: Week 2: SLP Short Term Goal 1 (Week 2): STG=LTG due to remaining LOS  Weekly Progress Updates: Pt has made functional gains and met 3 out of 4 applicable short term goals, 1 language goal was discontinued due to determination pt has language of confusion and overall limited verbal output as opposed to word finding deficits. Pt is currently Mod-Max assist for basic cognitive tasks due to cognitive impairments impacting short term memory, basic problem solving, selective attention, and intellectual awareness. Pt is consuming regular diet with thin liquids, and required Supervision A verbal cues for use of swallow  strategies. Pt and family education is ongoing. Pt would continue to benefit from skilled ST while inpatient in order to maximize functional independence and reduce burden of care prior to discharge. Anticipate that pt will need 24/7 supervision at discharge in addition to [place] follow up at next level of care.     Intensity: Minumum of 1-2 x/day, 30 to 90 minutes Frequency: 3 to 5 out of 7 days Duration/Length of Stay: 08/20/19 Treatment/Interventions: Dysphagia/aspiration precaution training;Therapeutic Activities;Environmental controls;Cueing hierarchy;Functional tasks;Cognitive remediation/compensation;Internal/external aids;Patient/family education;Speech/Language facilitation   Daily Session  Skilled Therapeutic Interventions: Pt was seen for skilled ST targeting dysphagia and cognitive goals. SLP provided regular texture snack and thin liquids (pt made choices and voiced preference for drink type with Min A verbal cues). Pt demonstrated efficient mastication and oral clearance Mod I for use of sips of liquid to assist with oral clearance.  No overt s/sx aspiration observed across solids or liquids. Recommend continue current diet. Pt was more pleasant and engaged in activities and social interactions with therapist today in comparison to yesterday. Although solitaire was a familiar card task family reports pt enjoyed prior to CVA, pt with no recall of instructions. New card task introduced (WAR) during which pt demonstrated basic problem solving with only Supervision A verbal cues and Mod decreasing to Min A verbal and visual cues for task initiation. Pt intermittently asking for validation, "Is this right", however ~90% accurate on her own. Near end of session pt became verbally agitated and forcefully ripped on safety seatbelt, and required Max A verbal cues and repetitions from  therapist to communicate she needed to use the restroom. Pt eventually compliant with sitting down in chair so we could  travel to restroom in her room. Max A verbal cues were required for sequencing and Mod A for safety awareness during transfers from chair to stedy, stedy to toilet, etc.   Pt returned to RN station with seatbelt alarm in place and needs met at end of session for supervision/safety.        Pain Pain Assessment Pain Scale: 0-10 Pain Score: 0-No pain  Therapy/Group: Individual Therapy  Arbutus Leas 08/13/2019, 7:23 AM

## 2019-08-13 NOTE — Progress Notes (Signed)
Occupational Therapy Session Note  Patient Details  Name: Yolanda Hopkins MRN: [id number] Date of Birth: 08/13/1940  Today's Date: 08/13/2019 OT Individual Time: 9935-7017 OT Individual Time Calculation (min): 71 min   Short Term Goals: Week 1:  OT Short Term Goal 1 (Week 1): Pt will complete 1/3 components of toileting with Mod balance assist OT Short Term Goal 2 (Week 1): Pt will complete UB dressing with Min A OT Short Term Goal 3 (Week 1): Pt will complete LB dressing with Mod A and LRAD  Skilled Therapeutic Interventions/Progress Updates:    Pt greeted in her w/c with NT present, set up to eat breakfast. Pt required setup for eating her meal. She poured milk into her coffee along with creamers, but per pt, she preferred drinking her coffee this way. When she was finished pt exhibited full clearance of oral cavity. After handed her shoes, pt initiated putting them on, needing assist to elevate the heel on the Rt side with pt cursing when attempting by herself x2. Mod A to boost into standing using RW and Min A for ambulatory transfer to the toilet. Pt required mod manual facilitation and vcs for proper AD management in front of toilet during transfer. Able to lower clothing with Min balance assist. She had continent B+B void. Mod A for sit<stand and Min A for dynamic balance while she completed perihygiene. OT assessed for thoroughness when she was finished with wash cloth coming out clean. After OT donned a clean brief pt was able to fully elevate her pants with vcs. She then ambulated to the sink to complete hand washing and oral care in standing. Once again, she needed verbal cuing and manual facilitation for proper placement of RW. Pt exhibited a slight posterior bias but was able to stand with Min balance assist. Pt required Min A and min vcs for completing stated tasks at the sink. Next transitioned to her w/c. Pt stated she was happy with the clothes she had on vs changing clothes this AM.  She was agreeable to engage in bedmaking tasks, focusing on praxis in moderately stimulating environment (housekeeper in the room). She doffed two pillowcases and donned 1 pillowcase with increased time for problem solving and setup assist. When handed the 2nd pillowcase, pt reported she was too tired to attempt. After given time to rest, pt stated "I can't" when prompted to don the last pillowcase on her pillow, needing Mod A to complete this task. Set pt up for transfer back to bed, per request, but during transfer she initiated transfer to the recliner, stating she didn't want to lay down anymore. Pt made comfortable with LEs elevated and safety belt fastened in recliner. Escorted her down to RN station and left in care of RN staff.  Therapy Documentation Precautions:  Precautions Precautions: Fall Restrictions Weight Bearing Restrictions: No Vital Signs: Therapy Vitals Pulse Rate: 82 Resp: 20 BP: 104/64 Patient Position (if appropriate): Sitting Oxygen Therapy SpO2: 95 % O2 Device: Room Air Pain: When asked if she had pain today, she stated: "I wish I could say no." Asked her where pain was and pt stated: "in my pussy." Asked if she had burning with urination and pain when urinating, and pt stated yes. Relayed this to RN when pt took her morning medication during session    ADL: ADL Eating: Not assessed Grooming: Moderate assistance Where Assessed-Grooming: Standing at sink Upper Body Bathing: Supervision/safety Where Assessed-Upper Body Bathing: Sitting at sink Lower Body Bathing: Moderate assistance Where Assessed-Lower  Body Bathing: Standing at sink, Sitting at sink Upper Body Dressing: Moderate assistance Where Assessed-Upper Body Dressing: Sitting at sink Lower Body Dressing: Maximal assistance Where Assessed-Lower Body Dressing: Standing at sink, Sitting at sink Toileting: Not assessed Toilet Transfer: Moderate assistance Toilet Transfer Method: Stand pivot Toilet Transfer  Equipment: Grab bars Tub/Shower Transfer: Not assessed      Therapy/Group: Individual Therapy  [name] A Wilbert Hayashi 08/13/2019, 4:13 PM

## 2019-08-14 ENCOUNTER — Inpatient Hospital Stay (HOSPITAL_COMMUNITY): Payer: PRIVATE HEALTH INSURANCE | Admitting: Speech Pathology

## 2019-08-14 ENCOUNTER — Inpatient Hospital Stay (HOSPITAL_COMMUNITY): Payer: PRIVATE HEALTH INSURANCE | Admitting: Physical Therapy

## 2019-08-14 DIAGNOSIS — N3942 Incontinence without sensory awareness: Secondary | ICD-10-CM

## 2019-08-14 NOTE — Progress Notes (Signed)
[facility] PHYSICAL MEDICINE & REHABILITATION PROGRESS NOTE   Subjective/Complaints:  Up in bed. No problems reported by staff.   ROS: Limited due to cognitive/behavioral   Objective:   No results found. No results for input(s): WBC, HGB, HCT, PLT in the last 72 hours. Recent Labs    08/12/19 0518  CREATININE 0.76    Intake/Output Summary (Last 24 hours) at 08/14/2019 1032 Last data filed at 08/14/2019 0841 Gross per 24 hour  Intake 780 ml  Output --  Net 780 ml     Physical Exam: Vital Signs Blood pressure (!) 134/54, pulse 82, temperature 97.9 F (36.6 C), temperature source Oral, resp. rate 16, height 5' 4.5" (1.638 m), weight 53.8 kg, SpO2 99 %.     Constitutional: No distress . Vital signs reviewed. HEENT: EOMI, oral membranes moist Neck: supple Cardiovascular: RRR without murmur. No JVD    Respiratory/Chest: CTA Bilaterally without wheezes or rales. Normal effort    GI/Abdomen: BS +, non-tender, non-distended Ext: no clubbing, cyanosis, or edema Psych: flat Skin: No evidence of breakdown, no evidence of rash Neurologic: masked facies , bradykinesia, slowed verbal responses   Motor: 5/5 throughout, except for right lower extremity, which is 4+/5  Assessment/Plan: 1. 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  Physiatrist is providing close team supervision and 24 hour management of active medical problems listed below.  Physiatrist and rehab team continue to assess barriers to discharge/monitor patient progress toward functional and medical goals  Care Tool:  Bathing    Body parts bathed by patient: Right arm, Left arm, Chest, Abdomen, Front perineal area, Buttocks, Right upper leg, Left upper leg, Face   Body parts bathed by helper: Right lower leg, Left lower leg     Bathing assist Assist Level: Moderate Assistance - Patient 50 - 74%      Upper Body Dressing/Undressing Upper body dressing   What is the patient wearing?: Pull over shirt    Upper body assist Assist Level: Set up assist    Lower Body Dressing/Undressing Lower body dressing      What is the patient wearing?: Pants     Lower body assist Assist for lower body dressing: Moderate Assistance - Patient 50 - 74%     Toileting Toileting Toileting Activity did not occur Landscape architect and hygiene only): Refused  Toileting assist Assist for toileting: Minimal Assistance - Patient > 75%     Transfers Chair/bed transfer  Transfers assist     Chair/bed transfer assist level: Minimal Assistance - Patient > 75%     Locomotion Ambulation   Ambulation assist      Assist level: Minimal Assistance - Patient > 75% Assistive device: Walker-rolling Max distance: 120   Walk 10 feet activity   Assist  Walk 10 feet activity did not occur: Safety/medical concerns  Assist level: Minimal Assistance - Patient > 75% Assistive device: Walker-rolling   Walk 50 feet activity   Assist Walk 50 feet with 2 turns activity did not occur: Safety/medical concerns  Assist level: Minimal Assistance - Patient > 75% Assistive device: Walker-rolling    Walk 150 feet activity   Assist Walk 150 feet activity did not occur: Safety/medical concerns         Walk 10 feet on uneven surface  activity   Assist Walk 10 feet on uneven surfaces activity did not occur: Safety/medical concerns  Wheelchair     Assist Will patient use wheelchair at discharge?: Yes Type of Wheelchair: Manual    Wheelchair assist level: Minimal Assistance - Patient > 75% Max wheelchair distance: 75    Wheelchair 50 feet with 2 turns activity    Assist        Assist Level: Minimal Assistance - Patient > 75%   Wheelchair 150 feet activity     Assist      Assist Level: Moderate Assistance - Patient 50 - 74%   Blood pressure (!) 134/54,  pulse 82, temperature 97.9 F (36.6 C), temperature source Oral, resp. rate 16, height 5' 4.5" (1.638 m), weight 53.8 kg, SpO2 99 %.   Medical Problem List and Plan: 1.  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  Antithrombotics: -DVT/anticoagulation: Lovenox  CBC within normal limits on 3/26             -antiplatelet therapy: Aspirin 325 mg daily and Plavix 75 mg daily x3 months then aspirin alone due to severe left ACA stenosis 3. Pain Management: Tylenol as needed 4. Mood/history of memory loss.  Remeron 7.5 mg nightly, increase to 15 mg             -antipsychotic agents: N/A 5. Neuropsych: This patient is not capable of making decisions on her own behalf. 6. Skin/Wound Care: Routine skin checks   7. Fluids/Electrolytes/Nutrition: encourage PO, +880 yesterday, eating 100% 8.  Hyperlipidemia: Continue Lipitor 9.  Tobacco abuse.  NicoDerm patch.  Provide counseling 10. Incontinent of bowel and bladder?-   Several bowel movements after disimpaction 11. Hx of urinary frequency- not sure of underlying cause- heard from daughter, however could have been just her smoke break based on hx.  PVRs with low residuals.  Intermittent incontinence with large cognitive component  -time voids 12.  History of hypertension: Not on any meds at present  Controlled 4/3 Vitals:   08/13/19 1931 08/14/19 0300  BP: 115/65 (!) 134/54  Pulse: 77 82  Resp: 16 16  Temp: (!) 97.5 F (36.4 C) 97.9 F (36.6 C)  SpO2: 98% 99%    LOS: 9 days A FACE TO FACE EVALUATION WAS PERFORMED  Yolanda Hopkins 08/14/2019, 10:32 AM

## 2019-08-14 NOTE — Progress Notes (Signed)
Physical Therapy Session Note  Patient Details  Name: Yolanda Hopkins MRN: [id number] Date of Birth: 1940-12-01  Today's Date: 08/14/2019 PT Individual Time: 1405-1500 PT Individual Time Calculation (min): 55 min   Short Term Goals: Week 2:  PT Short Term Goal 1 (Week 2): STG=LTG due to ELOS Week 3:     Skilled Therapeutic Interventions/Progress Updates:   Pt received sitting in recliner and agreeable to PT. Pt reports need to urinate. Ambulatory transfer to toilet with CGA and RW. Cues for safety and gait pattern once in bathroom for turn to sit on toilet. Pt able to void bladder(+). ambaulatory transfer to [facility] with CGA for safety.   Pt transported to rehab gym. Dynamic gait training to step over 10, 1 inch obstacles on floor, leading with the RLE. Min assist with HHA on the R from PT and moderate cues for step length and attention to the RLE. Gait training performed SPC x 77f with min assist and min cues for step length on the R. Pt able to achieve at least step to gait pattern on the R 75% of the time. Gait training with RW then performed x 1075fwith min assist and as again able to demonstrate at least step-to pattern with R LE trailing 75% of the time. Pt noted to become frustrated as PT increased verbal instruction for step through pattern and improved safety with use of AD in turns.   Dynamic balance training instructed by PT for pt to sustain static balance on ariex pad while engaged in fine motor/visual scanning task of checkers. Only required min cues for visual scanning to the R to attend to entire board and min cues to follow rules for game throughout as well as error correction when needed.   Patient returned to room and left sitting in recliner with call bell in reach and all needs met.        Therapy Documentation Precautions:  Precautions Precautions: Fall Restrictions Weight Bearing Restrictions: No    Vital Signs: Therapy Vitals Temp: 98.1 F (36.7 C) Pulse Rate:  73 Resp: 18 BP: 111/64 Patient Position (if appropriate): Sitting Oxygen Therapy SpO2: 99 % O2 Device: Room Air Pain: denies   Therapy/Group: Individual Therapy  AuLorie Phenix/07/2019, 3:18 PM

## 2019-08-14 NOTE — Progress Notes (Signed)
Speech Language Pathology Daily Session Note  Patient Details  Name: KEYONDA BICKLE MRN: [id number] Date of Birth: 02-29-1941  Today's Date: 08/14/2019 SLP Individual Time: 5830-9407 SLP Individual Time Calculation (min): 25 min  Short Term Goals: Week 2: SLP Short Term Goal 1 (Week 2): STG=LTG due to remaining LOS  Skilled Therapeutic Interventions:  Pt was seen for skilled ST targeting cognitive goals.  Pt remained disengaged throughout therapy session and refused many offered activities.  She was agreeable to a card game so SLP attempted to introduce a new card game; however, pt became increasingly irritable with attempts to instruct on task rules and procedures so task was abandoned for a familiar, previously targeted game.  Pt needed min assist for problem solving within task and quickly lost interest.  Pt was left in recliner with chair alarm set and call bell within reach.  Continue per current plan of care.     Pain Pain Assessment Pain Scale: 0-10 Pain Score: 0-No pain  Therapy/Group: Individual Therapy  Lea Walbert, Melanee Spry 08/14/2019, 11:17 AM

## 2019-08-15 ENCOUNTER — Inpatient Hospital Stay (HOSPITAL_COMMUNITY): Payer: PRIVATE HEALTH INSURANCE

## 2019-08-15 NOTE — Progress Notes (Signed)
Physical Therapy Session Note  Patient Details  Name: Yolanda Hopkins MRN: [id number] Date of Birth: 07-07-1940  Today's Date: 08/15/2019 PT Individual Time: 1515-1610 PT Individual Time Calculation (min): 55 min    Short Term Goals: Week 2:  PT Short Term Goal 1 (Week 2): STG=LTG due to ELOS  Skilled Therapeutic Interventions/Progress Updates:    Pt supine in bed upon PT arrival, agreeable to therapy tx and reports R knee pain but does not rate. Pt transferred to sitting with min assist, stand pivot to the w/c with min assist and transported to the gym. Pt performed stand pivot this session w/c<>nustep with min assist, used nustep x 4 minutes on workload 5 for global strengthening and reciprocal movement. Pt then ambulated 2 x 100 ft this session with RW and min assist, cues for increased R step length. Pt ambulated from gym<>rehab apartment this session 2 x 85 ft with RW and CGA-min assist, cues for RW management with turns and cues for increased R step length. Pt performed couch transfer this session min assist. Pt reports having to use bathroom, ambulated in/out bathroom with RW and CGA/min assist, cues for turning all the way around before sitting on toilet, continent of bladder, pt performed clothing management and pericare. Pt worked on standing balance this session without AD to perform horseshoe toss activity, x 2 trials with CGA-min assist for balance. Pt performed x 10 sit<>stands this session pushing up from w/c arm rests with UE s, working on LE strengthening and techniques, min assist. Pt transported back to room at end of session, stand pivot to bed and left supine with needs in reach and bed alarm set.   Therapy Documentation Precautions:  Precautions Precautions: Fall Restrictions Weight Bearing Restrictions: No    Therapy/Group: Individual Therapy  Cresenciano Genre, PT, DPT, CSRS 08/15/2019, 11:33 AM

## 2019-08-16 ENCOUNTER — Inpatient Hospital Stay (HOSPITAL_COMMUNITY): Payer: PRIVATE HEALTH INSURANCE | Admitting: Speech Pathology

## 2019-08-16 ENCOUNTER — Inpatient Hospital Stay (HOSPITAL_COMMUNITY): Payer: PRIVATE HEALTH INSURANCE | Admitting: Physical Therapy

## 2019-08-16 ENCOUNTER — Inpatient Hospital Stay (HOSPITAL_COMMUNITY): Payer: PRIVATE HEALTH INSURANCE | Admitting: Occupational Therapy

## 2019-08-16 MED ORDER — HYPROMELLOSE (GONIOSCOPIC) 2.5 % OP SOLN
1.0000 [drp] | OPHTHALMIC | Status: DC | PRN
  Filled 2019-08-16: qty 15

## 2019-08-16 NOTE — Progress Notes (Signed)
PHYSICAL MEDICINE & REHABILITATION PROGRESS NOTE   Subjective/Complaints:  Sitting at nsg desk, walked a lot with OT this am   ROS: Limited due to cognitive/behavioral   Objective:   No results found. No results for input(s): WBC, HGB, HCT, PLT in the last 72 hours. No results for input(s): NA, K, CL, CO2, GLUCOSE, BUN, CREATININE, CALCIUM in the last 72 hours.  Intake/Output Summary (Last 24 hours) at 08/16/2019 0906 Last data filed at 08/16/2019 0800 Gross per 24 hour  Intake 600 ml  Output --  Net 600 ml     Physical Exam: Vital Signs Blood pressure (!) 129/57, pulse 69, temperature 97.8 F (36.6 C), temperature source Oral, resp. rate 17, height 5' 4.5" (1.638 m), weight 53.8 kg, SpO2 98 %.    General: No acute distress Mood and affect are appropriate Heart: Regular rate and rhythm no rubs murmurs or extra sounds Lungs: Clear to auscultation, breathing unlabored, no rales or wheezes Abdomen: Positive bowel sounds, soft nontender to palpation, nondistended Extremities: No clubbing, cyanosis, or edema Skin: No evidence of breakdown, no evidence of rash   Motor: 5/5 throughout, except for right lower extremity, which is 4+/5  Assessment/Plan: 1. 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  Physiatrist is providing close team supervision and 24 hour management of active medical problems listed below.  Physiatrist and rehab team continue to assess barriers to discharge/monitor patient progress toward functional and medical goals  Care Tool:  Bathing    Body parts bathed by patient: Right arm, Left arm, Chest, Abdomen, Front perineal area, Buttocks, Right upper leg, Left upper leg, Face   Body parts bathed by helper: Right lower leg, Left lower leg     Bathing assist Assist Level: Moderate Assistance - Patient 50 - 74%     Upper Body  Dressing/Undressing Upper body dressing   What is the patient wearing?: Pull over shirt    Upper body assist Assist Level: Set up assist    Lower Body Dressing/Undressing Lower body dressing      What is the patient wearing?: Pants     Lower body assist Assist for lower body dressing: Moderate Assistance - Patient 50 - 74%     Toileting Toileting Toileting Activity did not occur Landscape architect and hygiene only): Refused  Toileting assist Assist for toileting: Contact Guard/Touching assist     Transfers Chair/bed transfer  Transfers assist     Chair/bed transfer assist level: Minimal Assistance - Patient > 75%     Locomotion Ambulation   Ambulation assist      Assist level: Minimal Assistance - Patient > 75% Assistive device: Walker-rolling Max distance: 100 ft   Walk 10 feet activity   Assist  Walk 10 feet activity did not occur: Safety/medical concerns  Assist level: Minimal Assistance - Patient > 75% Assistive device: Walker-rolling   Walk 50 feet activity   Assist Walk 50 feet with 2 turns activity did not occur: Safety/medical concerns  Assist level: Minimal Assistance - Patient > 75% Assistive device: Walker-rolling    Walk 150 feet activity   Assist Walk 150 feet activity did not occur: Safety/medical concerns         Walk 10 feet on uneven surface  activity   Assist Walk 10 feet on uneven surfaces activity did not occur: Safety/medical concerns         Wheelchair     Assist  Will patient use wheelchair at discharge?: Yes Type of Wheelchair: Manual    Wheelchair assist level: Minimal Assistance - Patient > 75% Max wheelchair distance: 75    Wheelchair 50 feet with 2 turns activity    Assist        Assist Level: Minimal Assistance - Patient > 75%   Wheelchair 150 feet activity     Assist      Assist Level: Moderate Assistance - Patient 50 - 74%   Blood pressure (!) 129/57, pulse 69,  temperature 97.8 F (36.6 C), temperature source Oral, resp. rate 17, height 5' 4.5" (1.638 m), weight 53.8 kg, SpO2 98 %.   Medical Problem List and Plan: 1.  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  Antithrombotics: -DVT/anticoagulation: Lovenox  CBC within normal limits on 3/26             -antiplatelet therapy: Aspirin 325 mg daily and Plavix 75 mg daily x3 months then aspirin alone due to severe left ACA stenosis 3. Pain Management: Tylenol as needed 4. Mood/history of memory loss.  Remeron 7.5 mg nightly, increase to 15 mg             -antipsychotic agents: N/A 5. Neuropsych: This patient is not capable of making decisions on her own behalf. 6. Skin/Wound Care: Routine skin checks   7. Fluids/Electrolytes/Nutrition: encourage PO, +880 yesterday, eating 100% 8.  Hyperlipidemia: Continue Lipitor 9.  Tobacco abuse.  NicoDerm patch.  Provide counseling 10. Incontinent of bowel and bladder?-   Several bowel movements after disimpaction 11. Hx of urinary frequency- not sure of underlying cause- heard from daughter, however could have been just her smoke break based on hx.  PVRs with low residuals.  Continent of bladder   -time voids 12.  History of hypertension: Not on any meds at present  Controlled 4/5 Vitals:   08/15/19 1939 08/16/19 0357  BP: (!) 151/77 (!) 129/57  Pulse: 86 69  Resp:  17  Temp: 98.7 F (37.1 C) 97.8 F (36.6 C)  SpO2: 97% 98%   13. Constipation improved on Miralax BID, remains continent  LOS: 11 days A FACE TO FACE EVALUATION WAS PERFORMED  Erick Colace 08/16/2019, 9:06 AM

## 2019-08-16 NOTE — Progress Notes (Signed)
Physical Therapy Session Note  Patient Details  Name: Yolanda Hopkins MRN: [id number] Date of Birth: Aug 26, 1940  Today's Date: 08/16/2019 PT Individual Time: 0951-1100 PT Individual Time Calculation (min): 69 min   Short Term Goals: Week 2:  PT Short Term Goal 1 (Week 2): STG=LTG due to ELOS  Skilled Therapeutic Interventions/Progress Updates: Pt presented in recliner at nsg station agreeable to therapy. Pt ambulated to day room with RW and CGA with step to pattern consistently. Pt participated in NuStep L2 x 7 min for global conditioning and endurance. Pt ambulated to high/low mat with CGA and consistent step to pattern. Gavin Pound SW advised that pt had rollator at home per family. Trialed use of rollator around day room including negotiation of cones and objects. Pt demonstrated increased R drag and poor safety with rollator vs RW. Pt participated in several rounds of corn hole standing on both level tile and Airex with pt demonstrating fair balance on Airex with some ankle strategy noted when leaning posteriorly to correct. After seated rest pt looking around and began mumbling, asked pt if needed to use bathroom to which pt indicated need. Ambulated back to room with RW and CGA initially step to pattern however with increased fatigue required mod verbal cues to increase step length to maintain step to pattern. Pt however was able to recall room number without prompting. Pt ambulated to toilet and performed toilet transfer with minA as pt poorly lined up with toilet (+BM/void). Performed peri-care with CGA as pt stood to perform peri-care and PTA provided wet washcloth. Pt ambulated to sink to perform hand hygiene CGA. After brief seated rest, pt ambulated to nsg station in same manner as prior however was able to intermittent improve R step length to achieve step to pattern. Pt returned to recliner at end of session and left with belt alarm on, and tray placed in front of pt.      Therapy  Documentation Precautions:  Precautions Precautions: Fall Restrictions Weight Bearing Restrictions: No   Therapy/Group: Individual Therapy  Ginette Bradway  Hisayo Delossantos, PTA  08/16/2019, 12:58 PM

## 2019-08-16 NOTE — Progress Notes (Signed)
Occupational Therapy Weekly Progress Note  Patient Details  Name: STEHANIE EKSTROM MRN: [id number] Date of Birth: 1940/09/17  Beginning of progress report period: 08/06/2019 End of progress report period: 08/16/2019  Today's Date: 08/16/2019 OT Individual Time: 8110-3159 OT Individual Time Calculation (min): 56 min    Patient has met 3 of 3 short term goals.   Pt is making steady progress towards LTGs at time of report. At time of evaluation, she required Mod A for stand pivot toilet transfers, progressing to now completing stated transfers with CGA at ambulatory level using RW. She has also increased functional independence with both UB and LB dressing tasks. Pt continues to require vcs for motor planning and safety awareness during all functional activity due to cognitive deficits. Continue OT POC.   Patient continues to demonstrate the following deficits: muscle weakness, decreased cardiorespiratoy endurance, motor apraxia, decreased coordination and decreased motor planning, decreased initiation, decreased attention, decreased awareness, decreased problem solving and decreased safety awareness and decreased sitting balance, decreased standing balance, decreased postural control and hemiplegia and therefore will continue to benefit from skilled OT intervention to enhance overall performance with BADL.  Patient progressing toward long term goals..  Continue plan of care.  OT Short Term Goals Week 1:  OT Short Term Goal 1 (Week 1): Pt will complete 1/3 components of toileting with Mod balance assist OT Short Term Goal 1 - Progress (Week 1): Met OT Short Term Goal 2 (Week 1): Pt will complete UB dressing with Min A OT Short Term Goal 2 - Progress (Week 1): Met OT Short Term Goal 3 (Week 1): Pt will complete LB dressing with Mod A and LRAD OT Short Term Goal 3 - Progress (Week 1): Met Week 2:  OT Short Term Goal 1 (Week 2): STGs=LTGs due to ELOS  Skilled Therapeutic Interventions/Progress  Updates:    Pt greeted in the hallway with several members of the RN staff with her. Per staff, pt just stood up and began ambulating while in the middle of breakfast and could not be redirected to sit back down. OT redirected pt back into room for toilet transfer using RW with CGA. Multimodal cues throughout session for increasing stride length with Rt LE, standing closer to RW, and safe RW placement during all mobility as she tends to abandon device. Pt had continent B+B void, needed vcs to lower clothing before sitting down on toilet. Vcs also for thoroughness when she completed hygiene herself. With instruction pt completed handwashing at the sink and then transitioned back to her recliner. She finished her meal with Min A for basic problem solving. Note that when she has trouble with motor planning and problem solving pt tends to curse and repeat the same unsuccessful strategy. The same cuing required when completing oral care while sitting at the sink afterwards. Pt not initiating task unless toothpaste and toothbrush are presented on side of sink vs in small kidney cup with other items. Pt initiated ambulation into the dayroom using RW with CGA. Mod A for sit<stand from armless chair after seated rest. Noted slower pace on the way back to her room due to fatigue with pt needing min cuing for pathfinding. While sitting, pt doffed her gripper socks using figure 4 position bilaterally and donned clean socks and shoes with supervision assist. She refused to return to bed. Left her in the recliner with safety belt fastened and LEs elevated, positioned at RN station with 2 therapeutic activities to keep her occupied.  Therapy Documentation Precautions:  Precautions Precautions: Fall Restrictions Weight Bearing Restrictions: No Vital Signs: Therapy Vitals Temp: 97.8 F (36.6 C) Temp Source: Oral Pulse Rate: 69 Resp: 17 BP: (!) 129/57 Oxygen Therapy SpO2: 98 % Pain: pt with no c/o pain during  tx   ADL: ADL Eating: Not assessed Grooming: Moderate assistance Where Assessed-Grooming: Standing at sink Upper Body Bathing: Supervision/safety Where Assessed-Upper Body Bathing: Sitting at sink Lower Body Bathing: Moderate assistance Where Assessed-Lower Body Bathing: Standing at sink, Sitting at sink Upper Body Dressing: Moderate assistance Where Assessed-Upper Body Dressing: Sitting at sink Lower Body Dressing: Maximal assistance Where Assessed-Lower Body Dressing: Standing at sink, Sitting at sink Toileting: Not assessed Toilet Transfer: Moderate assistance Toilet Transfer Method: Stand pivot Toilet Transfer Equipment: Ambulance person Transfer: Not assessed :     Therapy/Group: Individual Therapy  Noa Galvao A Gearld Kerstein 08/16/2019, 7:04 AM

## 2019-08-16 NOTE — Progress Notes (Signed)
Speech Language Pathology Daily Session Note  Patient Details  Name: LEOMIA BLAKE MRN: [id number] Date of Birth: [birth year]  Today's Date: 08/16/2019 SLP Individual Time: 1240-1320 SLP Individual Time Calculation (min): 40 min  Short Term Goals: Week 2: SLP Short Term Goal 1 (Week 2): STG=LTG due to remaining LOS  Skilled Therapeutic Interventions: Pt was seen for skilled ST intervention targeting goals for increasing awareness, orientation, intelligibility, recall, and problem solving. Pt exhibited flat affect and was poorly cooperative with unfamiliar therapist. SLP facilitated session by engaging pt in conversation about her children and grandchildren. Pt frequently responded with repeated and increasingly louder "I don't know I don't know I don't know!!" She was able to provide names Thayer Ohm, Thersa Salt) and birth years of her children. Per pt, all were born in May, and are currently 45, 69, and 80 years of age.  She provided 5 names when asked if she had grandchildren, but was unable to indicate which grandchild belonged to which of her children, becoming more and more physically irritated and verbally agitated. Pt attempted to remove lap belt on several occasions, despite education regarding its purpose. Pt told SLP to "get the hell out of here" several times despite SLP offering different tasks to work on. Session was terminated early due to continued increase in agitation and inability to engage pt in a functional activity.   Pt was left in recliner with alarm on, all needs within reach. Continue ST per current plan of care.   Pain Pain Assessment Pain Scale: 0-10 Pain Score: 0-No pain  Therapy/Group: Individual Therapy  Shasta Chinn B. Murvin Natal, [facility], CCC-SLP Speech Language Pathologist  Leigh Aurora 08/16/2019, 1:37 PM

## 2019-08-17 ENCOUNTER — Inpatient Hospital Stay (HOSPITAL_COMMUNITY): Payer: PRIVATE HEALTH INSURANCE | Admitting: Physical Therapy

## 2019-08-17 ENCOUNTER — Inpatient Hospital Stay (HOSPITAL_COMMUNITY): Payer: PRIVATE HEALTH INSURANCE | Admitting: Occupational Therapy

## 2019-08-17 ENCOUNTER — Inpatient Hospital Stay (HOSPITAL_COMMUNITY): Payer: PRIVATE HEALTH INSURANCE | Admitting: Speech Pathology

## 2019-08-17 NOTE — Progress Notes (Signed)
Occupational Therapy Session Note  Patient Details  Name: RAMATA STROTHMAN MRN: [id number] Date of Birth: May 09, 1941  Today's Date: 08/17/2019 OT Individual Time: 1450-1530 OT Individual Time Calculation (min): 40 min    Short Term Goals: Week 1:  OT Short Term Goal 1 (Week 1): Pt will complete 1/3 components of toileting with Mod balance assist OT Short Term Goal 1 - Progress (Week 1): Met OT Short Term Goal 2 (Week 1): Pt will complete UB dressing with Min A OT Short Term Goal 2 - Progress (Week 1): Met OT Short Term Goal 3 (Week 1): Pt will complete LB dressing with Mod A and LRAD OT Short Term Goal 3 - Progress (Week 1): Met  Skilled Therapeutic Interventions/Progress Updates:  Patient met seated in recliner attempting to disable belt alarm. Patient appeared very agitated repeating that she needed to leave. OT offered toileting with patient in agreement. Functional mobility to bathroom with use of RW and assistance for walker management. Toileting/hygiene/clothing management with CGA-Min A. Patient stood at sink level to wash hands with ability to sequence task with minimal cueing. Patient transported to dayroom with total assist secondary to continued agitation. Offered snack with patient in agreement. Patient completed snack of gram crackers and cranberry juice with ability to open packages without assistance. Patient able to stand with maximal coaxing/encouragement for participation in standing tolerance activity to sort connect-4 chips. Functional mobility ~49f back to room with CGA-Min A. Session concluded with patient seated at nurses station with belt alarm activated and all needs met.     Therapy Documentation Precautions:  Precautions Precautions: Fall Restrictions Weight Bearing Restrictions: No   Therapy/Group: Individual Therapy  Ekaterina Denise R Howerton-Davis 08/17/2019, 7:57 AM

## 2019-08-17 NOTE — Progress Notes (Signed)
Physical Therapy Session Note  Patient Details  Name: WANETTA FUNDERBURKE MRN: [id number] Date of Birth: 01/05/1941  Today's Date: 08/17/2019 PT Individual Time: 1010-1050 and 5300-5110 PT Individual Time Calculation (min): 40 min and 30 min  Short Term Goals: Week 2:  PT Short Term Goal 1 (Week 2): STG=LTG due to ELOS  Skilled Therapeutic Interventions/Progress Updates: Pt presented in recliner ageeable to participate with encouragement. Pt inicated need for bathroom when asked. Performed STS from recliner with minA and cues for hand placement. Pt ambulated to bathroom with step to pattern and performed toilet transfers CGA (+void). Pt then ambulated to sink and performed hand hygiene in standing. Pt encouraged to transfer to w/c and transported to ortho gym. In gym attempted to engage pt in participating in puzzle. Pt was able to engage that she used to have a cat, and mentioned that she "can't think anymore". Pt was able to place eye pieces in appropriate place however was unable to initiate any additional pieces even when placed in hand. Pt refused any w/c propulsion however PTA was able to encourage pt to ambulate approx 134f with RW with step to pattern. Pt was able to follow command to "step R foot closer to pink thing" which allowed pt to perform step through pattern however was unable to maintain. Pt transported remaining distance back to room and performed stand pivot transfer HHA to recliner. Pt left in recliner at end of session with belt alarm on, call bell within reach and needs met.   Tx2: Pt presented in recliner agreeable to participation with encouragement. Pt expressing need for bathroom when asked. Pt performed STS minA with cues for hand placement as pt attempted to pull up from RW. Pt ambulated to toilet CGA and performed toilet transfers with close S (+void). Pt then ambulated to sink and performed hand hygiene in standing. Pt sat in w/c in preparation to leave room then became  agitated when PTA attempted to place leg rests (remained off). Pt then states ok when asked if will wear mask (as had refused in am session). Pt donned mask then doffed and handed to PTA. Pt then slowly gestured mimicking slapping PTA. PTA advised pt such behavior in inappropriate. Pt verbalized did not want to be in w/c but wanted to walk. PTA placed RW in front of pt. Pt attempting to stand however was trying to pull up on RW vs pushing from w/c. Pt was able to pull up from w/c once RW was moved away from pt. Pt ambulated to day room with step to pattern and pt with anterior lean due to RW being so far away from pt. Pt was able to sit at [employer] then once set up becoming agitated and trying to get out of NuStep while stating "I'm done with this". Pt ambulated back to room in same manner as prior then returned to recliner with pt pushing RW away and furniture walking to chair. Pt left in recliner with belt alarm on, call bell within reach and current needs met.          Therapy Documentation Precautions:  Precautions Precautions: Fall Restrictions Weight Bearing Restrictions: No General: PT Amount of Missed Time (min): 15 Minutes PT Missed Treatment Reason: Increased agitation;Patient unwilling to participate Vital Signs:   Pain: Pain Assessment Pain Scale: 0-10 Pain Score: 0-No pain  Therapy/Group: Individual Therapy  Caydon Feasel  Amalie Koran, PTA  08/17/2019, 12:50 PM

## 2019-08-17 NOTE — Progress Notes (Signed)
Speech Language Pathology Daily Session Note  Patient Details  Name: Yolanda Hopkins MRN: [id number] Date of Birth: 10/16/1940  Today's Date: 08/17/2019 SLP Individual Time: 0801-0830 SLP Individual Time Calculation (min): 29 min  Short Term Goals: Week 2: SLP Short Term Goal 1 (Week 2): STG=LTG due to remaining LOS  Skilled Therapeutic Interventions: Pt was seen for skilled ST targeting swallow and cognitive goals. Pt required 1 verbal cue for use of liquid wash and lingual sweep to assist with clearance of buccal pocketing of eggs and sausage from her regular texture breakfast tray. No overt s/sx aspiration observed across solids of thin liquids. Pt provided appropriate initiation and verbal responses to questions to make choices regarding set up of tray and communicate personal preferences. Pt required Min A to use calendar to re-orient to day of the week and date. Mod A visual and verbal cues required for locating and using of aids to orient to place and situation. Pt spontaneously expressed need to have a bowel movement and although she began pushing items away from her bed, she was more responsive to Mod A verbal cues to reduce impulsivity in order for SLP to prepare for safe transfer. Pt followed directions and waiting for NT to arrive; pt left sitting in bed with NT present to assist with toileting needs. Continue per current plan of care.         Pain Pain Assessment Pain Scale: 0-10 Pain Score: 0-No pain  Therapy/Group: Individual Therapy  Little Ishikawa 08/17/2019, 7:09 AM

## 2019-08-17 NOTE — Plan of Care (Signed)
  Problem: RH BOWEL ELIMINATION Goal: RH STG MANAGE BOWEL WITH ASSISTANCE Description: STG Manage Bowel with min Assistance. Outcome: Progressing   Problem: RH BLADDER ELIMINATION Goal: RH STG MANAGE BLADDER WITH ASSISTANCE Description: STG Manage Bladder With min Assistance Outcome: Progressing   Problem: RH SAFETY Goal: RH STG ADHERE TO SAFETY PRECAUTIONS W/ASSISTANCE/DEVICE Description: STG Adhere to Safety Precautions With cues and reminders Outcome: Progressing   Problem: RH COGNITION-NURSING Goal: RH STG USES MEMORY AIDS/STRATEGIES W/ASSIST TO PROBLEM SOLVE Description: STG Uses Memory Aids/Strategies With min Assistance to Problem Solve. Outcome: Progressing   Problem: RH PAIN MANAGEMENT Goal: RH STG PAIN MANAGED AT OR BELOW PT'S PAIN GOAL Description: Pain level 3 on scale of 0-10 Outcome: Progressing   

## 2019-08-17 NOTE — Progress Notes (Signed)
Physical Therapy Session Note  Patient Details  Name: Yolanda Hopkins MRN: [id number] Date of Birth: [birth year]  Today's Date: 08/17/2019 PT Individual Time: 7591-6384 PT Individual Time Calculation (min): 15 min  PT Missed Time: 15 min Missed Time Reason: Patient unwilling to participate  Short Term Goals: Week 2:  PT Short Term Goal 1 (Week 2): STG=LTG due to ELOS  Skilled Therapeutic Interventions/Progress Updates:    Pt received seated on toilet handed off from NT. Pt is CGA for toilet transfer, Supervision for standing balance while performing pericare. Stand pivot transfer toilet to w/c with use of grab bars and min A. Pt is able to wash her hands while seated at sink. Pt then encouraged to perform ambulation during therapy session. Pt states, "No I don't want to do that". Pt encouraged to be taken to therapy gym for participation in other therapeutic activities, pt just states, "No". Sit to stand with CGA to RW. Ambulation x 10 ft from w/c to recliner with RW and min A, max cueing for RLE step length. Pt left seated in recliner in room with needs in reach, quick release belt and chair alarm in place, telesitter present. Pt missed 15 min of scheduled therapy session due to refusal to participate.  Therapy Documentation Precautions:  Precautions Precautions: Fall Restrictions Weight Bearing Restrictions: No    Therapy/Group: Individual Therapy   Peter Congo, PT, DPT  08/17/2019, 8:46 AM

## 2019-08-17 NOTE — Progress Notes (Signed)
[facility] [place] PHYSICAL MEDICINE & REHABILITATION PROGRESS NOTE   Subjective/Complaints:  Just finsihed eating breakfast with SLP who is on her R side, did not recall SLP in room or that she just ate breakfast   ROS: Limited due to cognitive/behavioral   Objective:   No results found. No results for input(s): WBC, HGB, HCT, PLT in the last 72 hours. No results for input(s): NA, K, CL, CO2, GLUCOSE, BUN, CREATININE, CALCIUM in the last 72 hours.  Intake/Output Summary (Last 24 hours) at 08/17/2019 0812 Last data filed at 08/16/2019 2300 Gross per 24 hour  Intake 840 ml  Output --  Net 840 ml     Physical Exam: Vital Signs Blood pressure 120/70, pulse 69, temperature 97.7 F (36.5 C), resp. rate 20, height 5' 4.5" (1.638 m), weight 53.8 kg, SpO2 95 %.     General: No acute distress Mood and affect are appropriate Heart: Regular rate and rhythm no rubs murmurs or extra sounds Lungs: Clear to auscultation, breathing unlabored, no rales or wheezes Abdomen: Positive bowel sounds, soft nontender to palpation, nondistended Extremities: No clubbing, cyanosis, or edema Skin: No evidence of breakdown, no evidence of rash  Musculoskeletal: Full range of motion in all 4 extremities. No joint swelling   Motor: 5/5 throughout, except for right lower extremity, which is 4+/5  Assessment/Plan: 1. 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  Physiatrist is providing close team supervision and 24 hour management of active medical problems listed below.  Physiatrist and rehab team continue to assess barriers to discharge/monitor patient progress toward functional and medical goals  Care Tool:  Bathing    Body parts bathed by patient: Right arm, Left arm, Chest, Abdomen, Front perineal area, Buttocks, Right upper leg, Left upper leg, Face   Body parts bathed by helper: Right lower  leg, Left lower leg     Bathing assist Assist Level: Moderate Assistance - Patient 50 - 74%     Upper Body Dressing/Undressing Upper body dressing   What is the patient wearing?: Pull over shirt    Upper body assist Assist Level: Set up assist    Lower Body Dressing/Undressing Lower body dressing      What is the patient wearing?: Pants     Lower body assist Assist for lower body dressing: Moderate Assistance - Patient 50 - 74%     Toileting Toileting Toileting Activity did not occur Press photographer and hygiene only): Refused  Toileting assist Assist for toileting: Contact Guard/Touching assist     Transfers Chair/bed transfer  Transfers assist     Chair/bed transfer assist level: Minimal Assistance - Patient > 75%     Locomotion Ambulation   Ambulation assist      Assist level: Minimal Assistance - Patient > 75% Assistive device: Walker-rolling Max distance: 100 ft   Walk 10 feet activity   Assist  Walk 10 feet activity did not occur: Safety/medical concerns  Assist level: Minimal Assistance - Patient > 75% Assistive device: Walker-rolling   Walk 50 feet activity   Assist Walk 50 feet with 2 turns activity did not occur: Safety/medical concerns  Assist level: Minimal Assistance - Patient > 75% Assistive device: Walker-rolling    Walk 150 feet activity   Assist Walk 150 feet activity did not occur: Safety/medical concerns         Walk 10 feet on uneven surface  activity   Assist Walk 10 feet  on uneven surfaces activity did not occur: Safety/medical concerns         Wheelchair     Assist Will patient use wheelchair at discharge?: Yes Type of Wheelchair: Manual    Wheelchair assist level: Minimal Assistance - Patient > 75% Max wheelchair distance: 75    Wheelchair 50 feet with 2 turns activity    Assist        Assist Level: Minimal Assistance - Patient > 75%   Wheelchair 150 feet activity      Assist      Assist Level: Moderate Assistance - Patient 50 - 74%   Blood pressure 120/70, pulse 69, temperature 97.7 F (36.5 C), resp. rate 20, height 5' 4.5" (1.638 m), weight 53.8 kg, SpO2 95 %.   Medical Problem List and Plan: 1.  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  Antithrombotics: -DVT/anticoagulation: Lovenox  CBC within normal limits on 3/26             -antiplatelet therapy: Aspirin 325 mg daily and Plavix 75 mg daily x3 months then aspirin alone due to severe left ACA stenosis 3. Pain Management: Tylenol as needed 4. Mood/history of memory loss.  Remeron 7.5 mg nightly, increase to 15 mg             -antipsychotic agents: N/A 5. Neuropsych: This patient is not capable of making decisions on her own behalf.Multi infarct dementia 6. Skin/Wound Care: Routine skin checks   7. Fluids/Electrolytes/Nutrition: encourage PO, +880 yesterday, eating 100% 8.  Hyperlipidemia: Continue Lipitor 9.  Tobacco abuse.  NicoDerm patch.  Provide counseling 10. Incontinent of bowel and bladder?-   Several bowel movements after disimpaction 11. Hx of urinary frequency- not sure of underlying cause- heard from daughter, however could have been just her smoke break based on hx.  PVRs with low residuals.  Continent of bladder   -time voids 12.  History of hypertension: Not on any meds at present  Controlled 4/6 Vitals:   08/16/19 1920 08/17/19 0459  BP: 121/69 120/70  Pulse: 82 69  Resp:    Temp: 98 F (36.7 C) 97.7 F (36.5 C)  SpO2: 97% 95%   13. Constipation improved on Miralax BID, remains continent  LOS: 12 days A FACE TO FACE EVALUATION WAS PERFORMED  Charlett Blake 08/17/2019, 8:12 AM

## 2019-08-18 ENCOUNTER — Ambulatory Visit (HOSPITAL_COMMUNITY): Payer: PRIVATE HEALTH INSURANCE | Admitting: Physical Therapy

## 2019-08-18 ENCOUNTER — Inpatient Hospital Stay (HOSPITAL_COMMUNITY): Payer: PRIVATE HEALTH INSURANCE | Admitting: Occupational Therapy

## 2019-08-18 ENCOUNTER — Encounter (HOSPITAL_COMMUNITY): Payer: PRIVATE HEALTH INSURANCE | Admitting: Speech Pathology

## 2019-08-18 ENCOUNTER — Inpatient Hospital Stay (HOSPITAL_COMMUNITY): Payer: PRIVATE HEALTH INSURANCE | Admitting: Physical Therapy

## 2019-08-18 ENCOUNTER — Encounter (HOSPITAL_COMMUNITY): Payer: PRIVATE HEALTH INSURANCE | Admitting: Occupational Therapy

## 2019-08-18 NOTE — Plan of Care (Signed)
  Problem: RH BOWEL ELIMINATION Goal: RH STG MANAGE BOWEL WITH ASSISTANCE Description: STG Manage Bowel with min Assistance. Outcome: Progressing   Problem: RH BLADDER ELIMINATION Goal: RH STG MANAGE BLADDER WITH ASSISTANCE Description: STG Manage Bladder With min Assistance Outcome: Progressing   Problem: RH SAFETY Goal: RH STG ADHERE TO SAFETY PRECAUTIONS W/ASSISTANCE/DEVICE Description: STG Adhere to Safety Precautions With cues and reminders Outcome: Progressing   Problem: RH COGNITION-NURSING Goal: RH STG USES MEMORY AIDS/STRATEGIES W/ASSIST TO PROBLEM SOLVE Description: STG Uses Memory Aids/Strategies With min Assistance to Problem Solve. Outcome: Progressing   Problem: RH PAIN MANAGEMENT Goal: RH STG PAIN MANAGED AT OR BELOW PT'S PAIN GOAL Description: Pain level 3 on scale of 0-10 Outcome: Progressing

## 2019-08-18 NOTE — Progress Notes (Signed)
Physical Therapy Session Note  Patient Details  Name: Yolanda Hopkins MRN: [id number] Date of Birth: 07/15/1940  Today's Date: 08/18/2019 PT Individual Time: 9179-1505 amd 1500-1530 PT Individual Time Calculation (min): 40 min and 30 min  Short Term Goals: Week 2:  PT Short Term Goal 1 (Week 2): STG=LTG due to ELOS  Skilled Therapeutic Interventions/Progress Updates: Tx1: Pt presented in recliner not resistive to therapy this am. Pt indicated had not been to bathroom since had gotten up. Performed STS with CGA from recliner and ambulated to toilet with close S. Performed toilet transfers with supervision. Pt noted to have wet brief and PTA removed brief/pants for time management. Pt stood with supervision and use of wall rail and performed peri-care with set up of wet washcloth. PTA threaded brief and pants for time management and pt was able to stand in same manner as prior and complete LB clothing management. Pt ambulated to sink with RW and performed hand hygiene then returned to recliner to eat breakfast. Pt was able to open drinks and eat breakfast with minimal cues for sips after several bites. Once completed breakfast pt agreeable initially to transfer to ADL apt to attempt bed transfer on standard bed. Performed stand pivot transfer to w/c with supervision for STS and CGA for transfer. Once in ADL apt pt was able to transfer to bed via stand pivot CGA overall and sat on bed. Pt then however  began to stand and perseverating on straightening sheet. Despite cues pt unable to complete task of laying down on bed. Pt guided back to w/c and transported back to room. Pt returned to recliner via stand pivot with RW with CGA. Pt left in chair at end of session with seat alarm on, call bell within reach and needs met.    Tx2: Pt presented in bed with son and SIL present. Session focused on family ed regarding transfers, safety with RW, and gait. Per sons had been ambulating pt with RW in room previously  but had not been checked off. Discussed with pt use of decreased cues to minimize frustration/agitation. Pt impulsively attempted to get OOB to use bathroom. Pt was able to follow cues from son to wait until alarm cleared and PTA demonstrated keeping RW away from PT to force pt to use BUE to push up from bed. Pt's son ambulated pt to toilet and provided close S for toilet transfers as pt performed peri-care supervision assist. Pt ambulated back to room and PTA supervised pt returning to bed at Neoma Laming arrived to discuss DME with caregivers. Caregivers demonstrated good understanding of safety with son and signed off to perform ambulation to bathroom in room. PTA answered any additional queries from caregivers and left with pt in bed, and caregivers speaking with LSW.      Therapy Documentation Precautions:  Precautions Precautions: Fall Restrictions Weight Bearing Restrictions: No General:   Vital Signs:   Pain:   Mobility:   Locomotion :    Trunk/Postural Assessment :    Balance:   Exercises:   Other Treatments:      Therapy/Group: Individual Therapy  Braydn Carneiro 08/18/2019, 1:01 PM

## 2019-08-18 NOTE — Discharge Summary (Signed)
Physician Discharge Summary  Patient ID: Yolanda Hopkins MRN: [id number] DOB/AGE: [id number] y.o.  Admit date: 08/05/2019 Discharge date: 08/20/2019  Discharge Diagnoses:  Principal Problem:   Acute ischemic left ACA stroke (HCC) Active Problems:   History of hypertension   Urinary frequency   Urinary incontinence   Incomplete defecation   Slow transit constipation DVT prophylaxis Tobacco abuse Hyperlipidemia Multiinfarction dementia  Discharged Condition: Stable  Significant Diagnostic Studies: CT ANGIO HEAD W OR WO CONTRAST  Result Date: 07/31/2019 CLINICAL DATA:  Stroke follow-up EXAM: CT ANGIOGRAPHY HEAD AND NECK TECHNIQUE: Multidetector CT imaging of the head and neck was performed using the standard protocol during bolus administration of intravenous contrast. Multiplanar CT image reconstructions and MIPs were obtained to evaluate the vascular anatomy. Carotid stenosis measurements (when applicable) are obtained utilizing NASCET criteria, using the distal internal carotid diameter as the denominator. CONTRAST:  75mL OMNIPAQUE IOHEXOL 350 MG/ML SOLN COMPARISON:  None. FINDINGS: CTA NECK FINDINGS SKELETON: There is no bony spinal canal stenosis. No lytic or blastic lesion. OTHER NECK: Normal pharynx, larynx and major salivary glands. No cervical lymphadenopathy. Unremarkable thyroid gland. UPPER CHEST: No pneumothorax or pleural effusion. No nodules or masses. AORTIC ARCH: There is no calcific atherosclerosis of the aortic arch. There is no aneurysm, dissection or hemodynamically significant stenosis of the visualized portion of the aorta. Conventional 3 vessel aortic branching pattern. The visualized proximal subclavian arteries are widely patent. RIGHT CAROTID SYSTEM: Normal without aneurysm, dissection or stenosis. LEFT CAROTID SYSTEM: No dissection, occlusion or aneurysm. There is mixed density atherosclerosis extending into the proximal ICA, resulting in 50% stenosis. VERTEBRAL  ARTERIES: Codominant configuration. Both origins are clearly patent. There is no dissection, occlusion or flow-limiting stenosis to the skull base (V1-V3 segments). CTA HEAD FINDINGS POSTERIOR CIRCULATION: --Vertebral arteries: Normal V4 segments. --Posterior inferior cerebellar arteries (PICA): Patent origins from the vertebral arteries. --Anterior inferior cerebellar arteries (AICA): Patent origins from the basilar artery. --Basilar artery: Normal. --Superior cerebellar arteries: Normal. --Posterior cerebral arteries: Normal. Both originate from the basilar artery. Posterior communicating arteries (p-comm) are diminutive or absent. ANTERIOR CIRCULATION: --Intracranial internal carotid arteries: Normal. --Anterior cerebral arteries (ACA): Normal. Absent left A1 segment, normal variant --Middle cerebral arteries (MCA): Normal. VENOUS SINUSES: As permitted by contrast timing, patent. ANATOMIC VARIANTS: Absent left A1 segment of the anterior cerebral arteries. Review of the MIP images confirms the above findings. IMPRESSION: 1. No intracranial arterial occlusion or high-grade stenosis. 2. 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 Electronically Signed   By: Deatra RobinsonKevin  Herman M.D.   On: 07/31/2019 04:58   CT HEAD WO CONTRAST  Result Date: 08/09/2019 CLINICAL DATA:  Recent fall, history of prior stroke EXAM: CT HEAD WITHOUT CONTRAST TECHNIQUE: Contiguous axial images were obtained from the base of the skull through the vertex without intravenous contrast. COMPARISON:  CT from 07/30/2019, MRI from 07/30/2019 FINDINGS: Brain: No findings to suggest acute hemorrhage, acute infarction or space-occupying mass lesion are noted. There are areas of decreased attenuation in the left deep white matter similar to that seen on prior MRI consistent with prior infarct. Changes of prior basal ganglia infarcts are noted as well. No other focal abnormality is noted. Vascular: No hyperdense vessel or  unexpected calcification. Skull: Normal. Negative for fracture or focal lesion. Sinuses/Orbits: Orbits and their contents are within normal limits. Mild mucosal changes are noted within the ethmoid sinuses similar to that noted on the prior exam. Other: None IMPRESSION: Changes consistent with prior infarcts as  described. No acute abnormality noted. Electronically Signed   By: [name] Catalina M.D.   On: 08/09/2019 21:30   CT Head Wo Contrast  Result Date: 07/30/2019 CLINICAL DATA:  Weakness EXAM: CT HEAD WITHOUT CONTRAST TECHNIQUE: Contiguous axial images were obtained from the base of the skull through the vertex without intravenous contrast. COMPARISON:  CT brain 08/19/2013 FINDINGS: Brain: No acute territorial infarction, hemorrhage, or intracranial mass. Mild to moderate atrophy. Moderate hypodensity in the white matter consistent with chronic small vessel ischemic change. Chronic lacunar infarcts within the bilateral basal ganglia. Stable ventricle size Vascular: No hyperdense vessels.  Carotid vascular calcification Skull: Normal. Negative for fracture or focal lesion. Sinuses/Orbits: Mucosal thickening in the sinuses. Sclerosis and bony wall thickening of the left sphenoid sinus consistent with chronic disease. Other: None IMPRESSION: 1. No CT evidence for acute intracranial abnormality. 2. Atrophy and chronic small vessel ischemic change of the white matter Electronically Signed   By: Donavan Foil M.D.   On: 07/30/2019 20:35   CT ANGIO NECK W OR WO CONTRAST  Result Date: 07/31/2019 CLINICAL DATA:  Stroke follow-up EXAM: CT ANGIOGRAPHY HEAD AND NECK TECHNIQUE: Multidetector CT imaging of the head and neck was performed using the standard protocol during bolus administration of intravenous contrast. Multiplanar CT image reconstructions and MIPs were obtained to evaluate the vascular anatomy. Carotid stenosis measurements (when applicable) are obtained utilizing NASCET criteria, using the distal internal  carotid diameter as the denominator. CONTRAST:  22mL OMNIPAQUE IOHEXOL 350 MG/ML SOLN COMPARISON:  None. FINDINGS: CTA NECK FINDINGS SKELETON: There is no bony spinal canal stenosis. No lytic or blastic lesion. OTHER NECK: Normal pharynx, larynx and major salivary glands. No cervical lymphadenopathy. Unremarkable thyroid gland. UPPER CHEST: No pneumothorax or pleural effusion. No nodules or masses. AORTIC ARCH: There is no calcific atherosclerosis of the aortic arch. There is no aneurysm, dissection or hemodynamically significant stenosis of the visualized portion of the aorta. Conventional 3 vessel aortic branching pattern. The visualized proximal subclavian arteries are widely patent. RIGHT CAROTID SYSTEM: Normal without aneurysm, dissection or stenosis. LEFT CAROTID SYSTEM: No dissection, occlusion or aneurysm. There is mixed density atherosclerosis extending into the proximal ICA, resulting in 50% stenosis. VERTEBRAL ARTERIES: Codominant configuration. Both origins are clearly patent. There is no dissection, occlusion or flow-limiting stenosis to the skull base (V1-V3 segments). CTA HEAD FINDINGS POSTERIOR CIRCULATION: --Vertebral arteries: Normal V4 segments. --Posterior inferior cerebellar arteries (PICA): Patent origins from the vertebral arteries. --Anterior inferior cerebellar arteries (AICA): Patent origins from the basilar artery. --Basilar artery: Normal. --Superior cerebellar arteries: Normal. --Posterior cerebral arteries: Normal. Both originate from the basilar artery. Posterior communicating arteries (p-comm) are diminutive or absent. ANTERIOR CIRCULATION: --Intracranial internal carotid arteries: Normal. --Anterior cerebral arteries (ACA): Normal. Absent left A1 segment, normal variant --Middle cerebral arteries (MCA): Normal. VENOUS SINUSES: As permitted by contrast timing, patent. ANATOMIC VARIANTS: Absent left A1 segment of the anterior cerebral arteries. Review of the MIP images confirms the  above findings. IMPRESSION: 1. No intracranial arterial occlusion or high-grade stenosis. 2. 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 Electronically Signed   By: Ulyses Jarred M.D.   On: 07/31/2019 04:58   MR BRAIN WO CONTRAST  Result Date: 07/30/2019 CLINICAL DATA:  Dementia and difficulty walking EXAM: MRI HEAD WITHOUT CONTRAST TECHNIQUE: Multiplanar, multiecho pulse sequences of the brain and surrounding structures were obtained without intravenous contrast. The examination had to be discontinued prior to completion due to the patient's altered mental status and inability  to cooperate with technologist's instructions. Eight series are provided for interpretation. COMPARISON:  Brain MRI 08/19/2013 FINDINGS: Brain: Multifocal abnormal diffusion restriction within the left frontal white matter, within the anterior cerebral artery territory. Punctate focus of hyperintensity on diffusion-weighted imaging (axial) in the right cerebellar hemisphere may be artifactual. Multifocal white matter hyperintensity, most commonly due to chronic ischemic microangiopathy. There are multiple old small vessel infarcts of the basal ganglia and cerebellum. There is generalized atrophy without lobar predilection. There are multiple chronic microhemorrhages concentrated within the left parietal lobe. Vascular: Normal flow voids. Skull and upper cervical spine: Normal marrow signal. Sinuses/Orbits: Negative. Other: None. IMPRESSION: 1. Multiple small acute infarcts within the left frontal white matter, in the left anterior cerebral artery territory. 2. Punctate focus of hyperintensity on diffusion-weighted imaging in the right cerebellar hemisphere may be artifactual. 3. Generalized atrophy, without lobar predilection. 4. 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 5. Multiple chronic microhemorrhages concentrated within the left parietal lobe. Electronically Signed   By:  Deatra Robinson M.D.   On: 07/30/2019 23:41   ECHOCARDIOGRAM COMPLETE  Result Date: 08/01/2019    ECHOCARDIOGRAM REPORT   Patient Name:   Yolanda Hopkins Date of Exam: 08/01/2019 Medical Rec #:  [id number]        Height:       64.0 in Accession #:    [id number]       Weight:       95.9 lb Date of Birth:  [birth year]        BSA:          1.431 m Patient Age:    79 years         BP:           153/82 mmHg Patient Gender: F                HR:           74 bpm. Exam Location:  Inpatient Procedure: 2D Echo, Color Doppler and Cardiac Doppler Indications:    Stroke i163.9  History:        Patient has prior history of Echocardiogram examinations, most                 recent 08/20/2013. Risk Factors:Hypertension and Dyslipidemia.  Sonographer:    Irving Burton Senior RDCS Referring Phys: [id number] ARSHAD N KAKRAKANDY IMPRESSIONS  1. Left ventricular ejection fraction, by estimation, is 55 to 60%. The left ventricle has normal function. The left ventricle has no regional wall motion abnormalities. Left ventricular diastolic parameters are consistent with Grade I diastolic dysfunction (impaired relaxation).  2. Right ventricular systolic function is normal. The right ventricular size is normal. There is normal pulmonary artery systolic pressure. The estimated right ventricular systolic pressure is 21.0 mmHg.  3. The mitral valve is grossly normal. Trivial mitral valve regurgitation.  4. The aortic valve is tricuspid. Aortic valve regurgitation is not visualized. Mild aortic valve sclerosis is present, with no evidence of aortic valve stenosis. FINDINGS  Left Ventricle: Left ventricular ejection fraction, by estimation, is 55 to 60%. The left ventricle has normal function. The left ventricle has no regional wall motion abnormalities. The left ventricular internal cavity size was normal in size. There is  borderline left ventricular hypertrophy. Left ventricular diastolic parameters are consistent with Grade I diastolic dysfunction (impaired  relaxation). Right Ventricle: The right ventricular size is normal. No increase in right ventricular wall thickness. Right ventricular systolic function is normal. There is  normal pulmonary artery systolic pressure. The tricuspid regurgitant velocity is 2.12 m/s, and  with an assumed right atrial pressure of 3 mmHg, the estimated right ventricular systolic pressure is 21.0 mmHg. Left Atrium: Left atrial size was normal in size. Right Atrium: Right atrial size was normal in size. Pericardium: There is no evidence of pericardial effusion. Presence of pericardial fat pad. Mitral Valve: The mitral valve is grossly normal. Mild mitral annular calcification. Trivial mitral valve regurgitation. Tricuspid Valve: The tricuspid valve is grossly normal. Tricuspid valve regurgitation is trivial. Aortic Valve: The aortic valve is tricuspid. Aortic valve regurgitation is not visualized. Mild aortic valve sclerosis is present, with no evidence of aortic valve stenosis. Mild aortic valve annular calcification. Pulmonic Valve: The pulmonic valve was grossly normal. Pulmonic valve regurgitation is trivial. Aorta: The aortic root is normal in size and structure. IAS/Shunts: The interatrial septum appears to be lipomatous. No atrial level shunt detected by color flow Doppler.  LEFT VENTRICLE PLAX 2D LVIDd:         3.30 cm  Diastology LVIDs:         2.40 cm  LV e' lateral:   6.20 cm/s LV PW:         0.80 cm  LV E/e' lateral: 10.2 LV IVS:        1.00 cm  LV e' medial:    5.22 cm/s LVOT diam:     1.80 cm  LV E/e' medial:  12.1 LV SV:         67 LV SV Index:   47 LVOT Area:     2.54 cm  RIGHT VENTRICLE RV S prime:     13.80 cm/s TAPSE (M-mode): 1.6 cm LEFT ATRIUM             Index       RIGHT ATRIUM           Index LA diam:        2.30 cm 1.61 cm/m  RA Area:     16.20 cm LA Vol (A2C):   51.6 ml 36.05 ml/m RA Volume:   47.20 ml  32.97 ml/m LA Vol (A4C):   31.9 ml 22.28 ml/m LA Biplane Vol: 42.1 ml 29.41 ml/m  AORTIC VALVE LVOT Vmax:    119.00 cm/s LVOT Vmean:  81.600 cm/s LVOT VTI:    0.263 m  AORTA Ao Root diam: 3.00 cm MITRAL VALVE               TRICUSPID VALVE MV Area (PHT): 2.26 cm    TR Peak grad:   18.0 mmHg MV Decel Time: 335 msec    TR Vmax:        212.00 cm/s MV E velocity: 63.00 cm/s MV A velocity: 86.10 cm/s  SHUNTS MV E/A ratio:  0.73        Systemic VTI:  0.26 m                            Systemic Diam: 1.80 cm Nona Dell MD Electronically signed by Nona Dell MD Signature Date/Time: 08/01/2019/11:40:08 AM    Final    DG Hip Unilat W or Wo Pelvis 2-3 Views Right  Result Date: 07/30/2019 CLINICAL DATA:  Fall, leg injury EXAM: DG HIP (WITH OR WITHOUT PELVIS) 2-3V RIGHT COMPARISON:  None. FINDINGS: The osseous structures appear diffusely demineralized which may limit detection of small or nondisplaced fractures. Bones of the pelvis appear intact and congruent. A  right hip hemiarthroplasty appears normally aligned without evidence of periprosthetic fracture, hardware failure or lucency. Mild right hip swelling. Left femoral head is normally located. No proximal left femoral fracture is seen. Vascular calcium noted in the pelvis. Multilevel degenerative changes are present in the imaged portions of the spine. Additional degenerative changes in the SI joints and symphysis pubis IMPRESSION: Mild right hip soft tissue swelling. No acute osseous abnormality or evidence acute prosthetic complication of the patient's right hip hemiarthroplasty. Electronically Signed   By: Kreg Shropshire M.D.   On: 07/30/2019 20:51    Labs:  Basic Metabolic Panel: Recent Labs  Lab 08/19/19 0522  CREATININE 0.78    CBC: No results for input(s): WBC, NEUTROABS, HGB, HCT, MCV, PLT in the last 168 hours.  CBG: No results for input(s): GLUCAP in the last 168 hours. Family history.  Mother with hypertension.  Father hypertension.  Father liver cancer.  Denies any diabetes mellitus colon cancer or rectal cancer  Brief HPI:   Yolanda Hopkins is a 79 y.o. right-handed female with history of hypertension, depression, hyperlipidemia, tobacco abuse and vertigo.  Presented 07/31/2019 with right-sided weakness as well as falls x2 with altered mental status.  Cranial CT scan negative for acute changes.  Patient did not receive TPA.  MRI of the brain showed multiple small acute infarcts within the left frontal white matter in the left anterior cerebral artery territory.  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  CT angiogram of head and neck no intracranial arterial occlusion or high-grade stenosis.  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  Echocardiogram with ejection fraction of 60%.  Admission chemistries unremarkable SARS coronavirus negative.  Neurology follow-up maintained on aspirin 325 mg and Plavix for CVA prophylaxis x3 months and aspirin alone due to severe left ACA stenosis.  Subcutaneous Lovenox for DVT prophylaxis.  Patient was admitted for a comprehensive rehab program.   Hospital Course: Yolanda Hopkins was admitted to rehab 08/05/2019 for inpatient therapies to consist of PT, ST and OT at least three hours five days a week. Past admission physiatrist, therapy team and rehab RN have worked together to provide customized collaborative inpatient rehab.  Pertaining to patient left ACA scattered infarcts in the left frontal lobe remained stable she continued on aspirin 325 mg daily and Plavix 75 mg daily x3 months then aspirin alone due to severe left ACA stenosis.  She would follow-up neurology services.  Subcutaneous Lovenox for DVT prophylaxis no bleeding episodes.  Mood stabilization cueing for safety she continued on low-dose Remeron she did have a history of memory loss.  Lipitor for hyperlipidemia.  History of tobacco abuse NicoDerm patch patient family received counts regards to cessation of nicotine products.  Blood pressure is currently controlled on no  antihypertensive medication she would follow-up with her primary MD.  Bouts of constipation resolved with laxative assistance.   Blood pressures were monitored on TID basis and controlled  She is continent of bowel and bladder.  She has made gains during rehab stay and is attending therapies Yolanda Hopkins will continue to receive follow up therapies   after discharge  Rehab course: During patient's stay in rehab weekly team conferences were held to monitor patient's progress, set goals and discuss barriers to discharge. At admission, patient required moderate assist supine to sit.  Moderate assist sit to stand.  Moderate assist 5 feet rolling walker.  Moderate assist toilet transfers moderate assist functional mobility  ADLs  Physical exam.  Blood pressure 128/60 pulse 80 temperature 98 respirations 16 oxygen saturation 96% room air Constitutional alert no acute distress HEENT Head.  Normocephalic and atraumatic Eyes.  Pupils round and reactive to light no discharge without nystagmus Neck.  Supple nontender no JVD without thyromegaly Cardiac regular rate rhythm without any extra sounds or murmur heard Abdomen.  Soft nontender positive bowel sounds without rebound Respiratory effort normal no respiratory distress without wheeze Musculoskeletal.  Normal range of motion Comments.  Left upper extremity 5- out of 5 deltoids biceps triceps wrist extension grip and finger abduction Right upper extremity 4 out of 5 in all same muscles Left lower extremity hip flexion 4 out of 5 otherwise 5- out of 5 Left lower extremity hip flexion 2 out of 5 knee extension knee flexion 4- out of 5 dorsi flexion 3 out of 5 plantar flexion 4- out of 5 Neurological patient alert no acute distress mood was a bit flat made good eye contact some delay in processing very slowed processing responses.   Yolanda Hopkins  has had improvement in activity tolerance, balance, postural control as well as ability to compensate for deficits. Yolanda Hopkins  has had improvement in functional use RUE/LUE  and RLE/LLE as well as improvement in awareness.  Performed STS from recliner with minimal assist and cues for hand placement.  Ambulates to bathroom with step pattern perform toilet transfers contact-guard assist.  Ambulate to sink perform hand hygiene and standing.  She can ambulate up to 150 feet rolling walker with step through pattern assistive device.  She needed some encouragement times to participate but overall made good functional gains functional ability to the bathroom with use of rolling walker assistance for walker management.  Toileting hygiene clothing management contact-guard minimal assist.  Again needing ongoing cues for safety.  Patient required minimal assist to use a calendar to reorient to date a week and date.  Moderate assist visual and verbal cues required.  Again discussed at length with family the need for supervision for safety.  Teaching completed and discharged home       Disposition: Discharge to home    Diet: Regular  Special Instructions: No driving smoking or alcohol  Continue aspirin 325 mg daily and Plavix 75 mg daily x3 months then aspirin alone  Medications at discharge 1.  Tylenol as needed 2.  Aspirin 325 mg p.o. daily 3.  Lipitor 40 mg p.o. daily 4.  Plavix and 5 mg p.o. daily 5.  Remeron 15 mg p.o. nightly 6.  NicoDerm patch taper as directed 7.Isopto tears 1 drop both eyes as needed  Discharge Instructions    Ambulatory referral to Neurology   Complete by: As directed    An appointment is requested in approximately 4 weeks left ACA scattered infarction   Ambulatory referral to Physical Medicine Rehab   Complete by: As directed    Moderate complexity follow-up 1 to 2 weeks left ACA scattered infarction   Ambulatory referral to Podiatry   Complete by: As directed    Ambulatory referral for nail debridements      Follow-up Information    Kirsteins, Victorino Sparrow, MD Follow up.   Specialty:  Physical Medicine and Rehabilitation Why: Office to call for appointment Contact information: [address] [place] [address] [place] [address] [phone]           Signed: Charlton Amor 08/20/2019, 5:02 AM

## 2019-08-18 NOTE — Plan of Care (Signed)
  Problem: RH BLADDER ELIMINATION Goal: RH STG MANAGE BLADDER WITH ASSISTANCE Description: STG Manage Bladder With min Assistance Outcome: Progressing   Problem: RH BOWEL ELIMINATION Goal: RH STG MANAGE BOWEL WITH ASSISTANCE Description: STG Manage Bowel with min Assistance. Outcome: Progressing   Problem: Consults Goal: RH STROKE PATIENT EDUCATION Description: See Patient Education module for education specifics  Outcome: Progressing   Problem: RH SAFETY Goal: RH STG ADHERE TO SAFETY PRECAUTIONS W/ASSISTANCE/DEVICE Description: STG Adhere to Safety Precautions With cues and reminders Outcome: Progressing   Problem: RH PAIN MANAGEMENT Goal: RH STG PAIN MANAGED AT OR BELOW PT'S PAIN GOAL Description: Pain level 3 on scale of 0-10 Outcome: Progressing

## 2019-08-18 NOTE — Progress Notes (Signed)
Occupational Therapy Session Note  Patient Details  Name: Yolanda Hopkins MRN: [id number] Date of Birth: 11/21/1940  Today's Date: 08/18/2019 OT Individual Time: 1025-8527 and 7824-2353 OT Individual Time Calculation (min): 40 min and 42 mins   Short Term Goals: Week 2:  OT Short Term Goal 1 (Week 2): STGs=LTGs due to ELOS  Skilled Therapeutic Interventions/Progress Updates:    Session 1: Pt received at RN stations and verbalized needing to use the bathroom when asked. OT assisted pt back to room via recliner chair. Pt standing with CGA and ambulating with CGA without use of AD into bathroom for toileting needs. Pt performing clothing management and hygiene with increased time and CGA for standing balance. Pt initially refusing bathing and dressing when asked. Pt exiting the bathroom and performed hand hygiene with increased time and min cuing for sequencing. Pt returning to sit in recliner chair with presented with clean clothing. Pt refusing bathing but agreeable to dressing tasks. Pt needing min cuing for safety awareness to sit for safety with dressing tasks. Pt donning button up shirt with set up A this session. Pt able to thread pants over B feet and pull over B hips with CGA for balance. Pt requesting to return to bed with CGA transfer. Pt appearing more restless and OT playing several Elvis songs with pt singing along. Pt requesting and drinking several sips of coke as well. Pt remained in bed with call bell within reach and bed alarm activated. Music turned off per pt's request as therapist exiting the room.   Session 2:  Upon entering the room, pt supine in bed with family present for family education. OT educating caregivers on pt's progress towards goals and recommendations for at home with equipment needs, 24/7 S and CGA with mobility and self care tasks. Caregivers have baby monitor in place they can use at home during the night for safety. OT taking family to tub room to demonstrate use  of tub transfer bench for bathing tasks to increase safety. They agreed she needed this as well as BSC. OT also recommending timed toileting at home to decrease behaviors and meet needs. Discussion of behaviors with family members and recommendation to use music as a way to calm pt. OT answering all of caregivers questions with no further concerns at this time. Pt remains in bed with bed alarm activated and call bell within reach.   Therapy Documentation Precautions:  Precautions Precautions: Fall Restrictions Weight Bearing Restrictions: No Pain: Pain Assessment Pain Scale: 0-10 Pain Score: 0-No pain ADL: ADL Eating: Not assessed Grooming: Moderate assistance Where Assessed-Grooming: Standing at sink Upper Body Bathing: Supervision/safety Where Assessed-Upper Body Bathing: Sitting at sink Lower Body Bathing: Moderate assistance Where Assessed-Lower Body Bathing: Standing at sink, Sitting at sink Upper Body Dressing: Moderate assistance Where Assessed-Upper Body Dressing: Sitting at sink Lower Body Dressing: Maximal assistance Where Assessed-Lower Body Dressing: Standing at sink, Sitting at sink Toileting: Not assessed Toilet Transfer: Moderate assistance Toilet Transfer Method: Stand pivot Toilet Transfer Equipment: Grab bars Tub/Shower Transfer: Not assessed   Therapy/Group: Individual Therapy  Alen Bleacher 08/18/2019, 12:14 PM

## 2019-08-18 NOTE — Progress Notes (Addendum)
[facility] PHYSICAL MEDICINE & REHABILITATION PROGRESS NOTE   Subjective/Complaints:  Patient states"hello Mr. 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 ROS: Limited due to cognitive/behavioral   Objective:   No results found. No results for input(s): WBC, HGB, HCT, PLT in the last 72 hours. No results for input(s): NA, K, CL, CO2, GLUCOSE, BUN, CREATININE, CALCIUM in the last 72 hours. No intake or output data in the 24 hours ending 08/18/19 0906   Physical Exam: Vital Signs Blood pressure 134/68, pulse 63, temperature 97.8 F (36.6 C), temperature source Oral, resp. rate 16, height 5' 4.5" (1.638 m), weight 53.8 kg, SpO2 98 %.      General: No acute distress Mood and affect are appropriate Heart: Regular rate and rhythm no rubs murmurs or extra sounds Lungs: Clear to auscultation, breathing unlabored, no rales or wheezes Abdomen: Positive bowel sounds, soft nontender to palpation, nondistended Extremities: No clubbing, cyanosis, or edema Skin: No evidence of breakdown, no evidence of rash Neurologic motor strength is 5/5 in bilateral deltoid, bicep, tricep, grip, 4/5 right and 5/5 left hip flexor, knee extensors, ankle dorsiflexor and plantar flexor No signs of agitation    Assessment/Plan: 1. 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  Physiatrist is providing close team supervision and 24 hour management of active medical problems listed below.  Physiatrist and rehab team continue to assess barriers to discharge/monitor patient progress toward functional and medical goals  Care Tool:  Bathing    Body parts bathed by patient: Right arm, Left arm, Chest, Abdomen, Front perineal area, Buttocks, Right upper leg, Left upper leg, Face   Body parts bathed by helper: Right lower leg, Left  lower leg     Bathing assist Assist Level: Moderate Assistance - Patient 50 - 74%     Upper Body Dressing/Undressing Upper body dressing   What is the patient wearing?: Pull over shirt    Upper body assist Assist Level: Set up assist    Lower Body Dressing/Undressing Lower body dressing      What is the patient wearing?: Pants     Lower body assist Assist for lower body dressing: Moderate Assistance - Patient 50 - 74%     Toileting Toileting Toileting Activity did not occur Landscape architect and hygiene only): Refused  Toileting assist Assist for toileting: Supervision/Verbal cueing     Transfers Chair/bed transfer  Transfers assist     Chair/bed transfer assist level: Minimal Assistance - Patient > 75%     Locomotion Ambulation   Ambulation assist      Assist level: Contact Guard/Touching assist Assistive device: Walker-rolling Max distance: 65f   Walk 10 feet activity   Assist  Walk 10 feet activity did not occur: Safety/medical concerns  Assist level: Contact Guard/Touching assist Assistive device: Walker-rolling   Walk 50 feet activity   Assist Walk 50 feet with 2 turns activity did not occur: Safety/medical concerns  Assist level: Minimal Assistance - Patient > 75% Assistive device: Walker-rolling    Walk 150 feet activity   Assist Walk 150 feet activity did not occur: Safety/medical concerns         Walk 10 feet on uneven surface  activity   Assist Walk 10 feet on uneven surfaces activity did not occur: Safety/medical concerns         Wheelchair     Assist  Will patient use wheelchair at discharge?: Yes Type of Wheelchair: Manual    Wheelchair assist level: Minimal Assistance - Patient > 75% Max wheelchair distance: 75    Wheelchair 50 feet with 2 turns activity    Assist        Assist Level: Minimal Assistance - Patient > 75%   Wheelchair 150 feet activity     Assist      Assist Level:  Moderate Assistance - Patient 50 - 74%   Blood pressure 134/68, pulse 63, temperature 97.8 F (36.6 C), temperature source Oral, resp. rate 16, height 5' 4.5" (1.638 m), weight 53.8 kg, SpO2 98 %.   Medical Problem List and Plan: 1.  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 Formulized individual treatment plan based on medical history, underlying problem and comorbidities. 2.  Antithrombotics: -DVT/anticoagulation: Lovenox  CBC within normal limits on 3/26             -antiplatelet therapy: Aspirin 325 mg daily and Plavix 75 mg daily x3 months then aspirin alone due to severe left ACA stenosis 3. Pain Management: Tylenol as needed 4. Mood/history of memory loss.  Remeron 7.5 mg nightly, increase to 15 mg             -antipsychotic agents: N/A 5. 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 Skin/Wound Care: Routine skin checks   7. Fluids/Electrolytes/Nutrition: encourage PO, +880 yesterday, eating 100% 8.  Hyperlipidemia: Continue Lipitor 9.  Tobacco abuse.  NicoDerm patch.  Provide counseling 10. Incontinent of bowel and bladder?-   Several bowel movements after disimpaction 11. Hx of urinary frequency- not sure of underlying cause- heard from daughter, however could have been just her smoke break based on hx.  PVRs with low residuals.  Continent of bladder   -time voids 12.  History of hypertension: Not on any meds at present  Controlled 4/7 Vitals:   08/17/19 1930 08/18/19 0529  BP: 127/66 134/68  Pulse: 73 63  Resp:  16  Temp: 98 F (36.7 C) 97.8 F (36.6 C)  SpO2: 99% 98%   13. Constipation improved on Miralax  BID, last bowel movement 08/16/2019 LOS: 13 days A FACE TO [place] E Kierstan Auer 08/18/2019, 9:06 AM

## 2019-08-18 NOTE — Progress Notes (Signed)
Speech Language Pathology Daily Session Note  Patient Details  Name: Yolanda Hopkins MRN: [id number] Date of Birth: 1941/04/13  Today's Date: 08/18/2019 SLP Individual Time: 1410-1440 SLP Individual Time Calculation (min): 30 min  Short Term Goals: Week 2: SLP Short Term Goal 1 (Week 2): STG=LTG due to remaining LOS  Skilled Therapeutic Interventions: Pt/family were seen for education in preparation for DC 4/9. Daughter Elita Quick, son Thayer Ohm and Thayer Ohm' partner Nadine Counts were present during this education session. SLP reiterated information regarding using environmental cues to maximize orientation, need for close supervision due to impulsivity, decreased attention and poor recall. SLP discussed benefit of establishing and continuing (healthy) routines and minimizing distraction during tasks - especially at mealtimes. Pt will continue to require cues to check/clear pocketing after meals. Family was given the opportunity to ask questions, and they were answered to their satisfaction.   Pain Pain Assessment Pain Scale: 0-10 Pain Score: 0-No pain  Therapy/Group: Individual Therapy  Stephonie Wilcoxen B. Murvin Natal, [facility], CCC-SLP Speech Language Pathologist  Leigh Aurora 08/18/2019, 3:50 PM

## 2019-08-18 NOTE — Patient Care Conference (Signed)
Inpatient RehabilitationTeam Conference and Plan of Care Update Date: 08/18/2019   Time: 10:00 AM    Patient Name: Yolanda Hopkins      Medical Record Number: [id number]  Date of Birth: 18-Jul-1940 Sex: Female         Room/Bed: 4W10C/4W10C-01 Payor Info: Payor: MEDICARE / Plan: MEDICARE PART A AND B / Product Type: *No Product type* /    Admit Date/Time:  08/05/2019  2:40 PM  Primary Diagnosis:  Acute ischemic left ACA stroke [facility])  Patient Active Problem List   Diagnosis Date Noted  . Incomplete defecation   . Slow transit constipation   . History of hypertension   . Urinary frequency   . Urinary incontinence   . Acute CVA (cerebrovascular accident) (HCC) 07/30/2019  . GI bleed 05/22/2016  . Pressure injury of skin 05/22/2016  . Loss of weight   . Dyslipidemia   . 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 HTN (hypertension) 08/19/2013  . Tobacco abuse 08/19/2013  . Vertigo 08/19/2013  . Gait abnormality 08/19/2013  . CVA (cerebral infarction) 08/19/2013    Expected Discharge Date: Expected Discharge Date: 08/20/19  Team Members Present: Physician leading conference: Dr. Claudette Laws Care Coodinator Present: Roderic Palau, RN, MSN;Deborah Cedric Fishman, RN, BSN, CRRN Nurse Present: Harle Battiest, RN PT Present: Grier Rocher, PT OT Present: Jackquline Denmark, OT SLP Present: Suzzette Righter, CF-SLP PPS Coordinator present : Edson Snowball, PT     Current Status/Progress Goal Weekly Team Focus  Bowel/Bladder   pt is incontinent of B/B LBM 04/05  regain continence of B/B normal bowel pattern  timed toileting laxatives prn   Swallow/Nutrition/ Hydration   Regular solids (upgraded), thin liquids, full supervision due to cognition  Mod I  carryover swallow strategies, education   ADL's   CGA ambulatory toilet transfers using RW, CGA toileting tasks, Supervision UB dressing, Mod A LB dressing, bathing was not completed during this report period  CGA overall  Self care  retraining, functional cognition, motor planning, family education, safety awareness   Mobility   supervsion bed mobility, minA transfers, CGA to minA gait with RW poor safety awareness with RW.  supervision transfers, CGA gait  transfers, balance, cognitive remedication, family ed   Communication   low vocal intensity, intermittent language of confusion, benefits from giving choices to try to help identify her needs  Min  carryover intelligibility strategies, expressing wants and needs, education   Safety/Cognition/ Behavioral Observations  Min-Mod A basic problem solving depending on if it's familiar task or not, initiation, error awareness, orientation with aids  Min A  orienation with aids, initiation, recall, basic familiar problem solving, education   Pain   denies pain  remain pain free  assess pain qshift and prn   Skin   ecchymosis to BUE's Abdomen BLE's  skin remain intact and free of infection  assess skin qshift and prn    Rehab Goals Patient on target to meet rehab goals: Yes *See Care Plan and progress notes for long and short-term goals.     Barriers to Discharge  Current Status/Progress Possible Resolutions Date Resolved   Nursing                  PT                    OT                  SLP  SW Decreased caregiver support;Behavior Son reports he wants to assure mother gets care she needs at discharge            Discharge Planning/Teaching Needs:  Home with hired help when son is at work  MEdications, transfers, toileting, etc   Team Discussion: 79 yo h/o CVA, B BG infarcts, recent L ACA infarct, fall risk.  RN BM 4/6, gets OOB, skin tear R side head.  OT CGA toilet transfer, S UB ADL, min A LB ADL, goals CGA, fam ed today.  PT S bed, S/min transfers, S gait short distance, CGA/min A longer distances, S w/c mobility, goals CGA/S.  SLP reg thins, pockets, min/mod basic prob solving, fam ed today.  Home with son and partner at DC.   Revisions to  Treatment Plan: N/A     Medical Summary Current Status: Multiple strokes, parkinsonism from bilateral basal ganglia infarcts now with left ACA infarct causing right lower extremity weakness and further decline of cognition. Weekly Focus/Goal: Work on Surveyor, mining, decreased fall risk  Barriers to Discharge: Decreased family/caregiver support;Medical stability   Possible Resolutions to Barriers: Family training, toileting program,   Continued Need for Acute Rehabilitation Level of Care: The patient requires daily medical management by a physician with specialized training in physical medicine and rehabilitation for the following reasons: Direction of a multidisciplinary physical rehabilitation program to maximize functional independence : Yes Medical management of patient stability for increased activity during participation in an intensive rehabilitation regime.: Yes Analysis of laboratory values and/or radiology reports with any subsequent need for medication adjustment and/or medical intervention. : Yes   I attest that I was present, lead the team conference, and concur with the assessment and plan of the team.   Yolanda Hopkins 08/18/2019, 3:53 PM   Team conference was held via web/ teleconference due to [place] Part - 19

## 2019-08-19 ENCOUNTER — Inpatient Hospital Stay (HOSPITAL_COMMUNITY): Payer: PRIVATE HEALTH INSURANCE | Admitting: Physical Therapy

## 2019-08-19 ENCOUNTER — Inpatient Hospital Stay (HOSPITAL_COMMUNITY): Payer: PRIVATE HEALTH INSURANCE | Admitting: Occupational Therapy

## 2019-08-19 ENCOUNTER — Inpatient Hospital Stay (HOSPITAL_COMMUNITY): Payer: PRIVATE HEALTH INSURANCE | Admitting: Speech Pathology

## 2019-08-19 LAB — CREATININE, SERUM
Creatinine, Ser: 0.78 mg/dL (ref 0.44–1.00)
GFR calc Af Amer: >60 mL/min (ref >60)
GFR calc non Af Amer: >60 mL/min (ref >60)

## 2019-08-19 MED ORDER — ACETAMINOPHEN 325 MG PO TABS: 650.0000 mg | ORAL_TABLET | ORAL | Status: DC | PRN

## 2019-08-19 MED ORDER — MIRTAZAPINE 15 MG PO TABS: 15.0000 mg | ORAL_TABLET | Freq: Every day | ORAL | 0 refills | Status: DC

## 2019-08-19 MED ORDER — CLOPIDOGREL BISULFATE 75 MG PO TABS: 75.0000 mg | ORAL_TABLET | Freq: Every day | ORAL | 1 refills | Status: DC

## 2019-08-19 MED ORDER — ATORVASTATIN CALCIUM 40 MG PO TABS: 40.0000 mg | ORAL_TABLET | Freq: Every day | ORAL | 0 refills | Status: DC

## 2019-08-19 MED ORDER — NICOTINE 7 MG/24HR TD PT24: MEDICATED_PATCH | TRANSDERMAL | 0 refills | Status: DC

## 2019-08-19 NOTE — Progress Notes (Signed)
Patient noted to have an irregular heart beat this morning. Patient did not have chest pain or shortness of breath, no distress noted. DR Carlis Abbott is aware and stated she would see her now.

## 2019-08-19 NOTE — Progress Notes (Signed)
Occupational Therapy Discharge Summary  Patient Details  Name: Yolanda Hopkins MRN: [id number] Date of Birth: 07/20/1940    Patient has met 9 of 9 long term goals due to improved activity tolerance, improved balance, postural control and ability to compensate for deficits.  Patient to discharge at overall S - CGA level.  Patient's care partner is independent to provide the necessary physical and cognitive assistance at discharge.  Her son, son's partner, and daughter attended family education on 08/18/19.  Reasons goals not met: all goals met  Recommendation:  Patient will benefit from ongoing skilled OT services in home health setting to continue to advance functional skills in the area of BADL and Reduce care partner burden.  Equipment: TTB and 3 in 1 commode chair  Reasons for discharge: treatment goals met  Patient/family agrees with progress made and goals achieved: Yes  OT Discharge Precautions/Restrictions  Precautions Precautions: Fall General OT Amount of Missed Time: 50 Minutes Vital Signs Therapy Vitals Temp: 97.9 F (36.6 C) Pulse Rate: 64 BP: 122/66 Patient Position (if appropriate): Lying Oxygen Therapy SpO2: 97 % O2 Device: Room Air Pain Pain Assessment Pain Scale: 0-10 Pain Score: 0-No pain ADL ADL Eating: Not assessed Grooming: Moderate assistance Where Assessed-Grooming: Standing at sink Upper Body Bathing: Supervision/safety Where Assessed-Upper Body Bathing: Sitting at sink Lower Body Bathing: Moderate assistance Where Assessed-Lower Body Bathing: Standing at sink, Sitting at sink Upper Body Dressing: Moderate assistance Where Assessed-Upper Body Dressing: Sitting at sink Lower Body Dressing: Maximal assistance Where Assessed-Lower Body Dressing: Standing at sink, Sitting at sink Toileting: Not assessed Toilet Transfer: Moderate assistance Toilet Transfer Method: Stand pivot Toilet Transfer Equipment: Ambulance person Transfer: Not  assessed Vision Baseline Vision/History: No visual deficits;Wears glasses Wears Glasses: At all times Cognition Overall Cognitive Status: History of cognitive impairments - at baseline Arousal/Alertness: Awake/alert Orientation Level: Oriented to person Selective Attention: Impaired Selective Attention Impairment: Verbal basic;Functional basic Memory Impairment: Decreased recall of new information;Storage deficit;Retrieval deficit;Decreased short term memory;Decreased long term memory Decreased Long Term Memory: Functional basic;Functional complex Decreased Short Term Memory: Functional basic;Verbal basic Awareness: Impaired Awareness Impairment: Intellectual impairment;Emergent impairment Problem Solving: Impaired Problem Solving Impairment: Verbal basic;Functional basic Executive Function: (all impaired due to lower level deficits) Behaviors: Lability;Poor frustration tolerance;Verbal agitation;Impulsive Safety/Judgment: Impaired Sensation Coordination Gross Motor Movements are Fluid and Coordinated: No Fine Motor Movements are Fluid and Coordinated: No Coordination and Movement Description: functional Motor  Motor Motor: Hemiplegia;Motor apraxia;Motor impersistence;Abnormal postural alignment and control Mobility  Bed Mobility Bed Mobility: Rolling Left;Rolling Right;Supine to Sit;Sit to Supine Rolling Right: Supervision/verbal cueing Rolling Left: Supervision/Verbal cueing Supine to Sit: Supervision/Verbal cueing Sit to Supine: Supervision/Verbal cueing Transfers Sit to Stand: Supervision/Verbal cueing  Trunk/Postural Assessment  Cervical Assessment Cervical Assessment: Exceptions to WFL(forward head) Thoracic Assessment Thoracic Assessment: Exceptions to WFL(rounded shoulders) Lumbar Assessment Lumbar Assessment: Exceptions to WFL(posterior pelvic tilt)  Balance Balance Balance Assessed: Yes Static Sitting Balance Static Sitting - Balance Support: Feet  supported Static Sitting - Level of Assistance: 6: Modified independent (Device/Increase time) Dynamic Sitting Balance Dynamic Sitting - Balance Support: No upper extremity supported;During functional activity Dynamic Sitting - Level of Assistance: 5: Stand by assistance Static Standing Balance Static Standing - Balance Support: Right upper extremity supported Static Standing - Level of Assistance: 5: Stand by assistance Dynamic Standing Balance Dynamic Standing - Balance Support: Right upper extremity supported Dynamic Standing - Level of Assistance: 5: Stand by assistance Extremity/Trunk Assessment RUE Assessment RUE Assessment: Within Functional Limits LUE Assessment LUE Assessment: Within  Functional Limits   Gypsy Decant 08/19/2019, 7:35 AM

## 2019-08-19 NOTE — Care Management (Signed)
   The overall goal for the admission was met for:   Discharge location: Home with son  Length of Stay: 15 days with discharge on 08/20/19  Discharge activity level:  Patient to discharge at overall S - CGA level.  Home/community participation: Limited Participation  Services provided included: MD, RD, PT, OT, SLP, RN, CM, TR, Pharmacy, Neuropsych and SW  Financial Services: Medicare  Follow-up services arranged: Home Health: PT, OT, SLP, DME: W/C, RW, 3n1, TTB and hospital bed and Patient/Family has no preference for HH/DME agencies  Comments (or additional information): [facility] [phone]  Patient/Family verbalized understanding of follow-up arrangements: Yes   Patient's care partner is independent to provide the necessary physical and cognitive assistance at discharge.  Her son, son's partner, and daughter attended family education on 08/18/19.  Individual responsible for coordination of the follow-up plan: Son Chris: [phone]  Confirmed correct DME delivered: ,  B 08/19/2019    ,  B 

## 2019-08-19 NOTE — Progress Notes (Signed)
Team Conference Report to Patient/Family  Team Conference discussion was reviewed with the patient, patient's son and caregiver, including goals, any changes in plan of care and target discharge date.  Patient, patient's son and caregiver expressed understanding and are in agreement.  The patient has a target discharge date of 08/20/19. Reviewed DME and hospital bed for discharge. Recommended items were ordered and hospital bed to be delivered to the home for discharge and other DME to be delivered to the room. Son states an understanding of out of pocket cost for TTB. Podiatrist office referral was placed by Justice Deeds PA and that office should be calling to set up an appointment.   Yolanda Hopkins 08/19/2019, 11:49 AM

## 2019-08-19 NOTE — Progress Notes (Addendum)
Speech Language Pathology Discharge Summary  Patient Details  Name: Yolanda Hopkins MRN: [id number] Date of Birth: 1941/03/30  Today's Date: 08/19/2019 SLP Individual Time: 8688-5207 SLP Individual Time Calculation (min): 26 min   Skilled Therapeutic Interventions:  Pt was seen for skilled ST targeting cognitive goals. Pt used external aids to orient to place and time with overall Min A verbal and visual cues. She was independently oriented to self and oriented to situation with a Min A question cue. SLP modified schedule to a simplified form, which pt used to anticipate future appointments with Mod A verbal and visual cues. She appropriately expressed need to void when Min A verbal cues, and required Min A verbal cues for sequencing during transfer and other self care tasks. Mod A provided for delayed recall of how to use remote for TV on/off function. Pt left in bed with alarm set and needs within reach. Continue per current plan of care.       Patient has met 6 of 8 long term goals.  Patient to discharge at overall Modified Independent;Min;Mod level.  Reasons goals not met: minimal carryover of new information/cognitive-linguistic strategies due to severe memory deficits   Clinical Impression/Discharge Summary:   Pt made functional gains and met 7 out of 8 long term goals this admission. Pt currently requires Min-Mod assist for basic cognitive tasks and will require 24/7 supervision at discharge. Pt is consuming regular texture diet with thin liquids and required Supervision A for clearance of buccal pocketing. Pt has demonstrated improved basic problem solving during familiar tasks, selective attention, and use of aids to assist with orientation and recall. However, given significant cognitive-linguistic deficits and mild dysphagia still present, recommend pt continue to receive skilled ST services upon discharge. Pt and family education is complete at this time.    Care Partner:  Caregiver  Able to Provide Assistance: Yes  Type of Caregiver Assistance: Cognitive  Recommendation:  Home Health SLP;24 hour supervision/assistance  Rationale for SLP Follow Up: Maximize cognitive function and independence;Reduce caregiver burden;Maximize swallowing safety   Equipment: none   Reasons for discharge: Discharged from hospital   Patient/Family Agrees with Progress Made and Goals Achieved: Yes    Arbutus Leas 08/19/2019, 7:05 AM

## 2019-08-19 NOTE — Progress Notes (Signed)
Occupational Therapy Session Note  Patient Details  Name: Yolanda Hopkins MRN: [id number] Date of Birth: 10/15/1940  Today's Date: 08/19/2019 OT Individual Time: 1093-2355 OT Individual Time Calculation (min): 10 min  and Today's Date: 08/19/2019 OT Missed Time: 50 Minutes Missed Time Reason: Patient fatigue;Patient unwilling/refused to participate without medical reason   Short Term Goals: Week 2:  OT Short Term Goal 1 (Week 2): STGs=LTGs due to ELOS  Skilled Therapeutic Interventions/Progress Updates:    Upon entering the room, pt supine in bed and sleeping soundly. Multiple attempts made to wake pt for therapeutic intervention. Pt finally opens eyes and replies "yes" when asked if she needed to use bathroom. However, once therapist sets up environment and equipment she is promptly back to sleep and declines OT intervention. Bed alarm activated and call bell within reach. Pt sleeping soundly and 50 missed minutes secondary to fatigue.   Therapy Documentation Precautions:  Precautions Precautions: Fall Restrictions Weight Bearing Restrictions: No General: General OT Amount of Missed Time: 50 Minutes Vital Signs: Therapy Vitals Temp: 97.9 F (36.6 C) Pulse Rate: 64 BP: 122/66 Patient Position (if appropriate): Lying Oxygen Therapy SpO2: 97 % O2 Device: Room Air Pain:   ADL: ADL Eating: Not assessed Grooming: Moderate assistance Where Assessed-Grooming: Standing at sink Upper Body Bathing: Supervision/safety Where Assessed-Upper Body Bathing: Sitting at sink Lower Body Bathing: Moderate assistance Where Assessed-Lower Body Bathing: Standing at sink, Sitting at sink Upper Body Dressing: Moderate assistance Where Assessed-Upper Body Dressing: Sitting at sink Lower Body Dressing: Maximal assistance Where Assessed-Lower Body Dressing: Standing at sink, Sitting at sink Toileting: Not assessed Toilet Transfer: Moderate assistance Toilet Transfer Method: Stand pivot Toilet  Transfer Equipment: Grab bars Tub/Shower Transfer: Not assessed   Therapy/Group: Individual Therapy  Alen Bleacher 08/19/2019, 7:21 AM

## 2019-08-19 NOTE — Progress Notes (Signed)
Physical Therapy Discharge Summary  Patient Details  Name: Yolanda Hopkins MRN: [id number] Date of Birth: [birth year]  Today's Date: 08/19/2019 PT Individual Time: 1032-1125 AND 1400-1440 PT Individual Time Calculation (min): 53 min and 40 min    Patient has met 7 of 7 long term goals due to improved activity tolerance, improved balance, improved postural control, increased strength, increased range of motion, ability to compensate for deficits, functional use of  right lower extremity, improved attention, improved awareness and improved coordination.  Patient to discharge at an ambulatory level Supervision.   Patient's care partner is independent to provide the necessary physical and cognitive assistance at discharge.  Reasons goals not met: ALl PT goals met.   Recommendation:  Patient will benefit from ongoing skilled PT services in home health setting to continue to advance safe functional mobility, address ongoing impairments in balance, safety, transfers, posture, gait, awareness, and minimize fall risk.  Equipment: WC, RW.   Reasons for discharge: treatment goals met and discharge from hospital  Patient/family agrees with progress made and goals achieved: Yes   PT treatment: Session 1  Pt received supine in bed and agreeable to PT. Supine>sit transfer with supervision assist and increased time. Pt reports need for urination. Ambulatory transfer to toilet with RW and supervision assist. Pt noted to have been in continent, but also able to void bladder once on toilet. Clothing management and pericare performed by pt with supervision assist from PT for safety.  PT instructed pt in Grad day assessment to measure progress toward goals. See below for details.  Stair management with supervision assist and increased time x 12 steps as listed below. Gait training with RW over level surface x 153f with supervision assist cues for AD management, as well as gait training up/down 10 ft ramp with  supervision assist and cues for increased step length on the R. Pt able perform car transfer without AD and CGA for safety and cues for awareness of RLE to prevent lateral LOB . Patient returned to room and performed stand pivot to recliner with RW and supervision assist. Pt left sitting in recliner with call bell in reach and all needs met.      session 2.  Pt received sitting in WC and agreeable to PT. [place] Gait training in simulated community environment over cement side walk with RW [id number] assist overall from PT with min cues for AD management in turns. Poor carryover with cues for proper use of AD in turn, but no LOB noted when transferring to WC. [facility]stand with and without RW x 5 throughout treatment with supervision assist and only occasional cues to push from WC. [facility] requested to return to bed. [facility] and performed stand pivot transfer to bed with HHA per pt request. Sit>supine completed with supervision assist and min cues for improved positioning to prevent LOB off EOB. Left supine in bed with call bell in reach and all needs met.      PT Discharge Precautions/Restrictions   fall  Pain   denies Vision/Perception     Mild R inattention.  Cognition Overall Cognitive Status: History of cognitive impairments - at baseline Arousal/Alertness: Awake/alert Orientation Level: Oriented to person Selective Attention: Impaired Selective Attention Impairment: Verbal basic;Functional basic Memory Impairment: Decreased recall of new information;Storage deficit;Retrieval deficit;Decreased short term memory;Decreased long term memory Decreased Long Term Memory: Functional basic;Functional complex Decreased Short Term Memory: Functional basic;Verbal basic Awareness Impairment:  Intellectual impairment;Emergent impairment Problem Solving: Impaired Problem Solving Impairment: Verbal basic;Functional basic Behaviors:  Lability;Poor frustration tolerance;Verbal agitation;Impulsive Safety/Judgment: Impaired Comments: decreased frustration tolerance. Sensation Sensation Light Touch: Impaired Detail Light Touch Impaired Details: Impaired RLE Proprioception: Impaired Detail Proprioception Impaired Details: Impaired RLE Coordination Gross Motor Movements are Fluid and Coordinated: No Fine Motor Movements are Fluid and Coordinated: No Coordination and Movement Description: functional Heel Shin Test: decreased coordination in RLE when required to dual task Motor  Motor Motor: Hemiplegia;Motor apraxia;Motor impersistence;Abnormal postural alignment and control Motor - Discharge Observations: improved from eval. remains limited in stimulating environments  Mobility Bed Mobility Bed Mobility: Rolling Left;Rolling Right;Supine to Sit;Sit to Supine Rolling Right: Supervision/verbal cueing Rolling Left: Supervision/Verbal cueing Supine to Sit: Supervision/Verbal cueing Sit to Supine: Supervision/Verbal cueing Transfers Transfers: Sit to Stand;Stand Pivot Transfers Sit to Stand: Supervision/Verbal cueing Stand Pivot Transfers: Supervision/Verbal cueing Stand Pivot Transfer Details: Visual cues/gestures for sequencing;Tactile cues for placement;Verbal cues for sequencing;Verbal cues for precautions/safety;Verbal cues for technique Transfer (Assistive device): Rolling walker Locomotion  Gait Ambulation: Yes Gait Assistance: Supervision/Verbal cueing Gait Distance (Feet): 150 Feet Assistive device: Rolling walker Gait Assistance Details: Verbal cues for technique;Verbal cues for gait pattern;Verbal cues for safe use of DME/AE Gait Gait: Yes Gait Pattern: Impaired Gait Pattern: Decreased step length - right;Right steppage;Narrow base of support;Poor foot clearance - right Stairs / Additional Locomotion Stairs: Yes Stairs Assistance: Supervision/Verbal cueing Stair Management Technique: Two rails Number  of Stairs: 12 Height of Stairs: 6 Wheelchair Mobility Wheelchair Mobility: Yes Wheelchair Assistance: Development worker, international aid: Both upper extremities Wheelchair Parts Management: Needs assistance Distance: 150  Trunk/Postural Assessment  Cervical Assessment Cervical Assessment: Exceptions to WFL(forward head) Thoracic Assessment Thoracic Assessment: Exceptions to WFL(rounded shoulders) Lumbar Assessment Lumbar Assessment: Exceptions to WFL(posterior pelvic tilt) Postural Control Postural Control: Deficits on evaluation(mild R lateral lean initially, then able to correct) Protective Responses: delayed  Balance Balance Balance Assessed: Yes Static Sitting Balance Static Sitting - Balance Support: Feet supported Static Sitting - Level of Assistance: 6: Modified independent (Device/Increase time) Dynamic Sitting Balance Dynamic Sitting - Balance Support: No upper extremity supported;During functional activity Dynamic Sitting - Level of Assistance: 5: Stand by assistance Static Standing Balance Static Standing - Balance Support: No upper extremity supported Static Standing - Level of Assistance: 5: Stand by assistance Dynamic Standing Balance Dynamic Standing - Balance Support: Right upper extremity supported;During functional activity;Left upper extremity supported Dynamic Standing - Level of Assistance: 5: Stand by assistance Extremity Assessment      RLE Assessment RLE Assessment: Exceptions to [facility] General Strength Comments: grossly 4+/5 proximal to distal, but mild apraxia due to poor attention to the RLE LLE Assessment LLE Assessment: Within Functional Limits    Lorie Phenix 08/19/2019, 11:33 AM

## 2019-08-19 NOTE — Progress Notes (Signed)
[facility] PHYSICAL MEDICINE & REHABILITATION PROGRESS NOTE   Subjective/Complaints: No complaints this morning. Feels frustrated at having to answer questions.  Denies pain, dizziness, fatigue, headache, palpitations, chest pain, shortness of breath, constipation, insomnia.   ROS: Limited due to cognitive/behavioral   Objective:   No results found. No results for input(s): WBC, HGB, HCT, PLT in the last 72 hours. Recent Labs    08/19/19 0522  CREATININE 0.78    Intake/Output Summary (Last 24 hours) at 08/19/2019 1008 Last data filed at 08/19/2019 0730 Gross per 24 hour  Intake 297 ml  Output --  Net 297 ml     Physical Exam: Vital Signs Blood pressure 122/66, pulse 64, temperature 97.9 F (36.6 C), resp. rate 18, height 5' 4.5" (1.638 m), weight 53.8 kg, SpO2 97 %.  General: No acute distress Mood and affect are appropriate Heart: Regular rate and rhythm no rubs murmurs or extra sounds Lungs: Clear to auscultation, breathing unlabored, no rales or wheezes Abdomen: Positive bowel sounds, soft nontender to palpation, nondistended Extremities: No clubbing, cyanosis, or edema Skin: No evidence of breakdown, no evidence of rash Neurologic motor strength is 5/5 in bilateral deltoid, bicep, tricep, grip, 4/5 right and 5/5 left hip flexor, knee extensors, ankle dorsiflexor and plantar flexor No signs of agitation Functional mobility: Having difficulty rising from sit to stand in therapy session.   Assessment/Plan: 1. 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  Physiatrist is providing close team supervision and 24 hour management of active medical problems listed below.  Physiatrist and rehab team continue to assess barriers to discharge/monitor patient progress toward functional and medical goals  Care Tool:  Bathing    Body parts bathed by patient: Right arm,  Left arm, Chest, Abdomen, Front perineal area, Buttocks, Right upper leg, Left upper leg, Face, Right lower leg, Left lower leg   Body parts bathed by helper: Right lower leg, Left lower leg     Bathing assist Assist Level: Contact Guard/Touching assist(per staff report)     Upper Body Dressing/Undressing Upper body dressing   What is the patient wearing?: Pull over shirt    Upper body assist Assist Level: Supervision/Verbal cueing(per staff report)    Lower Body Dressing/Undressing Lower body dressing      What is the patient wearing?: Pants, Underwear/pull up     Lower body assist Assist for lower body dressing: Contact Guard/Touching assist(per staff report)     Toileting Toileting Toileting Activity did not occur Landscape architect and hygiene only): Refused  Toileting assist Assist for toileting: Contact Guard/Touching assist     Transfers Chair/bed transfer  Transfers assist     Chair/bed transfer assist level: Contact Guard/Touching assist     Locomotion Ambulation   Ambulation assist      Assist level: Contact Guard/Touching assist Assistive device: No Device Max distance: 10'   Walk 10 feet activity   Assist  Walk 10 feet activity did not occur: Safety/medical concerns  Assist level: Contact Guard/Touching assist Assistive device: Hand held assist   Walk 50 feet activity   Assist Walk 50 feet with 2 turns activity did not occur: Safety/medical concerns  Assist level: Minimal Assistance - Patient > 75% Assistive device: Walker-rolling    Walk 150 feet activity   Assist Walk 150 feet activity did not occur: Safety/medical concerns         Walk 10 feet on uneven surface  activity  Assist Walk 10 feet on uneven surfaces activity did not occur: Safety/medical concerns         Wheelchair     Assist Will patient use wheelchair at discharge?: Yes Type of Wheelchair: Manual    Wheelchair assist level: Minimal  Assistance - Patient > 75% Max wheelchair distance: 75    Wheelchair 50 feet with 2 turns activity    Assist        Assist Level: Minimal Assistance - Patient > 75%   Wheelchair 150 feet activity     Assist      Assist Level: Moderate Assistance - Patient 50 - 74%   Blood pressure 122/66, pulse 64, temperature 97.9 F (36.6 C), resp. rate 18, height 5' 4.5" (1.638 m), weight 53.8 kg, SpO2 97 %.   Medical Problem List and Plan: 1.  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  Antithrombotics: -DVT/anticoagulation: Lovenox CBC within normal limits on 3/26             -antiplatelet therapy: Aspirin 325 mg daily and Plavix 75 mg daily x3 months then aspirin alone due to severe left ACA stenosis 3. Pain Management: Tylenol as needed 4. Mood/history of memory loss.  Remeron 7.5 mg nightly, increase to 15 mg             -antipsychotic agents: N/A 5. 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 Skin/Wound Care: Routine skin checks 7. Fluids/Electrolytes/Nutrition: encourage PO, +880 yesterday, eating 100% 8.  Hyperlipidemia: Continue Lipitor 9.  Tobacco abuse.  NicoDerm patch.  Provide counseling 10. Incontinent of bowel and bladder?-   Several bowel movements after disimpaction 11. Hx of urinary frequency- not sure of underlying cause- heard from daughter, however could have been just her smoke break based on hx.  PVRs with low residuals.  Continent of bladder   -time voids 12.  History of hypertension: Not on any meds at present  Controlled 4/8 Vitals:   08/18/19 2012 08/19/19 0433  BP: (!) 126/59 122/66  Pulse: 69 64  Resp: 18   Temp: 97.8 F (36.6 C) 97.9 F (36.6 C)  SpO2: 97% 97%  13. Constipation improved on Miralax BID, last bowel  movement 08/16/2019. Continue timed toileting.  14. Disposition: Family has received training and will provide assistance upon discharge. Home equipment: TTB and 3 in 1 commode chair. DC 4/9.   LOS: 14 days A FACE TO FACE EVALUATION WAS PERFORMED  Yolanda Hopkins P Maurica Omura 08/19/2019, 10:08 AM

## 2019-08-19 NOTE — Discharge Instructions (Signed)
Inpatient Rehab Discharge Instructions  Yolanda Hopkins Discharge date and time: No discharge date for patient encounter.   Activities/Precautions/ Functional Status: Activity: activity as tolerated Diet: regular diet Wound Care: none needed Functional status:  ___ No restrictions     ___ Walk up steps independently ___ 24/7 supervision/assistance   ___ Walk up steps with assistance ___ Intermittent supervision/assistance  ___ Bathe/dress independently ___ Walk with walker     _x__ Bathe/dress with assistance ___ Walk Independently    ___ Shower independently ___ Walk with assistance    ___ Shower with assistance ___ No alcohol     ___ Return to work/school ________  COMMUNITY REFERRALS UPON DISCHARGE:  Home Health: PT, OT, ST  Agency:   [facility] Phone:[phone]  Medical Equipment/Items Ordered:W/C, RW, 3n1, TTB and hospital bed  Agency/Supplier:[facility]  Patient to follow up with stroke clinic NP at [facility] in about 4 weeks.   Marvel Plan, MD PhD Stroke Neurology     Special Instructions: No driving smoking or alcohol  Aspirin 325 mg daily and Plavix 75 mg daily x3 months then aspirin STROKE/TIA DISCHARGE INSTRUCTIONS SMOKING Cigarette smoking nearly doubles your risk of having a stroke & is the single most alterable risk factor  If you smoke or have smoked in the last 12 months, you are advised to quit smoking for your health.  Most of the excess cardiovascular risk related to smoking disappears within a year of stopping.  Ask you doctor about anti-smoking medications  [place] Quit Line: 1-800-QUIT NOW  Free Smoking Cessation Classes [phone]  CHOLESTEROL Know your levels; limit fat & cholesterol in your diet  Lipid Panel     Component Value Date/Time   CHOL 233 (H) 07/31/2019 0015   TRIG 53 07/31/2019 0015   HDL 64 07/31/2019 0015   CHOLHDL 3.6 07/31/2019 0015   VLDL 11 07/31/2019 0015   LDLCALC 158 (H) 07/31/2019 0015       Many patients benefit from treatment even if their cholesterol is at goal.  Goal: Total Cholesterol (CHOL) less than 160  Goal:  Triglycerides (TRIG) less than 150  Goal:  HDL greater than 40  Goal:  LDL (LDLCALC) less than 100   BLOOD PRESSURE American Stroke Association blood pressure target is less that 120/80 mm/Hg  Your discharge blood pressure is:  BP: 114/69  Monitor your blood pressure  Limit your salt and alcohol intake  Many individuals will require more than one medication for high blood pressure  DIABETES (A1c is a blood sugar average for last 3 months) Goal HGBA1c is under 7% (HBGA1c is blood sugar average for last 3 months)  Diabetes: No known diagnosis of diabetes    Lab Results  Component Value Date   HGBA1C 5.3 07/31/2019     Your HGBA1c can be lowered with medications, healthy diet, and exercise.  Check your blood sugar as directed by your physician  Call your physician if you experience unexplained or low blood sugars.  PHYSICAL ACTIVITY/REHABILITATION Goal is 30 minutes at least 4 days per week  Activity: Increase activity slowly, Therapies: Physical Therapy: Home Health Return to work:   Activity decreases your risk of heart attack and stroke and makes your heart stronger.  It helps control your weight and blood pressure; helps you relax and can improve your mood.  Participate in a regular exercise program.  Talk with your doctor about the best form of exercise for you (dancing, walking, swimming, cycling).  DIET/WEIGHT Goal is  to maintain a healthy weight  Your discharge diet is:  Diet Order            Diet Heart Room service appropriate? No; Fluid consistency: Thin  Diet effective now              liquids Your height is:  Height: 5' 4.5" (163.8 cm) Your current weight is: Weight: 53.8 kg Your Body Mass Index (BMI) is:  BMI (Calculated): 20.05  Following the type of diet specifically designed for you will help prevent another  stroke.  Your goal weight range is:    Your goal Body Mass Index (BMI) is 19-24.  Healthy food habits can help reduce 3 risk factors for stroke:  High cholesterol, hypertension, and excess weight.  RESOURCES Stroke/Support Group:  Call [phone]   STROKE EDUCATION PROVIDED/REVIEWED AND GIVEN TO PATIENT Stroke warning signs and symptoms How to activate emergency medical system (call 911). Medications prescribed at discharge. Need for follow-up after discharge. Personal risk factors for stroke. Pneumonia vaccine given:  Flu vaccine given:  My questions have been answered, the writing is legible, and I understand these instructions.  I will adhere to these goals & educational materials that have been provided to me after my discharge from the hospital.    alone   My questions have been answered and I understand these instructions. I will adhere to these goals and the provided educational materials after my discharge from the hospital.  Patient/Caregiver Signature _______________________________ Date __________  Clinician Signature _______________________________________ Date __________  Please bring this form and your medication list with you to all your follow-up doctor's appointments.

## 2019-08-20 ENCOUNTER — Ambulatory Visit: Payer: PRIVATE HEALTH INSURANCE

## 2019-08-20 NOTE — Progress Notes (Signed)
Patient was taken out to personal family vehicle via wheelchair by staff assist. Patient alert per her usual. Family in and packed all personal items out to vehicle. Patient continued to pick at her chest causing a sore, but would not leave the bandage in place. Area was cleansed and not bleeding on discharge.

## 2019-08-20 NOTE — Progress Notes (Signed)
[place] PHYSICAL MEDICINE & REHABILITATION PROGRESS NOTE   Subjective/Complaints: No new complaints this morning.   ROS: Limited due to cognitive/behavioral   Objective:   No results found. No results for input(s): WBC, HGB, HCT, PLT in the last 72 hours. Recent Labs    08/19/19 0522  CREATININE 0.78    Intake/Output Summary (Last 24 hours) at 08/20/2019 1029 Last data filed at 08/20/2019 0900 Gross per 24 hour  Intake 360 ml  Output --  Net 360 ml     Physical Exam: Vital Signs Blood pressure 128/63, pulse 67, temperature 98.3 F (36.8 C), resp. rate 16, height 5' 4.5" (1.638 m), weight 53.8 kg, SpO2 94 %.  Constitutional: No distress . Vital signs reviewed. HEENT: EOMI, oral membranes moist Neck: supple Cardiovascular: RRR without murmur. No JVD    Respiratory/Chest: CTA Bilaterally without wheezes or rales. Normal effort    GI/Abdomen: BS +, non-tender, non-distended Ext: no clubbing, cyanosis, or edema Psych: pleasant and cooperative Skin: No evidence of breakdown, no evidence of rash Neurologic motor strength is 5/5 in bilateral deltoid, bicep, tricep, grip, 4/5 right and 5/5 left hip flexor, knee extensors, ankle dorsiflexor and plantar flexor No signs of agitation     Assessment/Plan: 1. 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  Physiatrist is providing close team supervision and 24 hour management of active medical problems listed below.  Physiatrist and rehab team continue to assess barriers to discharge/monitor patient progress toward functional and medical goals  Care Tool:  Bathing    Body parts bathed by patient: Right arm, Left arm, Chest, Abdomen, Front perineal area, Buttocks, Right upper leg, Left upper leg, Face, Right lower leg, Left lower leg   Body parts bathed by helper: Right lower leg, Left lower leg     Bathing assist  Assist Level: Contact Guard/Touching assist(per staff report)     Upper Body Dressing/Undressing Upper body dressing   What is the patient wearing?: Pull over shirt    Upper body assist Assist Level: Supervision/Verbal cueing(per staff report)    Lower Body Dressing/Undressing Lower body dressing      What is the patient wearing?: Pants, Underwear/pull up     Lower body assist Assist for lower body dressing: Contact Guard/Touching assist(per staff report)     Toileting Toileting Toileting Activity did not occur Landscape architect and hygiene only): Refused  Toileting assist Assist for toileting: Contact Guard/Touching assist     Transfers Chair/bed transfer  Transfers assist     Chair/bed transfer assist level: Supervision/Verbal cueing     Locomotion Ambulation   Ambulation assist      Assist level: Supervision/Verbal cueing Assistive device: Walker-rolling Max distance: 150   Walk 10 feet activity   Assist  Walk 10 feet activity did not occur: Safety/medical concerns  Assist level: Supervision/Verbal cueing Assistive device: Walker-rolling   Walk 50 feet activity   Assist Walk 50 feet with 2 turns activity did not occur: Safety/medical concerns  Assist level: Supervision/Verbal cueing Assistive device: Walker-rolling    Walk 150 feet activity   Assist Walk 150 feet activity did not occur: Safety/medical concerns  Assist level: Supervision/Verbal cueing Assistive device: Walker-rolling    Walk 10 feet on uneven surface  activity   Assist Walk 10 feet on uneven surfaces activity did not occur: Safety/medical concerns   Assist level: Supervision/Verbal cueing Assistive device: Aeronautical engineer  Will patient use wheelchair at discharge?: Yes Type of Wheelchair: Manual    Wheelchair assist level: Contact Guard/Touching assist Max wheelchair distance: 150    Wheelchair 50 feet with 2 turns  activity    Assist        Assist Level: Contact Guard/Touching assist   Wheelchair 150 feet activity     Assist      Assist Level: Contact Guard/Touching assist   Blood pressure 128/63, pulse 67, temperature 98.3 F (36.8 C), resp. rate 16, height 5' 4.5" (1.638 m), weight 53.8 kg, SpO2 94 %.   Medical Problem List and Plan: 1.  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 Wynn Banker in the office for transitional care encounter in 1-2 weeks.   2.  Antithrombotics: -DVT/anticoagulation: Lovenox CBC within normal limits on 3/26             -antiplatelet therapy: Aspirin 325 mg daily and Plavix 75 mg daily x3 months then aspirin alone due to severe left ACA stenosis 3. Pain Management: Tylenol as needed 4. Mood/history of memory loss.  Remeron 7.5 mg nightly, increase to 15 mg             -antipsychotic agents: N/A 5. 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 Skin/Wound Care: Routine skin checks 7. Fluids/Electrolytes/Nutrition: encourage PO, +880 yesterday, eating 100% 8.  Hyperlipidemia: Continue Lipitor 9.  Tobacco abuse.  NicoDerm patch.  Provide counseling 10. Incontinent of bowel and bladder?-   Several bowel movements after disimpaction 11. Hx of urinary frequency- not sure of underlying cause- heard from daughter, however could have been just her smoke break based on hx.  PVRs with low residuals.  Continent of bladder   -time voids 12.  History of hypertension: Not on any meds at present  Controlled 4/9 Vitals:   08/19/19 1910 08/20/19 0513  BP: 135/61 128/63  Pulse: 75 67  Resp: 16 16  Temp: 98.2 F (36.8 C) 98.3 F (36.8 C)  SpO2: 95% 94%  13. Constipation improved on Miralax BID, last bowel  movement 08/16/2019. Continue timed toileting.     LOS: 15 days A FACE TO FACE EVALUATION WAS PERFORMED  Ranelle Oyster 08/20/2019, 10:29 AM

## 2019-08-24 ENCOUNTER — Telehealth: Payer: Self-pay | Admitting: *Deleted

## 2019-08-24 NOTE — Telephone Encounter (Signed)
Transitional Care call--who you talked with I spoke with son but he was at work and said to call Yolanda Hopkins home number.  There was no answer so I left a message that I will call back this afternoon. He called back and call completed.    1. Are you/is patient experiencing any problems since coming home? Are there any questions regarding any aspect of care? NO 2. Are there any questions regarding medications administration/dosing? Are meds being taken as prescribed? Patient should review meds with caller to confirm NO, all medications accounted for from discharge instructions. 3. Have there been any falls? NO 4. Has Home Health been to the house and/or have they contacted you? If not, have you tried to contact them? Can we help you contact them? YES all have been out and therapy in progress. 5. Are bowels and bladder emptying properly? Are there any unexpected incontinence issues? If applicable, is patient following bowel/bladder programs? NO problems.  6. Any fevers, problems with breathing, unexpected pain? NO 7. Are there any skin problems or new areas of breakdown? NO 8. Has the patient/family member arranged specialty MD follow up (ie cardiology/neurology/renal/surgical/etc)?  Can we help arrange? 9. Does the patient need any other services or support that we can help arrange? NO 10. Are caregivers following through as expected in assisting the patient? YES 11. Has the patient quit smoking, drinking alcohol, or using drugs as recommended? She is wearing patch but has had a few puffs of a cigarette...knows to stop.  Appointment Thursday 09/02/19 with Jacalyn Lefevre NP @ 1:00 arrive by 12:40 Address reviewed and know to watch mail for packet from our office. [id number]

## 2019-08-25 ENCOUNTER — Ambulatory Visit: Payer: Medicare Other | Attending: Internal Medicine

## 2019-08-25 DIAGNOSIS — Z23 Encounter for immunization: Secondary | ICD-10-CM

## 2019-08-25 NOTE — Progress Notes (Signed)
   Covid-19 Vaccination Clinic  Name:  Yolanda Hopkins    MRN: [id number] DOB: March 01, 1941  08/25/2019  [name] was observed post Covid-19 immunization for 15 minutes without incident. She was provided with Vaccine Information Sheet and instruction to access the V-Safe system.   [name] was instructed to call 911 with any severe reactions post vaccine: Marland Kitchen Difficulty breathing  . Swelling of face and throat  . A fast heartbeat  . A bad rash all over body  . Dizziness and weakness   Immunizations Administered    Name Date Dose VIS Date Route   Moderna COVID-19 Vaccine 08/25/2019 10:00 AM 0.5 mL 04/13/2019 Intramuscular   Manufacturer: Moderna   Lot: [id number]   NDC: 02202-669-16

## 2019-08-30 ENCOUNTER — Encounter: Payer: Self-pay | Admitting: Podiatry

## 2019-08-30 ENCOUNTER — Ambulatory Visit (INDEPENDENT_AMBULATORY_CARE_PROVIDER_SITE_OTHER): Payer: Medicare Other | Admitting: Podiatry

## 2019-08-30 ENCOUNTER — Other Ambulatory Visit: Payer: Self-pay

## 2019-08-30 VITALS — BP 135/67 | HR 78 | Temp 96.9°F

## 2019-08-30 DIAGNOSIS — M79675 Pain in left toe(s): Secondary | ICD-10-CM | POA: Diagnosis not present

## 2019-08-30 DIAGNOSIS — L84 Corns and callosities: Secondary | ICD-10-CM | POA: Diagnosis not present

## 2019-08-30 DIAGNOSIS — M2041 Other hammer toe(s) (acquired), right foot: Secondary | ICD-10-CM

## 2019-08-30 DIAGNOSIS — M79674 Pain in right toe(s): Secondary | ICD-10-CM

## 2019-08-30 DIAGNOSIS — M2042 Other hammer toe(s) (acquired), left foot: Secondary | ICD-10-CM

## 2019-08-30 DIAGNOSIS — D689 Coagulation defect, unspecified: Secondary | ICD-10-CM

## 2019-08-30 DIAGNOSIS — B351 Tinea unguium: Secondary | ICD-10-CM

## 2019-08-30 NOTE — Patient Instructions (Signed)

## 2019-09-02 ENCOUNTER — Encounter: Payer: Self-pay | Admitting: Registered Nurse

## 2019-09-02 ENCOUNTER — Other Ambulatory Visit: Payer: Self-pay

## 2019-09-02 ENCOUNTER — Encounter: Payer: Medicare Other | Attending: Registered Nurse | Admitting: Registered Nurse

## 2019-09-02 VITALS — BP 145/75 | HR 84 | Temp 96.7°F | Ht 63.0 in | Wt 122.0 lb

## 2019-09-02 DIAGNOSIS — Z8679 Personal history of other diseases of the circulatory system: Secondary | ICD-10-CM | POA: Diagnosis present

## 2019-09-02 DIAGNOSIS — I63522 Cerebral infarction due to unspecified occlusion or stenosis of left anterior cerebral artery: Secondary | ICD-10-CM | POA: Insufficient documentation

## 2019-09-02 DIAGNOSIS — E785 Hyperlipidemia, unspecified: Secondary | ICD-10-CM

## 2019-09-02 NOTE — Progress Notes (Signed)
Subjective: Yolanda Hopkins presents today referred by [facility] for complaint of painful mycotic nails b/l that are difficult to trim. Pain interferes with ambulation. Aggravating factors include wearing enclosed shoe gear. Pain is relieved with periodic professional debridement.  Her son is present during today's visit.   She has h/o fall, was hospitalized and sent to rehab. She is now at home. She continues to smoke 1 cigarette per day,  She has h/o CVA and is on blood thinner, Plavix.  Past Medical History:  Diagnosis Date  . Hypertension   . Tobacco abuse 08/19/2013  . Vertigo      Patient Active Problem List   Diagnosis Date Noted  . Incomplete defecation   . Slow transit constipation   . History of hypertension   . Urinary frequency   . Urinary incontinence   . Acute CVA (cerebrovascular accident) (HCC) 07/30/2019  . Acute cystitis without hematuria 04/18/2019  . Closed displaced fracture of right femoral neck (HCC) 04/18/2019  . Dementia (HCC) 04/18/2019  . Fall 04/18/2019  . GI bleed 05/22/2016  . Pressure injury of skin 05/22/2016  . Loss of weight   . Dyslipidemia   . Hyperlipidemia 08/30/2013  . 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 HTN (hypertension) 08/19/2013  . Tobacco abuse 08/19/2013  . Vertigo 08/19/2013  . Gait abnormality 08/19/2013  . CVA (cerebral infarction) 08/19/2013     Past Surgical History:  Procedure Laterality Date  . COLONOSCOPY N/A 05/23/2016   Procedure: COLONOSCOPY;  Surgeon: Jeani Hawking, MD;  Location: [facility] ENDOSCOPY;  Service: Endoscopy;  Laterality: N/A;  . ESOPHAGOGASTRODUODENOSCOPY N/A 05/23/2016   Procedure: ESOPHAGOGASTRODUODENOSCOPY (EGD);  Surgeon: Jeani Hawking, MD;  Location: [facility] ENDOSCOPY;  Service: Endoscopy;  Laterality: N/A;  . GIVENS CAPSULE STUDY N/A 05/23/2016   Procedure: GIVENS CAPSULE STUDY;  Surgeon: Jeani Hawking, MD;  Location: [facility] ENDOSCOPY;  Service: Endoscopy;  Laterality: N/A;      Current Outpatient Medications on File Prior to Visit  Medication Sig Dispense Refill  . acetaminophen (TYLENOL) 325 MG tablet Take 2 tablets (650 mg total) by mouth every 4 (four) hours as needed for mild pain (or temp > 37.5 C (99.5 F)).    Marland Kitchen aspirin EC 325 MG EC tablet Take 1 tablet (325 mg total) by mouth daily. 30 tablet 0  . atorvastatin (LIPITOR) 40 MG tablet Take 1 tablet (40 mg total) by mouth daily at 6 PM. 30 tablet 0  . clopidogrel (PLAVIX) 75 MG tablet Take 1 tablet (75 mg total) by mouth daily. 30 tablet 1  . mirtazapine (REMERON) 15 MG tablet Take 1 tablet (15 mg total) by mouth at bedtime. 30 tablet 0  . nicotine (NICODERM CQ - DOSED IN MG/24 HR) 7 mg/24hr patch 7 mg patch daily for two weeks and stop 28 patch 0   No current facility-administered medications on file prior to visit.     Allergies  Allergen Reactions  . Mango Flavor Nausea Only and Other (See Comments)    Flushing and extreme lethargy (effect is long-lasting and familial)  . Poison Ivy Extract Rash  . Sulfa Antibiotics Rash     Social History   Occupational History  . Not on file  Tobacco Use  . Smoking status: Current Some Day Smoker    Types: Cigarettes  . Smokeless tobacco: Never Used  Substance and Sexual Activity  . Alcohol use: No  . Drug use: No  . Sexual activity: Not on file  Family History  Problem Relation Age of Onset  . Hypertension Mother   . Hypertension Father   . Liver cancer Father      Immunization History  Administered Date(s) Administered  . Moderna SARS-COVID-2 Vaccination 08/25/2019     Objective: Vitals:   08/30/19 1040  BP: 135/67  Pulse: 78  Temp: (!) 96.9 F (36.1 C)    Pt is a pleasant 79 y.o. Caucasian female, WD, WN in NAD.  AAO x 3.   Vascular Examination:  Capillary refill time to digits immediate b/l. Palpable PT pulses b/l. Faintly palpable DP pulses b/l. Pedal hair absent b/l Skin temperature gradient warm to cool b/l. No pain with calf  compression b/l.  Dermatological Examination: Pedal skin is thin shiny, atrophic bilaterally. No open wounds bilaterally. No interdigital macerations bilaterally. Toenails 1-5 b/l elongated, dystrophic, thickened, crumbly with subungual debris and tenderness to dorsal palpation. Hyperkeratotic lesion(s) L 5th toe and R 5th toe.  No erythema, no edema, no drainage, no flocculence.  Musculoskeletal: Normal muscle strength 5/5 to all lower extremity muscle groups bilaterally, no pain crepitus or joint limitation noted with ROM b/l, hammertoes noted to the  L 5th toe and R 5th toe and utilizes walker for ambulation assistance.  Neurological: Protective sensation intact 5/5 intact bilaterally with 10g monofilament b/l. Babinski reflex negative b/l. Clonus negative b/l.  Assessment: 1. Pain due to onychomycosis of toenails of both feet   2. Corns   3. Acquired hammertoes of both feet   4. Clotting disorder (HCC)     Plan: -Toenails 1-5 b/l were debrided in length and girth with sterile nail nippers and dremel without iatrogenic bleeding.  -Corn(s) debrided L 5th toe and R 5th toe without complication or incident. Total number debrided=2. -Patient to continue soft, supportive shoe gear daily. -Patient to report any pedal injuries to medical professional immediately. -Patient/POA to call should there be question/concern in the interim.  Return in about 3 months (around 11/29/2019) for nail trim/ Plavix.

## 2019-09-02 NOTE — Progress Notes (Signed)
Subjective:    Patient ID: Yolanda Hopkins, female    DOB: 11/09/1940, 79 y.o.   MRN: [id number]  HPI: Yolanda Hopkins is a 79 y.o. female whose here for transitional care visit in follow up of her acute ischemic left ACA stroke, history hypertension and dyslipidemia.  She presented to [facility] on 07/30/2019, son noticed the patient had been having difficulty with walking and dragging her right lower extremity, over the last two days. Also reported she had two falls. Neurology was consulted.  CT Hesad WO Contrast:  IMPRESSION: 1. No CT evidence for acute intracranial abnormality. 2. Atrophy and chronic small vessel ischemic change of the whitematter.  MRI Brain:  IMPRESSION: 1. Multiple small acute infarcts within the left frontal white matter, in the left anterior cerebral artery territory. 2. Punctate focus of hyperintensity on diffusion-weighted imaging in the right cerebellar hemisphere may be artifactual. 3. Generalized atrophy, without lobar predilection. 4. 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 5. Multiple chronic microhemorrhages concentrated within the left parietal lobe. She will be maintained on aspirin and plavix for CVA prophylaxix x3 months and aspirin alone due to severe left ACA stenosis.   CT Angio Head W or WO Contrast:  IMPRESSION: 1. No intracranial arterial occlusion or high-grade stenosis. 2. 50% stenosis of the proximal left ICA secondary to mixed density Plaque.  [name] was admitted to inpatient rehabilitation on 08/05/2019 and discharged home on 08/20/2019. She is receiving outpatient therapy with [facility],   Son reports she had a un-wittness fall today, she was found in her kitchen sitting on her buttocks with her legs underneath her. The son called his brother he wasn't at the home at the time of the fall. The brother stated he doesn't believe she hit her head, no injury noted. Instructed to go to  ED if she develops any symptoms, he verbalize understanding. She arrived in wheelchair today, He also stated she walks with the physical therapist, he reports.    Pain Inventory Average Pain 7 Pain Right Now 7 My pain is intermittent  In the last 24 hours, has pain interfered with the following? General activity 7 Relation with others 9 Enjoyment of life 9 What TIME of day is your pain at its worst? varies Sleep (in general) Good  Pain is worse with: sitting Pain improves with: nothing Relief from Meds: 0  Mobility walk with assistance use a walker ability to climb steps?  no do you drive?  no use a wheelchair  Function not employed: date last employed . disabled: date disabled . I need assistance with the following:  bathing, meal prep, household duties and shopping  Neuro/Psych trouble walking  Prior Studies transitional  Physicians involved in your care transitional   Family History  Problem Relation Age of Onset  . Hypertension Mother   . Hypertension Father   . Liver cancer Father    Social History   Socioeconomic History  . Marital status: Married    Spouse name: Not on file  . Number of children: Not on file  . Years of education: Not on file  . Highest education level: Not on file  Occupational History  . Not on file  Tobacco Use  . Smoking status: Current Some Day Smoker    Types: Cigarettes  . Smokeless tobacco: Never Used  Substance and Sexual Activity  . Alcohol use: No  . Drug use: No  . Sexual activity: Not on file  Other Topics Concern  . Not on file  Social History Narrative  . Not on file   Social Determinants of Health   Financial Resource Strain:   . Difficulty of Paying Living Expenses:   Food Insecurity:   . Worried About Programme researcher, broadcasting/film/video in the Last Year:   . Barista in the Last Year:   Transportation Needs:   . Freight forwarder (Medical):   Marland Kitchen Lack of Transportation (Non-Medical):   Physical  Activity:   . Days of Exercise per Week:   . Minutes of Exercise per Session:   Stress:   . Feeling of Stress :   Social Connections:   . Frequency of Communication with Friends and Family:   . Frequency of Social Gatherings with Friends and Family:   . Attends Religious Services:   . Active Member of Clubs or Organizations:   . Attends Banker Meetings:   Marland Kitchen Marital Status:    Past Surgical History:  Procedure Laterality Date  . COLONOSCOPY N/A 05/23/2016   Procedure: COLONOSCOPY;  Surgeon: Jeani Hawking, MD;  Location: [facility] ENDOSCOPY;  Service: Endoscopy;  Laterality: N/A;  . ESOPHAGOGASTRODUODENOSCOPY N/A 05/23/2016   Procedure: ESOPHAGOGASTRODUODENOSCOPY (EGD);  Surgeon: Jeani Hawking, MD;  Location: [facility] ENDOSCOPY;  Service: Endoscopy;  Laterality: N/A;  . GIVENS CAPSULE STUDY N/A 05/23/2016   Procedure: GIVENS CAPSULE STUDY;  Surgeon: Jeani Hawking, MD;  Location: [facility] ENDOSCOPY;  Service: Endoscopy;  Laterality: N/A;   Past Medical History:  Diagnosis Date  . Hypertension   . Tobacco abuse 08/19/2013  . Vertigo    BP (!) 145/75   Pulse 84   Temp (!) 96.7 F (35.9 C)   Ht 5\' 3"  (1.6 m)   Wt 122 lb (55.3 kg)   SpO2 96%   BMI 21.61 kg/m   Opioid Risk Score:   Fall Risk Score:  `1  Depression screen PHQ 2/9  Depression screen PHQ 2/9 09/02/2019  Decreased Interest 3  Down, Depressed, Hopeless 3  PHQ - 2 Score 6  Altered sleeping 0  Tired, decreased energy 3  Change in appetite 0  Feeling bad or failure about yourself  3  Trouble concentrating 0  Moving slowly or fidgety/restless 3  Suicidal thoughts 0  PHQ-9 Score 15  Difficult doing work/chores Somewhat difficult    Review of Systems  Constitutional: Negative.   HENT: Negative.   Eyes: Negative.   Respiratory: Negative.   Cardiovascular: Negative.   Gastrointestinal: Positive for rectal pain.  Endocrine: Negative.   Genitourinary: Negative.   Musculoskeletal: Positive for gait problem.  Skin:  Negative.   Allergic/Immunologic: Negative.   Hematological: Negative.   Psychiatric/Behavioral: Negative.   All other systems reviewed and are negative.      Objective:   Physical Exam Vitals and nursing note reviewed.  Constitutional:      Appearance: Normal appearance.  Cardiovascular:     Rate and Rhythm: Normal rate and regular rhythm.     Pulses: Normal pulses.     Heart sounds: Normal heart sounds.  Pulmonary:     Effort: Pulmonary effort is normal.     Breath sounds: Normal breath sounds.  Musculoskeletal:     Cervical back: Normal range of motion and neck supple.     Comments: Normal Muscle Bulk and Muscle Testing Reveals:  Upper Extremities: Full ROM and Muscle Strength on the Right 4/5 and Left 5/5 Lower Extremities: Full ROM and Muscle Strength 4/5 Arrived in wheelchair  Skin:  General: Skin is warm and dry.  Neurological:     Mental Status: She is alert.     Comments: Alert and Oriented X2  Psychiatric:        Mood and Affect: Mood normal.        Behavior: Behavior normal.           Assessment & Plan:  1. Acute Ischemic Left ACA Stroke: Continue Home Health Therapy. Has a HFU appointment with Neurology 2. History of Hypertension: PCP Following. Continue to Monitor.  3. Hyperlipidemia: Continue current medication regimen. PCP Following.   20 minutes of face to face patient care time was spent during this visit. All questions were encouraged and answered.

## 2019-09-07 ENCOUNTER — Telehealth: Payer: Self-pay | Admitting: Physical Medicine & Rehabilitation

## 2019-09-07 NOTE — Telephone Encounter (Signed)
FMLA paperwork mailed to patient 09/07/19 to the [facility] address.

## 2019-09-22 ENCOUNTER — Encounter: Payer: Self-pay | Admitting: Adult Health

## 2019-09-22 ENCOUNTER — Other Ambulatory Visit: Payer: Self-pay

## 2019-09-22 ENCOUNTER — Ambulatory Visit (INDEPENDENT_AMBULATORY_CARE_PROVIDER_SITE_OTHER): Payer: Medicare Other | Admitting: Adult Health

## 2019-09-22 ENCOUNTER — Telehealth: Payer: Self-pay | Admitting: Adult Health

## 2019-09-22 VITALS — BP 122/84 | HR 71 | Temp 97.4°F | Ht 64.0 in | Wt 120.0 lb

## 2019-09-22 DIAGNOSIS — I63522 Cerebral infarction due to unspecified occlusion or stenosis of left anterior cerebral artery: Secondary | ICD-10-CM

## 2019-09-22 DIAGNOSIS — E782 Mixed hyperlipidemia: Secondary | ICD-10-CM

## 2019-09-22 DIAGNOSIS — I6522 Occlusion and stenosis of left carotid artery: Secondary | ICD-10-CM

## 2019-09-22 DIAGNOSIS — I10 Essential (primary) hypertension: Secondary | ICD-10-CM | POA: Diagnosis not present

## 2019-09-22 DIAGNOSIS — F0151 Vascular dementia with behavioral disturbance: Secondary | ICD-10-CM | POA: Diagnosis not present

## 2019-09-22 DIAGNOSIS — F01518 Vascular dementia, unspecified severity, with other behavioral disturbance: Secondary | ICD-10-CM

## 2019-09-22 MED ORDER — CLOPIDOGREL BISULFATE 75 MG PO TABS: 75.0000 mg | ORAL_TABLET | Freq: Every day | ORAL | 0 refills | Status: DC

## 2019-09-22 NOTE — Telephone Encounter (Signed)
Rep with [facility] called to inform that they do not offer the services pt is needing   Cb# [phone]

## 2019-09-22 NOTE — Telephone Encounter (Signed)
I have called and they receive ofv note for this pt, they are not affiliated.  After speaking to her, [facility] in [place].  (placed as pcp).  [phone], fax [phone]. Fax confirmation received.

## 2019-09-22 NOTE — Progress Notes (Signed)
[facility] [address] [place]. [place] [address] [phone]       HOSPITAL FOLLOW UP NOTE  Yolanda Hopkins Date of Birth:  11/18/1940 Medical Record Number:  [id number]   Reason for Referral:  hospital stroke follow up    SUBJECTIVE:   CHIEF COMPLAINT:  Chief Complaint  Patient presents with  . Follow-up    hos fu, with son, son states she is improving some, and more social     HPI:   Yolanda Hopkins is a 79 y.o. female with history of hypertension, dementia, previous stroke, vertigo and tobacco use,  who presented on July 30, 2019 with right sided weakness and 2 falls.   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  Initiated DAPT for 3 months then aspirin alone due to severe left ACA stenosis.  Prior stroke history 08/2013 left BG infarct.  History of HTN stable.  History of HLD with LDL 158 and changed statin from Crestor to atorvastatin 40 mg daily.  Other stroke risk factors include underlying dementia, current tobacco use and advanced age.  Residual deficits of right hemiparesis and evaluated by therapies who recommended discharge to CIR for ongoing therapy needs.  She was eventually discharged home with recommendation of home health therapies on August 20, 2019.  Strokes:  Left ACA scattered infarcts, likely due to large vessel atherosclerosis.  Resultant abulia, flat affect, right hemiparesis  CT head - No CT evidence for acute intracranial abnormality.   MRI head - Multiple small acute infarcts within the left frontal white matter, in the left anterior cerebral artery territory. 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  2D Echo - EF 55 to 60%. No cardiac source of emboli identified.   [employer] Virus 2 - not ordered  LDL - 158 -initiated atorvastatin 40 mg daily (d/c home crestor)  HgbA1c - 5.3  VTE prophylaxis - Lovenox  No antithrombotic prior to admission, now on aspirin 325 mg daily and clopidogrel 75 mg daily DAPT for 3 months and then aspirin alone due to severe left ACA stenosis.  Patient counseled to be compliant with her antithrombotic medications  Ongoing aggressive stroke risk factor management  Therapy recommendations:  CIR  Disposition:  CIR  Today, Sep 22, 2019, Yolanda Hopkins is being seen for hospital follow-up accompanied by her son.  She has been stable since discharge with residual stroke deficits of mild right sided weakness. She continues to work with [facility] PT with ongoing improvement. Ongoing use of rolling walker.  Cognition has been stable and denies worsening.  Initial concerns of dementia related behaviors but still endorses great improvement and continues on mirtazapine currently managed by PCP.  Denies new or worsening stroke/TIA symptoms. Continues DAPT with aspirin 325 mg daily and clopidogrel 75 mg daily without bleeding or bruising.  Continues on atorvastatin 40 mg daily without myalgias. Lipid panel checked recently by PCP and son reports satisfactory levels.  Blood pressure today 122/84.  No concerns at this time.     ROS:   14 system review of systems performed and negative with exception of weakness, gait impairment, memory  loss and confusion  PMH:  Past Medical History:  Diagnosis Date  . Hypertension   . Tobacco abuse 08/19/2013  . Vertigo     PSH:  Past Surgical History:  Procedure Laterality Date  . COLONOSCOPY N/A 05/23/2016   Procedure: COLONOSCOPY;  Surgeon: Jeani Hawking, MD;  Location: [facility] ENDOSCOPY;  Service: Endoscopy;  Laterality: N/A;  . ESOPHAGOGASTRODUODENOSCOPY N/A  05/23/2016   Procedure: ESOPHAGOGASTRODUODENOSCOPY (EGD);  Surgeon: Jeani Hawking, MD;  Location: [facility] ENDOSCOPY;  Service: Endoscopy;  Laterality: N/A;  . GIVENS CAPSULE STUDY N/A 05/23/2016   Procedure: GIVENS CAPSULE STUDY;  Surgeon: Jeani Hawking, MD;  Location: [facility] ENDOSCOPY;  Service: Endoscopy;  Laterality: N/A;    Social History:  Social History   Socioeconomic History  . Marital status: Married    Spouse name: Not on file  . Number of children: Not on file  . Years of education: Not on file  . Highest education level: Not on file  Occupational History  . Not on file  Tobacco Use  . Smoking status: Current Some Day Smoker    Types: Cigarettes  . Smokeless tobacco: Never Used  Substance and Sexual Activity  . Alcohol use: No  . Drug use: No  . Sexual activity: Not on file  Other Topics Concern  . Not on file  Social History Narrative  . Not on file   Social Determinants of Health   Financial Resource Strain:   . Difficulty of Paying Living Expenses:   Food Insecurity:   . Worried About Programme researcher, broadcasting/film/video in the Last Year:   . Barista in the Last Year:   Transportation Needs:   . Freight forwarder (Medical):   Marland Kitchen Lack of Transportation (Non-Medical):   Physical Activity:   . Days of Exercise per Week:   . Minutes of Exercise per Session:   Stress:   . Feeling of Stress :   Social Connections:   . Frequency of Communication with Friends and Family:   . Frequency of Social Gatherings with Friends and Family:   . Attends Religious Services:   . Active Member of Clubs or Organizations:   . Attends Banker Meetings:   Marland Kitchen Marital Status:   Intimate Partner Violence:   . Fear of Current or Ex-Partner:   . Emotionally Abused:   Marland Kitchen Physically Abused:   . Sexually Abused:     Family History:  Family History  Problem Relation Age of Onset  . Hypertension Mother   . Hypertension Father   . Liver cancer Father     Medications:   Current  Outpatient Medications on File Prior to Visit  Medication Sig Dispense Refill  . acetaminophen (TYLENOL) 325 MG tablet Take 2 tablets (650 mg total) by mouth every 4 (four) hours as needed for mild pain (or temp > 37.5 C (99.5 F)).    Marland Kitchen aspirin EC 325 MG EC tablet Take 1 tablet (325 mg total) by mouth daily. 30 tablet 0  . atorvastatin (LIPITOR) 40 MG tablet Take 1 tablet (40 mg total) by mouth daily at 6 PM. 30 tablet 0  . mirtazapine (REMERON) 15 MG tablet Take 1 tablet (15 mg total) by mouth at bedtime. 30 tablet 0  . nicotine (NICODERM CQ - DOSED IN MG/24 HR) 7 mg/24hr patch 7 mg patch daily for two weeks and stop 28 patch 0   No current facility-administered medications on file prior to visit.    Allergies:   Allergies  Allergen Reactions  . Mango Flavor Nausea Only and Other (See Comments)    Flushing and extreme lethargy (effect is long-lasting and familial)  . Poison Ivy Extract Rash  . Sulfa Antibiotics Rash      OBJECTIVE:  Physical Exam  Vitals:   09/22/19 1009  BP: 122/84  Pulse: 71  Temp: (!) 97.4 F (36.3 C)  Weight: 120 lb (54.4 kg)  Height: 5\' 4"  (1.626 m)   Body mass index is 20.6 kg/m. No exam data present   General: Frail pleasant elderly Caucasian female, seated, in no evident distress Head: head normocephalic and atraumatic.   Neck: supple with no carotid or supraclavicular bruits Cardiovascular: regular rate and rhythm, no murmurs Musculoskeletal: no deformity Skin:  no rash/petichiae Vascular:  Normal pulses all extremities   Neurologic Exam Mental Status: Awake and fully alert.   Fluent speech and language.  Oriented to time but unable to state office name but aware she is being seen for stroke follow-up. Recent memory impaired and remote memory intact. Attention span, concentration and fund of knowledge mostly appropriate during visit. Mood and affect appropriate.  Cranial Nerves: Fundoscopic exam reveals sharp disc margins. Pupils equal,  briskly reactive to light. Extraocular movements full without nystagmus. Visual fields full to confrontation. Hearing intact. Facial sensation intact. Mild left lower facial droop - chronic  Motor: Normal bulk and tone.  Full strength left upper and lower extremity RUE: 5/5 slightly decreased right hand strength and decreased dexterity RLE: 5/5  Sensory.: intact to touch , pinprick , position and vibratory sensation.  Coordination: Rapid alternating movements normal in all extremities except slightly decreased right hand. Finger-to-nose and heel-to-shin performed accurately bilaterally. Gait and Station: Arises from chair without difficulty. Stance is normal. Gait demonstrates  short shuffled steps with use of rolling walker.  Tandem walk not attempted. Reflexes: 1+ and symmetric. Toes downgoing.     NIHSS  0 Modified Rankin  2     ASSESSMENT: Yolanda Hopkins is a 79 y.o. year old female presented with right-sided weakness resulting in 2 falls on July 30, 2019 with stroke work-up revealing left ACA scattered infarcts secondary to large vessel arthrosclerosis with azygous ACAs, left A2 proximal high-grade stenosis and left A3 tandem stenosis. Vascular risk factors include HTN, HLD, prior stroke, dementia, tobacco use and age.  Residual deficits of mild right hand decreased dexterity and gait impairment but overall greatly recovering.  Underlying dementia which has been stable post stroke without worsening.     PLAN:  1. Left ACA stroke:  -Gait impairment, poststroke: Ongoing participation with home health therapy and advised to call office if she would like additional therapy outpatient.  Advised use of rolling walker at all times for fall prevention -Left ACA stenosis: Complete 3 months DAPT and continuation of statin.  Discussion regarding importance of managing stroke risk factors and ensuring healthy diet.  No indication to repeat imaging at this time as currently asymptomatic and  treatment plan would not change -Continue aspirin 325 mg daily and clopidogrel 75 mg daily  and atorvastatin 40 mg daily for secondary stroke prevention.  Complete DAPT for 3 months then aspirin alone (approximately 1 more month).  - Maintain strict control of hypertension with blood pressure goal below 130/90, diabetes with hemoglobin A1c goal below 6.5% and cholesterol with LDL cholesterol (bad cholesterol) goal below 70 mg/dL.  I also advised the patient to eat a healthy diet with plenty of whole grains, cereals, fruits and vegetables, exercise regularly with at  least 30 minutes of continuous activity daily and maintain ideal body weight. 2. HTN: Stable.  Continue to follow with PCP for monitoring management 3. HLD: Continuation of atorvastatin 40 mg daily with son reporting recent lipid panel by PCP satisfactory.  Advised to continue to follow with PCP for prescribing, monitoring and management 4. Left ICA stenosis: Currently asymptomatic and again discussed importance of managing stroke risk factors.  Will repeat carotid ultrasound for surveillance monitoring around 01/2020 (6 months from recent CTA) 5. Dementia: Underlying dementia likely vascular from multiple strokes and MRI revealing generalized atrophy and multiple chronic microhemorrhages.  Son reports stable cognition since recent stroke without worsening.  No additional behavioral concerns with ongoing use of mirtazapine managed by PCP.    Follow up in 3 months or call earlier if needed   I spent 45 minutes of face-to-face and non-face-to-face time with patient and son.  This included previsit chart review, lab review, study review, order entry, electronic health record documentation, patient education regarding recent stroke, residual deficits, underlying dementia, importance of managing stroke risk factors and answered all questions to patient and son's satisfaction     Ihor Austin, AGNP-BC  [facility] [address] [place], [place] [address]  Phone [phone] Fax [phone] Note: This document was prepared with digital dictation and possible smart phrase technology. Any transcriptional errors that result from this process are unintentional.

## 2019-09-22 NOTE — Patient Instructions (Signed)
Continue aspirin 325 mg daily and clopidogrel 75 mg daily  and atorvastatin 40mg  daily  for secondary stroke prevention  Stop plavix after 1 more month and then aspirin alone  Continue to work with home therapies for ongoing improvement - please call office if you would like additional outpatient hterapies  Continue to follow up with PCP regarding cholesterol and blood pressure management   Continue to monitor blood pressure at home  Maintain strict control of hypertension with blood pressure goal below 130/90, diabetes with hemoglobin A1c goal below 6.5% and cholesterol with LDL cholesterol (bad cholesterol) goal below 70 mg/dL. I also advised the patient to eat a healthy diet with plenty of whole grains, cereals, fruits and vegetables, exercise regularly and maintain ideal body weight.  Followup in the future with me in 3 months or call earlier if needed       Thank you for coming to see at [facility]. I hope we have been able to provide you high quality care today.  You may receive a patient satisfaction survey over the next few weeks. We would appreciate your feedback and comments so that we may continue to improve ourselves and the health of our patients.

## 2019-09-23 NOTE — Progress Notes (Signed)
I agree with the above plan 

## 2019-10-14 ENCOUNTER — Other Ambulatory Visit: Payer: Self-pay

## 2019-10-14 ENCOUNTER — Encounter: Payer: Medicare Other | Attending: Registered Nurse | Admitting: Physical Medicine & Rehabilitation

## 2019-10-14 ENCOUNTER — Encounter: Payer: Self-pay | Admitting: Physical Medicine & Rehabilitation

## 2019-10-14 VITALS — BP 135/73 | HR 68 | Temp 97.7°F | Ht 64.0 in | Wt 120.0 lb

## 2019-10-14 DIAGNOSIS — I63522 Cerebral infarction due to unspecified occlusion or stenosis of left anterior cerebral artery: Secondary | ICD-10-CM | POA: Insufficient documentation

## 2019-10-14 DIAGNOSIS — E785 Hyperlipidemia, unspecified: Secondary | ICD-10-CM | POA: Insufficient documentation

## 2019-10-14 DIAGNOSIS — Z8679 Personal history of other diseases of the circulatory system: Secondary | ICD-10-CM | POA: Insufficient documentation

## 2019-10-14 DIAGNOSIS — Z8673 Personal history of transient ischemic attack (TIA), and cerebral infarction without residual deficits: Secondary | ICD-10-CM

## 2019-10-14 NOTE — Progress Notes (Signed)
Subjective:    Patient ID: Yolanda Hopkins, female    DOB: 10-15-1940, 79 y.o.   MRN: [id number] 79 y.o. right-handed female with history of hypertension, depression, hyperlipidemia, tobacco abuse and vertigo.  Presented 07/31/2019 with right-sided weakness as well as falls x2 with altered mental status.  Cranial CT scan negative for acute changes.  Patient did not receive TPA.  MRI of the brain showed multiple small acute infarcts within the left frontal white matter in the left anterior cerebral artery territory.  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  CT angiogram of head and neck no intracranial arterial occlusion or high-grade stenosis.  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  Echocardiogram with ejection fraction of 60%.  Admission chemistries unremarkable SARS coronavirus negative.  Neurology follow-up maintained on aspirin 325 mg and Plavix for CVA prophylaxis x3 months and aspirin alone due to severe left ACA stenosis.  Subcutaneous Lovenox for DVT prophylaxis.  Patient was admitted for a comprehensive rehab program.   Admit date: 08/05/2019 Discharge date: 08/20/2019 HPI Seen by Neuro, PCP.   HHPT,OT , but haven't seen her in 2 wks Daughter stays with her during the day as well as son's partner. No falls since last visit with NP Walker use is inconsistent   Hx Right hip fx  Pain Inventory Average Pain 0 Pain Right Now 0 My pain is no pain  In the last 24 hours, has pain interfered with the following? General activity 0 Relation with others 0 Enjoyment of life 0 What TIME of day is your pain at its worst? no pain Sleep (in general) Good  Pain is worse with: no pain Pain improves with: no pain Relief from Meds: no pain  Mobility walk with assistance use a walker ability to climb steps?  yes do you drive?  no  Function not employed: date last employed . I need assistance with the following:  dressing,  toileting, meal prep, household duties and shopping  Neuro/Psych bowel control problems weakness  Prior Studies Any changes since last visit?  no  Physicians involved in your care Any changes since last visit?  no   Family History  Problem Relation Age of Onset  . Hypertension Mother   . Hypertension Father   . Liver cancer Father    Social History   Socioeconomic History  . Marital status: Married    Spouse name: Not on file  . Number of children: Not on file  . Years of education: Not on file  . Highest education level: Not on file  Occupational History  . Not on file  Tobacco Use  . Smoking status: Current Some Day Smoker    Types: Cigarettes  . Smokeless tobacco: Never Used  Substance and Sexual Activity  . Alcohol use: No  . Drug use: No  . Sexual activity: Not on file  Other Topics Concern  . Not on file  Social History Narrative  . Not on file   Social Determinants of Health   Financial Resource Strain:   . Difficulty of Paying Living Expenses:   Food Insecurity:   . Worried About Charity fundraiser in the Last Year:   . Arboriculturist in the Last Year:   Transportation Needs:   . Film/video editor (Medical):   Marland Kitchen Lack of Transportation (Non-Medical):   Physical Activity:   . Days of Exercise per Week:   . Minutes of Exercise  per Session:   Stress:   . Feeling of Stress :   Social Connections:   . Frequency of Communication with Friends and Family:   . Frequency of Social Gatherings with Friends and Family:   . Attends Religious Services:   . Active Member of Clubs or Organizations:   . Attends Banker Meetings:   Marland Kitchen Marital Status:    Past Surgical History:  Procedure Laterality Date  . COLONOSCOPY N/A 05/23/2016   Procedure: COLONOSCOPY;  Surgeon: Jeani Hawking, MD;  Location: [facility] ENDOSCOPY;  Service: Endoscopy;  Laterality: N/A;  . ESOPHAGOGASTRODUODENOSCOPY N/A 05/23/2016   Procedure: ESOPHAGOGASTRODUODENOSCOPY (EGD);   Surgeon: Jeani Hawking, MD;  Location: [facility] ENDOSCOPY;  Service: Endoscopy;  Laterality: N/A;  . GIVENS CAPSULE STUDY N/A 05/23/2016   Procedure: GIVENS CAPSULE STUDY;  Surgeon: Jeani Hawking, MD;  Location: [facility] ENDOSCOPY;  Service: Endoscopy;  Laterality: N/A;   Past Medical History:  Diagnosis Date  . Hypertension   . Tobacco abuse 08/19/2013  . Vertigo    BP 135/73   Pulse 68   Temp 97.7 F (36.5 C)   Ht 5\' 4"  (1.626 m)   Wt 120 lb (54.4 kg) Comment: last recorded  SpO2 96%   BMI 20.60 kg/m   Opioid Risk Score:   Fall Risk Score:  `1  Depression screen PHQ 2/9  Depression screen PHQ 2/9 09/02/2019  Decreased Interest 3  Down, Depressed, Hopeless 3  PHQ - 2 Score 6  Altered sleeping 0  Tired, decreased energy 3  Change in appetite 0  Feeling bad or failure about yourself  3  Trouble concentrating 0  Moving slowly or fidgety/restless 3  Suicidal thoughts 0  PHQ-9 Score 15  Difficult doing work/chores Somewhat difficult    Review of Systems  Constitutional: Negative.   HENT: Negative.   Eyes: Negative.   Respiratory: Negative.   Cardiovascular: Negative.   Gastrointestinal: Positive for constipation.  Endocrine: Negative.   Genitourinary: Negative.   Musculoskeletal: Negative.   Skin: Negative.   Allergic/Immunologic: Negative.   Neurological: Positive for weakness.  Hematological: Bruises/bleeds easily.       Plavix  Psychiatric/Behavioral: Negative.   All other systems reviewed and are negative.      Objective:   Physical Exam        Assessment & Plan:  PT not seen , left prior to seeing MD

## 2019-10-14 NOTE — Patient Instructions (Signed)
Discontinue Clopidigrel in 2wks

## 2019-11-29 ENCOUNTER — Encounter: Payer: Self-pay | Admitting: Podiatry

## 2019-11-29 ENCOUNTER — Ambulatory Visit (INDEPENDENT_AMBULATORY_CARE_PROVIDER_SITE_OTHER): Payer: Medicare Other | Admitting: Podiatry

## 2019-11-29 ENCOUNTER — Other Ambulatory Visit: Payer: Self-pay

## 2019-11-29 DIAGNOSIS — B351 Tinea unguium: Secondary | ICD-10-CM

## 2019-11-29 DIAGNOSIS — L84 Corns and callosities: Secondary | ICD-10-CM | POA: Diagnosis not present

## 2019-11-29 DIAGNOSIS — M79674 Pain in right toe(s): Secondary | ICD-10-CM | POA: Diagnosis not present

## 2019-11-29 DIAGNOSIS — D689 Coagulation defect, unspecified: Secondary | ICD-10-CM

## 2019-11-29 DIAGNOSIS — M79675 Pain in left toe(s): Secondary | ICD-10-CM

## 2019-11-29 DIAGNOSIS — I739 Peripheral vascular disease, unspecified: Secondary | ICD-10-CM

## 2019-11-29 NOTE — Progress Notes (Signed)
Subjective:  Patient ID: Yolanda Hopkins, female    DOB: Jul 02, 1940,  MRN: [id number]  79 y.o. female presents with painful corn(s) b/l and callus(es) right foot and painful mycotic nails.  Pain interferes with ambulation. Aggravating factors include wearing enclosed shoe gear. 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 Patient is on long term blood thinner, Plavix.  Her son is present during today's visit. They voice no new pedal problems on today's visit. Son states she likes to walk barefoot.  Review of Systems: Negative except as noted in the HPI.  Past Medical History:  Diagnosis Date  . Hypertension   . Tobacco abuse 08/19/2013  . Vertigo    Past Surgical History:  Procedure Laterality Date  . COLONOSCOPY N/A 05/23/2016   Procedure: COLONOSCOPY;  Surgeon: Jeani Hawking, MD;  Location: [facility] ENDOSCOPY;  Service: Endoscopy;  Laterality: N/A;  . ESOPHAGOGASTRODUODENOSCOPY N/A 05/23/2016   Procedure: ESOPHAGOGASTRODUODENOSCOPY (EGD);  Surgeon: Jeani Hawking, MD;  Location: [facility] ENDOSCOPY;  Service: Endoscopy;  Laterality: N/A;  . GIVENS CAPSULE STUDY N/A 05/23/2016   Procedure: GIVENS CAPSULE STUDY;  Surgeon: Jeani Hawking, MD;  Location: [facility] ENDOSCOPY;  Service: Endoscopy;  Laterality: N/A;   Patient Active Problem List   Diagnosis Date Noted  . Incomplete defecation   . Slow transit constipation   . History of hypertension   . Urinary frequency   . Urinary incontinence   . Acute CVA (cerebrovascular accident) (HCC) 07/30/2019  . Acute cystitis without hematuria 04/18/2019  . Closed displaced fracture of right femoral neck (HCC) 04/18/2019  . Dementia (HCC) 04/18/2019  . Fall 04/18/2019  . GI bleed 05/22/2016  . Pressure injury of skin 05/22/2016  . Loss of weight   . Dyslipidemia   . Hyperlipidemia 08/30/2013  . 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 HTN (hypertension) 08/19/2013  . Tobacco abuse 08/19/2013  . Vertigo 08/19/2013  . Gait abnormality 08/19/2013  .  CVA (cerebral infarction) 08/19/2013    Current Outpatient Medications:  .  acetaminophen (TYLENOL) 325 MG tablet, Take 2 tablets (650 mg total) by mouth every 4 (four) hours as needed for mild pain (or temp > 37.5 C (99.5 F))., Disp:  , Rfl:  .  aspirin EC 325 MG EC tablet, Take 1 tablet (325 mg total) by mouth daily., Disp: 30 tablet, Rfl: 0 .  atorvastatin (LIPITOR) 40 MG tablet, Take 1 tablet (40 mg total) by mouth daily at 6 PM., Disp: 30 tablet, Rfl: 0 .  clopidogrel (PLAVIX) 75 MG tablet, Take 1 tablet (75 mg total) by mouth daily., Disp: 30 tablet, Rfl: 0 .  mirtazapine (REMERON) 15 MG tablet, Take 1 tablet (15 mg total) by mouth at bedtime., Disp: 30 tablet, Rfl: 0 .  nicotine (NICODERM CQ - DOSED IN MG/24 HOURS) 14 mg/24hr patch, 14 mg daily., Disp: , Rfl:  .  nicotine (NICODERM CQ - DOSED IN MG/24 HR) 7 mg/24hr patch, 7 mg patch daily for two weeks and stop, Disp: 28 patch, Rfl: 0 Allergies  Allergen Reactions  . Mango Flavor Nausea Only and Other (See Comments)    Flushing and extreme lethargy (effect is long-lasting and familial)  . Poison Ivy Extract Rash  . Sulfa Antibiotics Rash   Social History   Occupational History  . Not on file  Tobacco Use  . Smoking status: Current Some Day Smoker    Types: Cigarettes  . Smokeless tobacco: Never Used  Substance and Sexual Activity  . Alcohol use: No  . Drug  use: No  . Sexual activity: Not on file    Objective:   Constitutional Pt is a pleasant 79 y.o. Caucasian female WD, WN in NAD.Marland Kitchen AAO x 3.   Vascular Capillary refill time to digits immediate b/l. Palpable PT pulse(s) b/l lower extremities Faintly palpable DP pulse(s) b/l lower extremities. Pedal hair absent. Lower extremity skin temperature gradient warm to cool. No pain with calf compression b/l. Trace edema noted b/l lower extremities. No ischemia or gangrene noted b/l lower extremities. No cyanosis or clubbing noted.  Neurologic Normal speech. Oriented to person,  place, and time. Protective sensation intact 5/5 intact bilaterally with 10g monofilament b/l. Babinski reflex negative b/l. Clonus negative b/l.  Dermatologic Pedal skin is thin shiny, atrophic b/l lower extremities. No open wounds bilaterally. No interdigital macerations bilaterally. Toenails 1-5 b/l elongated, discolored, dystrophic, thickened, crumbly with subungual debris and tenderness to dorsal palpation. Hyperkeratotic lesion(s) L 5th toe, R 5th toe and submet head 1 right foot.  No erythema, no edema, no drainage, no flocculence.  Orthopedic: Normal muscle strength 5/5 to all lower extremity muscle groups bilaterally. No pain crepitus or joint limitation noted with ROM b/l. Hammertoes noted to the L 5th toe and R 5th toe. Utilizes walker for ambulation assistance.   Radiographs: None Assessment:   1. Pain due to onychomycosis of toenails of both feet   2. Corns and callosities   3. Clotting disorder (HCC)   4. PAD (peripheral artery disease) (HCC)    Plan:  Patient was evaluated and treated and all questions answered.  Onychomycosis with pain -Nails palliatively debridement as below. -Educated on self-care  Procedure: Nail Debridement Rationale: Pain Type of Debridement: manual, sharp debridement. Instrumentation: Nail nipper, rotary burr. Number of Nails: 10  -Examined patient. Advised pt to wear her shoes.  -Toenails 1-5 b/l were debrided in length and girth with sterile nail nippers and dremel without iatrogenic bleeding.  -Corn(s) L 5th toe and R 5th toe and callus(es) submet head 1 right foot were pared utilizing sterile scalpel blade without incident. Total number debrided =3. -Patient to report any pedal injuries to medical professional immediately. -Patient to continue soft, supportive shoe gear daily. -Patient/POA to call should there be question/concern in the interim.  Return in about 3 months (around 02/29/2020) for nail and callus trim/ Plavix.  Freddie Breech, DPM

## 2020-01-06 ENCOUNTER — Telehealth: Payer: Self-pay | Admitting: Adult Health

## 2020-01-06 ENCOUNTER — Ambulatory Visit: Payer: Medicare Other | Admitting: Adult Health

## 2020-02-09 ENCOUNTER — Other Ambulatory Visit: Payer: Self-pay

## 2020-02-09 ENCOUNTER — Emergency Department (INDEPENDENT_AMBULATORY_CARE_PROVIDER_SITE_OTHER): Payer: Medicare Other

## 2020-02-09 ENCOUNTER — Emergency Department (INDEPENDENT_AMBULATORY_CARE_PROVIDER_SITE_OTHER)
Admission: EM | Admit: 2020-02-09 | Discharge: 2020-02-09 | Disposition: A | Discharge status: Home / Self Care | Payer: Medicare Other | Source: Home / Self Care

## 2020-02-09 ENCOUNTER — Encounter: Payer: Self-pay | Admitting: Adult Health

## 2020-02-09 ENCOUNTER — Ambulatory Visit (INDEPENDENT_AMBULATORY_CARE_PROVIDER_SITE_OTHER): Payer: Medicare Other | Admitting: Adult Health

## 2020-02-09 VITALS — BP 122/69 | HR 74 | Ht 60.0 in | Wt 128.0 lb

## 2020-02-09 DIAGNOSIS — I6523 Occlusion and stenosis of bilateral carotid arteries: Secondary | ICD-10-CM

## 2020-02-09 DIAGNOSIS — I10 Essential (primary) hypertension: Secondary | ICD-10-CM | POA: Diagnosis not present

## 2020-02-09 DIAGNOSIS — W19XXXA Unspecified fall, initial encounter: Secondary | ICD-10-CM

## 2020-02-09 DIAGNOSIS — E782 Mixed hyperlipidemia: Secondary | ICD-10-CM | POA: Diagnosis not present

## 2020-02-09 DIAGNOSIS — S79911A Unspecified injury of right hip, initial encounter: Secondary | ICD-10-CM

## 2020-02-09 DIAGNOSIS — M25511 Pain in right shoulder: Secondary | ICD-10-CM | POA: Diagnosis not present

## 2020-02-09 DIAGNOSIS — F01518 Vascular dementia, unspecified severity, with other behavioral disturbance: Secondary | ICD-10-CM

## 2020-02-09 DIAGNOSIS — I63522 Cerebral infarction due to unspecified occlusion or stenosis of left anterior cerebral artery: Secondary | ICD-10-CM | POA: Diagnosis not present

## 2020-02-09 DIAGNOSIS — F0151 Vascular dementia with behavioral disturbance: Secondary | ICD-10-CM

## 2020-02-09 HISTORY — DX: Urinary tract infection, site not specified: N39.0

## 2020-02-09 HISTORY — DX: Cerebral infarction, unspecified: I63.9

## 2020-02-09 HISTORY — DX: Unspecified dementia, unspecified severity, without behavioral disturbance, psychotic disturbance, mood disturbance, and anxiety: F03.90

## 2020-02-09 MED ORDER — TIZANIDINE HCL 2 MG PO TABS: 2.0000 mg | ORAL_TABLET | Freq: Two times a day (BID) | ORAL | 0 refills | Status: DC | PRN

## 2020-02-09 NOTE — Progress Notes (Signed)
[facility] [address] [place]. [place] [address] [phone]       STROKE FOLLOW UP NOTE  Ms. DARYA BIGLER Date of Birth:  1940/09/19 Medical Record Number:  [id number]   Reason for Referral: stroke follow up    SUBJECTIVE:   CHIEF COMPLAINT:  Stroke   HPI:   Today, 02/09/2020, Ms. Pondexter returns for stroke follow-up accompanied by her son.  Stable since prior visit - denies residual deficits. Denies new or worsening stroke/TIA symptoms.  Ambulates with rolling walker short distance and wheelchair for long distance.  Remains on mirtazapine per PCP for dementia related behaviors. Per son, cognition has improved since prior visit.  Continues on aspirin 325 mg daily without bleeding or bruising.  Continues on atorvastatin 40 mg daily without myalgias.  Blood pressure today 122/69.  No concerns at this time.    History provided for reference purposes only Update 09/22/2019 JM: Ms. Riggan is being seen for hospital follow-up accompanied by her son.  She has been stable since discharge with residual stroke deficits of mild right sided weakness. She continues to work with [facility] PT with ongoing improvement. Ongoing use of rolling walker.  Cognition has been stable and denies worsening.  Initial concerns of dementia related behaviors but still endorses great improvement and continues on mirtazapine currently managed by PCP.  Denies new or worsening stroke/TIA symptoms. Continues DAPT with aspirin 325 mg daily and clopidogrel 75 mg daily without bleeding or bruising.  Continues on atorvastatin 40 mg daily without myalgias. Lipid panel checked recently by PCP and son reports satisfactory levels.  Blood pressure today 122/84.  No concerns at this time.  Stroke admission 07/30/2019 Ms. JALEIGH MCCROSKEY is a 79 y.o. female with history of hypertension, dementia, previous stroke, vertigo and tobacco use,  who presented on July 30, 2019 with right sided weakness and 2 falls.    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  Initiated DAPT for 3 months then aspirin alone due to severe left ACA stenosis.  Prior stroke history 08/2013 left BG infarct.  History of HTN stable.  History of HLD with LDL 158 and changed statin from Crestor to atorvastatin 40 mg daily.  Other stroke risk factors include underlying dementia, current tobacco use and advanced age.  Residual deficits of right hemiparesis and evaluated by therapies who recommended discharge to CIR for ongoing therapy needs.  She was eventually discharged home with recommendation of home health therapies on August 20, 2019.  Strokes:  Left ACA scattered infarcts, likely due to large vessel atherosclerosis.  Resultant abulia, flat affect, right hemiparesis  CT head - No CT evidence for acute intracranial abnormality.   MRI head - Multiple small acute infarcts within the left frontal white matter, in the left anterior cerebral artery territory. 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  2D Echo - EF 55 to 60%. No cardiac source of emboli identified.   [employer] Virus 2 - not ordered  LDL - 158 -initiated atorvastatin 40 mg daily (d/c home crestor)  HgbA1c - 5.3  VTE prophylaxis - Lovenox  No antithrombotic prior to admission, now on  aspirin 325 mg daily and clopidogrel 75 mg daily DAPT for 3 months and then aspirin alone due to severe left ACA stenosis.  Patient counseled to be compliant with her antithrombotic medications  Ongoing aggressive stroke risk factor management  Therapy recommendations:  CIR  Disposition:   CIR      ROS:   14 system review of systems performed and negative with exception of gait impairment, memory loss and confusion  PMH:  Past Medical History:  Diagnosis Date  . Dementia (HCC)   . Hypertension   . Stroke (cerebrum) [facility])    March 2021  . Tobacco abuse 08/19/2013  . UTI (urinary tract infection)    frequent  . Vertigo     PSH:  Past Surgical History:  Procedure Laterality Date  . COLONOSCOPY N/A 05/23/2016   Procedure: COLONOSCOPY;  Surgeon: Jeani Hawking, MD;  Location: [facility] ENDOSCOPY;  Service: Endoscopy;  Laterality: N/A;  . ESOPHAGOGASTRODUODENOSCOPY N/A 05/23/2016   Procedure: ESOPHAGOGASTRODUODENOSCOPY (EGD);  Surgeon: Jeani Hawking, MD;  Location: [facility] ENDOSCOPY;  Service: Endoscopy;  Laterality: N/A;  . GIVENS CAPSULE STUDY N/A 05/23/2016   Procedure: GIVENS CAPSULE STUDY;  Surgeon: Jeani Hawking, MD;  Location: [facility] ENDOSCOPY;  Service: Endoscopy;  Laterality: N/A;    Social History:  Social History   Socioeconomic History  . Marital status: Married    Spouse name: Not on file  . Number of children: Not on file  . Years of education: Not on file  . Highest education level: Not on file  Occupational History  . Not on file  Tobacco Use  . Smoking status: Current Some Day Smoker    Packs/day: 0.50    Years: 55.00    Pack years: 27.50    Types: Cigarettes  . Smokeless tobacco: Never Used  Substance and Sexual Activity  . Alcohol use: No  . Drug use: No  . Sexual activity: Not on file  Other Topics Concern  . Not on file  Social History Narrative  . Not on file   Social Determinants of Health   Financial Resource Strain:   . Difficulty of Paying Living Expenses: Not on file  Food Insecurity:   . Worried About Programme researcher, broadcasting/film/video in the Last Year: Not on file  . Ran Out of Food in the Last Year: Not on file  Transportation Needs:   . Lack of Transportation (Medical): Not on file  . Lack of Transportation (Non-Medical): Not on file  Physical  Activity:   . Days of Exercise per Week: Not on file  . Minutes of Exercise per Session: Not on file  Stress:   . Feeling of Stress : Not on file  Social Connections:   . Frequency of Communication with Friends and Family: Not on file  . Frequency of Social Gatherings with Friends and Family: Not on file  . Attends Religious Services: Not on file  . Active Member of Clubs or Organizations: Not on file  . Attends Banker Meetings: Not on file  . Marital Status: Not on file  Intimate Partner Violence:   . Fear of Current or Ex-Partner: Not on file  . Emotionally Abused: Not on file  . Physically Abused: Not on file  . Sexually Abused: Not on file    Family History:  Family History  Problem Relation Age of Onset  . Hypertension Mother   . Hypertension Father   . Liver cancer Father     Medications:   Current Outpatient Medications on  File Prior to Visit  Medication Sig Dispense Refill  . acetaminophen (TYLENOL) 325 MG tablet Take 2 tablets (650 mg total) by mouth every 4 (four) hours as needed for mild pain (or temp > 37.5 C (99.5 F)).    Marland Kitchen aspirin EC 325 MG EC tablet Take 1 tablet (325 mg total) by mouth daily. 30 tablet 0  . atorvastatin (LIPITOR) 40 MG tablet Take 1 tablet (40 mg total) by mouth daily at 6 PM. 30 tablet 0  . mirtazapine (REMERON) 15 MG tablet Take 1 tablet (15 mg total) by mouth at bedtime. 30 tablet 0  . nicotine (NICODERM CQ - DOSED IN MG/24 HOURS) 14 mg/24hr patch 14 mg daily.    . nicotine (NICODERM CQ - DOSED IN MG/24 HR) 7 mg/24hr patch 7 mg patch daily for two weeks and stop 28 patch 0   No current facility-administered medications on file prior to visit.    Allergies:   Allergies  Allergen Reactions  . Mango Flavor Nausea Only and Other (See Comments)    Flushing and extreme lethargy (effect is long-lasting and familial)  . Poison Ivy Extract Rash  . Sulfa Antibiotics Rash      OBJECTIVE:  Physical Exam  Vitals:   02/09/20  1419  BP: 122/69  Pulse: 74  Weight: 128 lb (58.1 kg)  Height: 5' (1.524 m)   Body mass index is 25 kg/m. No exam data present   General: Frail pleasant elderly Caucasian female, seated, in no evident distress Head: head normocephalic and atraumatic.   Neck: supple with no carotid or supraclavicular bruits Cardiovascular: regular rate and rhythm, no murmurs Musculoskeletal: no deformity Skin:  no rash/petichiae Vascular:  Normal pulses all extremities   Neurologic Exam Mental Status: Awake and fully alert. Fluent speech and language.  Oriented to time but unable to state office name but aware she is being seen for stroke follow-up. Recent memory impaired and remote memory intact. Attention span, concentration and fund of knowledge mostly appropriate during visit. Mood and affect appropriate.  Cranial Nerves: Pupils equal, briskly reactive to light. Extraocular movements full without nystagmus. Visual fields full to confrontation. Hearing intact. Facial sensation intact. Mild left lower facial droop - chronic  Motor: Normal bulk and tone.  Full strength in all tested extremities Sensory.: intact to touch , pinprick , position and vibratory sensation.  Coordination: Rapid alternating movements normal in all extremities. Finger-to-nose and heel-to-shin performed accurately bilaterally. Gait and Station: Arises from chair without difficulty. Stance is normal. Gait demonstrates  short shuffled steps with use of rolling walker.  Tandem walk not attempted. Reflexes: 1+ and symmetric. Toes downgoing.       ASSESSMENT/PLAN: MARYFER TAUZIN is a 79 y.o. year old female presented with right-sided weakness resulting in 2 falls on July 30, 2019 with stroke work-up revealing left ACA scattered infarcts secondary to large vessel arthrosclerosis with azygous ACAs, left A2 proximal high-grade stenosis and left A3 tandem stenosis. Vascular risk factors include HTN, HLD, prior stroke, dementia,  tobacco use and age.     1. Left ACA stroke:  a. Recovered well without residual deficits b. Continue aspirin 325 mg daily  and atorvastatin 40 mg daily for secondary stroke prevention. c. Discussed secondary stroke prevention measures and importance of close follow-up with PCP for aggressive stroke risk factor management 2. HTN: BP<130/90. Stable.  Managed by PCP. 3. HLD: LDL goal<70.  Continue atorvastatin 40 mg daily managed by PCP 4. Left ICA stenosis: Asymptomatic.  Obtain carotid  ultrasound for surveillance monitoring. 5. Dementia: Stable per son.  On mirtazapine for behaviors per PCP.  managed by PCP.    Follow up in 6 months or call earlier if needed   I spent 30 minutes of face-to-face and non-face-to-face time with patient and son.  This included previsit chart review, lab review, study review, order entry, electronic health record documentation, patient education regarding recent stroke, residual deficits, underlying dementia, importance of managing stroke risk factors and answered all questions to patient and son's satisfaction   Ihor AustinJessica McCue, AGNP-BC  [facility] [address] [place], [place] [id number]  Phone [phone] Fax [phone] Note: This document was prepared with digital dictation and possible smart phrase technology. Any transcriptional errors that result from this process are unintentional.

## 2020-02-09 NOTE — ED Triage Notes (Signed)
Pt presents to Urgent Care with c/o R hip pain after falling 2 weeks ago. Has recent history (04/2019) of R hip fx and replacement.

## 2020-02-09 NOTE — Patient Instructions (Addendum)
Continue aspirin 325 mg daily  and atorvastatin 40 mg daily for secondary stroke prevention  Obtain carotid ultrasound for surveillance monitoring of left carotid stenosis  Continue to follow up with PCP regarding cholesterol and blood pressure management  Maintain strict control of hypertension with blood pressure goal below 130/90 and cholesterol with LDL cholesterol (bad cholesterol) goal below 70 mg/dL.       Follow up in 6 months or call earlier if needed      Thank you for coming to see [place] at [facility]. I hope we have been able to provide you high quality care today.  You may receive a patient satisfaction survey over the next few weeks. We would appreciate your feedback and comments so that we may continue to improve ourselves and the health of our patients.

## 2020-02-09 NOTE — Discharge Instructions (Addendum)
No fracture or misalignment.

## 2020-02-09 NOTE — ED Provider Notes (Signed)
[facility]    CSN: [id number] Arrival date & time: 02/09/20  1527      History   Chief Complaint Chief Complaint  Patient presents with  . Hip Pain    Right    HPI Yolanda Hopkins is a 79 y.o. female.   HPI  Right hip pain since fall 2 weeks ago. Accompanied by caregiver, he explained patient has dementia and has occasional falls. She underwent a right hip replacement following a fall earlier in the year. Two weeks ago she sustained fall landing on right hip and since that time has complained aching and pain of the right heel.  She takes Tylenol 3 times daily every day for pain.  She ambulates with a walker which is at her baseline.  She is caregiver was concerned that she further injured her hip during recent falls and request an x-ray.  Patient also has a history of frequent UTIs however is unable to provide a urine sample during today's encounter.   Past Medical History:  Diagnosis Date  . Dementia (HCC)   . Hypertension   . Stroke (cerebrum) [facility])    March 2021  . Tobacco abuse 08/19/2013  . UTI (urinary tract infection)    frequent  . Vertigo     Patient Active Problem List   Diagnosis Date Noted  . Incomplete defecation   . Slow transit constipation   . History of hypertension   . Urinary frequency   . Urinary incontinence   . Acute CVA (cerebrovascular accident) (HCC) 07/30/2019  . Acute cystitis without hematuria 04/18/2019  . Closed displaced fracture of right femoral neck (HCC) 04/18/2019  . Dementia (HCC) 04/18/2019  . Fall 04/18/2019  . GI bleed 05/22/2016  . Pressure injury of skin 05/22/2016  . Loss of weight   . Dyslipidemia   . Hyperlipidemia 08/30/2013  . 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 HTN (hypertension) 08/19/2013  . Tobacco abuse 08/19/2013  . Vertigo 08/19/2013  . Gait abnormality 08/19/2013  . CVA (cerebral infarction) 08/19/2013    Past Surgical History:  Procedure Laterality Date  . COLONOSCOPY N/A  05/23/2016   Procedure: COLONOSCOPY;  Surgeon: Jeani Hawking, MD;  Location: [facility] ENDOSCOPY;  Service: Endoscopy;  Laterality: N/A;  . ESOPHAGOGASTRODUODENOSCOPY N/A 05/23/2016   Procedure: ESOPHAGOGASTRODUODENOSCOPY (EGD);  Surgeon: Jeani Hawking, MD;  Location: [facility] ENDOSCOPY;  Service: Endoscopy;  Laterality: N/A;  . GIVENS CAPSULE STUDY N/A 05/23/2016   Procedure: GIVENS CAPSULE STUDY;  Surgeon: Jeani Hawking, MD;  Location: [facility] ENDOSCOPY;  Service: Endoscopy;  Laterality: N/A;    OB History   No obstetric history on file.      Home Medications    Prior to Admission medications   Medication Sig Start Date End Date Taking? Authorizing Provider  acetaminophen (TYLENOL) 325 MG tablet Take 2 tablets (650 mg total) by mouth every 4 (four) hours as needed for mild pain (or temp > 37.5 C (99.5 F)). 08/19/19  Yes Angiulli, Mcarthur Rossetti, PA-C  aspirin EC 325 MG EC tablet Take 1 tablet (325 mg total) by mouth daily. 08/05/19  Yes Kathlen Mody, MD  atorvastatin (LIPITOR) 40 MG tablet Take 1 tablet (40 mg total) by mouth daily at 6 PM. 08/19/19  Yes Angiulli, Mcarthur Rossetti, PA-C  mirtazapine (REMERON) 15 MG tablet Take 1 tablet (15 mg total) by mouth at bedtime. 08/19/19  Yes Angiulli, Mcarthur Rossetti, PA-C  nicotine (NICODERM CQ - DOSED IN MG/24 HOURS) 14 mg/24hr patch 14 mg daily. 11/10/19  Yes [provider]  nicotine (NICODERM CQ - DOSED IN MG/24 HR) 7 mg/24hr patch 7 mg patch daily for two weeks and stop 08/19/19  Yes Angiulli, Mcarthur Rossetti, PA-C    Family History Family History  Problem Relation Age of Onset  . Hypertension Mother   . Hypertension Father   . Liver cancer Father     Social History Social History   Tobacco Use  . Smoking status: Current Some Day Smoker    Packs/day: 0.50    Years: 55.00    Pack years: 27.50    Types: Cigarettes  . Smokeless tobacco: Never Used  Substance Use Topics  . Alcohol use: No  . Drug use: No     Allergies   Mango flavor, Poison ivy extract, and Sulfa  antibiotics Review of Systems Review of Systems Pertinent negatives listed in HPI  Physical Exam Triage Vital Signs ED Triage Vitals  Enc Vitals Group     BP 02/09/20 1606 (!) 153/84     Pulse Rate 02/09/20 1606 73     Resp 02/09/20 1606 20     Temp 02/09/20 1606 98.7 F (37.1 C)     Temp Source 02/09/20 1606 Oral     SpO2 02/09/20 1606 97 %     Weight --      Height --      Head Circumference --      Peak Flow --      Pain Score 02/09/20 1553 5     Pain Loc --      Pain Edu? --      Excl. in GC? --    No data found.  Updated Vital Signs BP (!) 153/84 (BP Location: Right Arm)   Pulse 73   Temp 98.7 F (37.1 C) (Oral)   Resp 20   SpO2 97%   Visual Acuity Right Eye Distance:   Left Eye Distance:   Bilateral Distance:    Right Eye Near:   Left Eye Near:    Bilateral Near:     Physical Exam General appearance: alert, chronic ill appearing, without distress Head: Normocephalic, without obvious abnormality, atraumatic Respiratory: Respirations even and unlabored, normal respiratory rate Heart: Rate and rhythm normal. No gallop or murmurs noted on exam  Musculoskeletal: hip joint tenderness present   Skin: Skin color, texture, turgor normal. No rashes seen Neurologic: abnormal antalgic gait, asymmetric BLE movement , generalized weakness  Psych: at baseline, appropriate  UC Treatments / Results  Labs (all labs ordered are listed, but only abnormal results are displayed) Labs Reviewed - No data to display  EKG   Radiology DG HIP UNILAT WITH PELVIS 2-3 VIEWS RIGHT  Result Date: 02/09/2020 CLINICAL DATA:  Hip replacement.  Fall 2 weeks ago. EXAM: DG HIP (WITH OR WITHOUT PELVIS) 2-3V RIGHT COMPARISON:  07/30/2019 FINDINGS: Changes of right hip replacement. Stable appearance. No hardware complicating feature. No acute bony abnormality. Specifically, no fracture, subluxation, or dislocation. IMPRESSION: Right hip replacement.  No acute bony abnormality.  Electronically Signed   By: Charlett Nose M.D.   On: 02/09/2020 16:48   Procedures Procedures (including critical care time)  Medications Ordered in UC Medications - No data to display  Initial Impression / Assessment and Plan / UC Course  I have reviewed the triage vital signs and the nursing notes.  Pertinent labs & imaging results that were available during my care of the patient were reviewed by me and considered in my medical decision making (see chart for  details).    Acute on chronic right hip pain, right hip x-ray shows no misalignment hardware with a stable appearance.  Pain is likely related to soft tissue injury related to fall and/or chronic arthritic changes.  Recommend continue Tylenol added tizanidine as needed caution the medication causes severe drowsiness only give at bedtime and monitor patient for adverse side effects while taking medication.  Patient's caregiver verbalized understanding and agreement with plan. Final Clinical Impressions(s) / UC Diagnoses   Final diagnoses:  Fall  Injury of right hip     Discharge Instructions     No fracture or misalignment.     ED Prescriptions    Medication Sig Dispense Auth. Provider   tiZANidine (ZANAFLEX) 2 MG tablet Take 1 tablet (2 mg total) by mouth 2 (two) times daily as needed for muscle spasms. 30 tablet Bing Neighbors, FNP     PDMP not reviewed this encounter.   Bing Neighbors, FNP 02/14/20 1424

## 2020-02-10 NOTE — Progress Notes (Signed)
I agree with the above plan 

## 2020-02-16 ENCOUNTER — Other Ambulatory Visit: Payer: Self-pay | Admitting: Family Medicine

## 2020-02-29 ENCOUNTER — Ambulatory Visit: Payer: Medicare Other | Admitting: Podiatry

## 2020-03-02 ENCOUNTER — Telehealth: Payer: Self-pay

## 2020-03-02 ENCOUNTER — Other Ambulatory Visit: Payer: Self-pay

## 2020-03-02 ENCOUNTER — Ambulatory Visit (HOSPITAL_COMMUNITY)
Admission: RE | Admit: 2020-03-02 | Discharge: 2020-03-02 | Disposition: A | Discharge status: Home / Self Care | Payer: Medicare Other | Source: Ambulatory Visit | Attending: Adult Health | Admitting: Adult Health

## 2020-03-02 DIAGNOSIS — I6523 Occlusion and stenosis of bilateral carotid arteries: Secondary | ICD-10-CM | POA: Diagnosis not present

## 2020-03-02 NOTE — Telephone Encounter (Signed)
-----   Message from Ihor Austin, NP sent at 03/02/2020  1:56 PM EDT ----- Please advise patient that recent carotid ultrasound showed minimal carotid stenosis bilaterally

## 2020-03-02 NOTE — Telephone Encounter (Signed)
Pt was with her son Yolanda Hopkins and they both were notified of the results.  They verbalized understanding

## 2020-03-02 NOTE — Progress Notes (Signed)
VASCULAR LAB    Carotid duplex has been performed.  See CV proc for preliminary results.   Caedyn Tassinari, RVT 03/02/2020, 1:24 PM

## 2020-03-06 ENCOUNTER — Ambulatory Visit (INDEPENDENT_AMBULATORY_CARE_PROVIDER_SITE_OTHER): Payer: Medicare Other | Admitting: Podiatry

## 2020-03-06 ENCOUNTER — Encounter: Payer: Self-pay | Admitting: Podiatry

## 2020-03-06 ENCOUNTER — Other Ambulatory Visit: Payer: Self-pay

## 2020-03-06 DIAGNOSIS — B351 Tinea unguium: Secondary | ICD-10-CM | POA: Diagnosis not present

## 2020-03-06 DIAGNOSIS — L84 Corns and callosities: Secondary | ICD-10-CM | POA: Diagnosis not present

## 2020-03-06 DIAGNOSIS — M2042 Other hammer toe(s) (acquired), left foot: Secondary | ICD-10-CM

## 2020-03-06 DIAGNOSIS — I739 Peripheral vascular disease, unspecified: Secondary | ICD-10-CM | POA: Diagnosis not present

## 2020-03-06 DIAGNOSIS — M2041 Other hammer toe(s) (acquired), right foot: Secondary | ICD-10-CM

## 2020-03-06 DIAGNOSIS — M79674 Pain in right toe(s): Secondary | ICD-10-CM | POA: Diagnosis not present

## 2020-03-06 DIAGNOSIS — M79675 Pain in left toe(s): Secondary | ICD-10-CM

## 2020-03-11 NOTE — Progress Notes (Signed)
Subjective:  Patient ID: Yolanda Hopkins, female    DOB: 1940/09/21,  MRN: [id number]  79 y.o. female presents with painful corn(s) b/l and callus(es) right foot and painful mycotic nails.  Pain interferes with ambulation. Aggravating factors include wearing enclosed shoe gear. 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 Patient is on long term blood thinner, Plavix.  Her son is present during today's visit. They voice no new pedal problems on today's visit.  Review of Systems: Negative except as noted in the HPI.  Past Medical History:  Diagnosis Date  . Dementia (HCC)   . Hypertension   . Stroke (cerebrum) [facility])    March 2021  . Tobacco abuse 08/19/2013  . UTI (urinary tract infection)    frequent  . Vertigo    Past Surgical History:  Procedure Laterality Date  . COLONOSCOPY N/A 05/23/2016   Procedure: COLONOSCOPY;  Surgeon: Jeani Hawking, MD;  Location: [facility] ENDOSCOPY;  Service: Endoscopy;  Laterality: N/A;  . ESOPHAGOGASTRODUODENOSCOPY N/A 05/23/2016   Procedure: ESOPHAGOGASTRODUODENOSCOPY (EGD);  Surgeon: Jeani Hawking, MD;  Location: [facility] ENDOSCOPY;  Service: Endoscopy;  Laterality: N/A;  . GIVENS CAPSULE STUDY N/A 05/23/2016   Procedure: GIVENS CAPSULE STUDY;  Surgeon: Jeani Hawking, MD;  Location: [facility] ENDOSCOPY;  Service: Endoscopy;  Laterality: N/A;   Patient Active Problem List   Diagnosis Date Noted  . Incomplete defecation   . Slow transit constipation   . History of hypertension   . Urinary frequency   . Urinary incontinence   . Acute CVA (cerebrovascular accident) (HCC) 07/30/2019  . Acute cystitis without hematuria 04/18/2019  . Closed displaced fracture of right femoral neck (HCC) 04/18/2019  . Dementia (HCC) 04/18/2019  . Fall 04/18/2019  . GI bleed 05/22/2016  . Pressure injury of skin 05/22/2016  . Loss of weight   . Dyslipidemia   . Hyperlipidemia 08/30/2013  . 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 HTN (hypertension) 08/19/2013  . Tobacco abuse  08/19/2013  . Vertigo 08/19/2013  . Gait abnormality 08/19/2013  . CVA (cerebral infarction) 08/19/2013    Current Outpatient Medications:  .  acetaminophen (TYLENOL) 325 MG tablet, Take 2 tablets (650 mg total) by mouth every 4 (four) hours as needed for mild pain (or temp > 37.5 C (99.5 F))., Disp:  , Rfl:  .  aspirin EC 325 MG EC tablet, Take 1 tablet (325 mg total) by mouth daily., Disp: 30 tablet, Rfl: 0 .  atorvastatin (LIPITOR) 40 MG tablet, Take 1 tablet (40 mg total) by mouth daily at 6 PM., Disp: 30 tablet, Rfl: 0 .  mirtazapine (REMERON) 15 MG tablet, Take 1 tablet (15 mg total) by mouth at bedtime., Disp: 30 tablet, Rfl: 0 .  nicotine (NICODERM CQ - DOSED IN MG/24 HOURS) 14 mg/24hr patch, 14 mg daily., Disp: , Rfl:  .  nicotine (NICODERM CQ - DOSED IN MG/24 HR) 7 mg/24hr patch, 7 mg patch daily for two weeks and stop, Disp: 28 patch, Rfl: 0 .  tiZANidine (ZANAFLEX) 2 MG tablet, Take 1 tablet (2 mg total) by mouth 2 (two) times daily as needed for muscle spasms., Disp: 30 tablet, Rfl: 0 Allergies  Allergen Reactions  . Mango Flavor Nausea Only and Other (See Comments)    Flushing and extreme lethargy (effect is long-lasting and familial)  . Poison Ivy Extract Rash  . Sulfa Antibiotics Rash   Social History   Occupational History  . Not on file  Tobacco Use  . Smoking status: Current Some  Day Smoker    Packs/day: 0.50    Years: 55.00    Pack years: 27.50    Types: Cigarettes  . Smokeless tobacco: Never Used  Substance and Sexual Activity  . Alcohol use: No  . Drug use: No  . Sexual activity: Not on file    Objective:   Constitutional Pt is a pleasant 79 y.o. Caucasian female WD, WN in NAD.  AAO x 3.   Vascular Capillary refill time to digits immediate b/l. Palpable PT pulse(s) b/l lower extremities Faintly palpable DP pulse(s) b/l lower extremities. Pedal hair absent. Lower extremity skin temperature gradient warm to cool. No pain with calf compression b/l. Trace  edema noted b/l lower extremities. No ischemia or gangrene noted b/l lower extremities. No cyanosis or clubbing noted.  Neurologic Normal speech.Oriented to person, place, and time. Protective sensation intact 5/5 intact bilaterally with 10g monofilament b/l. Babinski reflex negative b/l. Clonus negative b/l.  Dermatologic Pedal skin is thin shiny, atrophic b/l lower extremities. No open wounds bilaterally. No interdigital macerations bilaterally. Toenails 1-5 b/l elongated, discolored, dystrophic, thickened, crumbly with subungual debris and tenderness to dorsal palpation. Hyperkeratotic lesion(s) L 5th toe, R 5th toe and submet head 1 right foot.  No erythema, no edema, no drainage, no flocculence.  Orthopedic: Normal muscle strength 5/5 to all lower extremity muscle groups bilaterally. No pain crepitus or joint limitation noted with ROM b/l. Hammertoes noted to the L 5th toe and R 5th toe. Utilizes walker for ambulation assistance.   Radiographs: None Assessment:   1. Pain due to onychomycosis of toenails of both feet   2. Corns and callosities   3. Acquired hammertoes of both feet   4. PAD (peripheral artery disease) (HCC)    Plan:  Patient was evaluated and treated and all questions answered.  Onychomycosis with pain -Nails palliatively debridement as below. -Educated on self-care  Procedure: Nail Debridement Rationale: Pain Type of Debridement: manual, sharp debridement. Instrumentation: Nail nipper, rotary burr. Number of Nails: 10  -Examined patient. Advised pt to wear her shoes.  -Toenails 1-5 b/l were debrided in length and girth with sterile nail nippers and dremel without iatrogenic bleeding.  -Corn(s) L 5th toe and R 5th toe and callus(es) submet head 1 right foot were pared utilizing sterile scalpel blade without incident. Total number debrided =3. -Patient to report any pedal injuries to medical professional immediately. -Patient to continue soft, supportive shoe gear  daily. -Patient/POA to call should there be question/concern in the interim.  Return in about 3 months (around 06/06/2020) for toenail debridement w/corn(s)/callus(es).  Freddie Breech, DPM

## 2020-05-09 ENCOUNTER — Ambulatory Visit: Payer: Medicare Other | Admitting: Adult Health

## 2020-06-12 ENCOUNTER — Ambulatory Visit (INDEPENDENT_AMBULATORY_CARE_PROVIDER_SITE_OTHER): Payer: Medicare Other | Admitting: Podiatry

## 2020-06-12 ENCOUNTER — Other Ambulatory Visit: Payer: Self-pay

## 2020-06-12 ENCOUNTER — Encounter: Payer: Self-pay | Admitting: Podiatry

## 2020-06-12 DIAGNOSIS — M79675 Pain in left toe(s): Secondary | ICD-10-CM | POA: Diagnosis not present

## 2020-06-12 DIAGNOSIS — B351 Tinea unguium: Secondary | ICD-10-CM

## 2020-06-12 DIAGNOSIS — I739 Peripheral vascular disease, unspecified: Secondary | ICD-10-CM

## 2020-06-12 DIAGNOSIS — L84 Corns and callosities: Secondary | ICD-10-CM

## 2020-06-12 DIAGNOSIS — M2041 Other hammer toe(s) (acquired), right foot: Secondary | ICD-10-CM

## 2020-06-12 DIAGNOSIS — M79674 Pain in right toe(s): Secondary | ICD-10-CM

## 2020-06-12 DIAGNOSIS — M2042 Other hammer toe(s) (acquired), left foot: Secondary | ICD-10-CM

## 2020-06-12 NOTE — Progress Notes (Signed)
Subjective:  Patient ID: Yolanda Hopkins, female    DOB: Nov 06, 1940,  MRN: [id number]  80 y.o. female presents with h/o clotting disorder, painful corn(s) b/l and callus(es) right foot and painful mycotic nails.  Pain interferes with ambulation. Aggravating factors include wearing enclosed shoe gear. 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 Patient is on long term blood thinner, Plavix.  Her son is present during today's visit. They voice no new pedal problems on today's visit.  Review of Systems: Negative except as noted in the HPI.  Past Medical History:  Diagnosis Date  . Dementia (HCC)   . Hypertension   . Stroke (cerebrum) [facility])    March 2021  . Tobacco abuse 08/19/2013  . UTI (urinary tract infection)    frequent  . Vertigo    Past Surgical History:  Procedure Laterality Date  . COLONOSCOPY N/A 05/23/2016   Procedure: COLONOSCOPY;  Surgeon: Jeani Hawking, MD;  Location: [facility] ENDOSCOPY;  Service: Endoscopy;  Laterality: N/A;  . ESOPHAGOGASTRODUODENOSCOPY N/A 05/23/2016   Procedure: ESOPHAGOGASTRODUODENOSCOPY (EGD);  Surgeon: Jeani Hawking, MD;  Location: [facility] ENDOSCOPY;  Service: Endoscopy;  Laterality: N/A;  . GIVENS CAPSULE STUDY N/A 05/23/2016   Procedure: GIVENS CAPSULE STUDY;  Surgeon: Jeani Hawking, MD;  Location: [facility] ENDOSCOPY;  Service: Endoscopy;  Laterality: N/A;   Patient Active Problem List   Diagnosis Date Noted  . Incomplete defecation   . Slow transit constipation   . History of hypertension   . Urinary frequency   . Urinary incontinence   . Acute CVA (cerebrovascular accident) (HCC) 07/30/2019  . Acute cystitis without hematuria 04/18/2019  . Closed displaced fracture of right femoral neck (HCC) 04/18/2019  . Dementia (HCC) 04/18/2019  . Fall 04/18/2019  . GI bleed 05/22/2016  . Pressure injury of skin 05/22/2016  . Loss of weight   . Dyslipidemia   . Hyperlipidemia 08/30/2013  . 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 HTN (hypertension)  08/19/2013  . Tobacco abuse 08/19/2013  . Vertigo 08/19/2013  . Gait abnormality 08/19/2013  . CVA (cerebral infarction) 08/19/2013    Current Outpatient Medications:  .  acetaminophen (TYLENOL) 325 MG tablet, Take 2 tablets (650 mg total) by mouth every 4 (four) hours as needed for mild pain (or temp > 37.5 C (99.5 F))., Disp:  , Rfl:  .  aspirin EC 325 MG EC tablet, Take 1 tablet (325 mg total) by mouth daily., Disp: 30 tablet, Rfl: 0 .  atorvastatin (LIPITOR) 40 MG tablet, Take 1 tablet (40 mg total) by mouth daily at 6 PM., Disp: 30 tablet, Rfl: 0 .  clopidogrel (PLAVIX) 75 MG tablet, 1 tablet, Disp: , Rfl:  .  mirtazapine (REMERON) 15 MG tablet, Take 1 tablet (15 mg total) by mouth at bedtime., Disp: 30 tablet, Rfl: 0 .  mirtazapine (REMERON) 30 MG tablet, Take 30 mg by mouth at bedtime., Disp: , Rfl:  .  nicotine (NICODERM CQ - DOSED IN MG/24 HOURS) 14 mg/24hr patch, 14 mg daily., Disp: , Rfl:  .  nicotine (NICODERM CQ - DOSED IN MG/24 HR) 7 mg/24hr patch, 7 mg patch daily for two weeks and stop, Disp: 28 patch, Rfl: 0 .  tiZANidine (ZANAFLEX) 2 MG tablet, Take 1 tablet (2 mg total) by mouth 2 (two) times daily as needed for muscle spasms., Disp: 30 tablet, Rfl: 0 Allergies  Allergen Reactions  . Mango Flavor Nausea Only and Other (See Comments)    Flushing and extreme lethargy (effect is long-lasting and  familial)  . Poison Ivy Extract Rash  . Sulfa Antibiotics Rash   Social History   Occupational History  . Not on file  Tobacco Use  . Smoking status: Current Some Day Smoker    Packs/day: 0.50    Years: 55.00    Pack years: 27.50    Types: Cigarettes  . Smokeless tobacco: Never Used  Substance and Sexual Activity  . Alcohol use: No  . Drug use: No  . Sexual activity: Not on file    Objective:   Constitutional Pt is a pleasant 80 y.o. Caucasian female WD, WN in NAD.  AAO x 3.   Vascular Capillary refill time to digits immediate b/l. Palpable PT pulse(s) b/l lower  extremities Faintly palpable DP pulse(s) b/l lower extremities. Pedal hair absent. Lower extremity skin temperature gradient warm to cool. No pain with calf compression b/l. Trace edema noted b/l lower extremities. No ischemia or gangrene noted b/l lower extremities. No cyanosis or clubbing noted.  Neurologic Normal speech.Oriented to person, place, and time. Protective sensation intact 5/5 intact bilaterally with 10g monofilament b/l. Babinski reflex negative b/l. Clonus negative b/l.  Dermatologic Pedal skin is thin shiny, atrophic b/l lower extremities. No open wounds bilaterally. No interdigital macerations bilaterally. Toenails 1-5 b/l elongated, discolored, dystrophic, thickened, crumbly with subungual debris and tenderness to dorsal palpation. Hyperkeratotic lesion(s) L 5th toe, R 5th toe and submet head 1 right foot.  No erythema, no edema, no drainage, no flocculence.  Orthopedic: Normal muscle strength 5/5 to all lower extremity muscle groups bilaterally. No pain crepitus or joint limitation noted with ROM b/l. Hammertoes noted to the L 5th toe and R 5th toe. Utilizes walker for ambulation assistance.   Radiographs: None Assessment:   1. Pain due to onychomycosis of toenails of both feet   2. Corns and callosities   3. Acquired hammertoes of both feet   4. PAD (peripheral artery disease) (HCC)    Plan:  Patient was evaluated and treated and all questions answered.  Onychomycosis with pain -Nails palliatively debridement as below. -Educated on self-care  Procedure: Nail Debridement Rationale: Pain Type of Debridement: manual, sharp debridement. Instrumentation: Nail nipper, rotary burr. Number of Nails: 10 -Examined patient. Advised pt to wear her shoes.  -Toenails 1-5 b/l were debrided in length and girth with sterile nail nippers and dremel. Small iatrogenic laceration of right great toe addressed with Lumicain Hemostatic Solution. Digit cleansed and triple antibiotic ointment  applied. Son is to apply Neosporin to right great toe once daily for one week. He related understanding. -Corn(s) L 5th toe and R 5th toe and callus(es) submet head 1 right foot were pared utilizing sterile scalpel blade without incident. Total number debrided =3. -Patient to report any pedal injuries to medical professional immediately. -Patient to continue soft, supportive shoe gear daily. -Patient/POA to call should there be question/concern in the interim.  Return in about 3 months (around 09/09/2020).  Freddie Breech, DPM

## 2020-07-04 NOTE — Telephone Encounter (Signed)
error 

## 2020-08-08 ENCOUNTER — Ambulatory Visit: Payer: Medicare Other | Admitting: Adult Health

## 2020-09-20 ENCOUNTER — Other Ambulatory Visit: Payer: Self-pay

## 2020-09-20 ENCOUNTER — Ambulatory Visit (INDEPENDENT_AMBULATORY_CARE_PROVIDER_SITE_OTHER): Payer: Medicare Other | Admitting: Podiatry

## 2020-09-20 DIAGNOSIS — M79674 Pain in right toe(s): Secondary | ICD-10-CM

## 2020-09-20 DIAGNOSIS — B351 Tinea unguium: Secondary | ICD-10-CM | POA: Diagnosis not present

## 2020-09-20 DIAGNOSIS — L84 Corns and callosities: Secondary | ICD-10-CM | POA: Diagnosis not present

## 2020-09-20 DIAGNOSIS — M2041 Other hammer toe(s) (acquired), right foot: Secondary | ICD-10-CM

## 2020-09-20 DIAGNOSIS — I739 Peripheral vascular disease, unspecified: Secondary | ICD-10-CM

## 2020-09-20 DIAGNOSIS — M2042 Other hammer toe(s) (acquired), left foot: Secondary | ICD-10-CM

## 2020-09-20 DIAGNOSIS — M79675 Pain in left toe(s): Secondary | ICD-10-CM

## 2020-09-25 NOTE — Progress Notes (Signed)
[facility] [address] [place] [address] [phone]       STROKE FOLLOW UP NOTE  Yolanda Hopkins Date of Birth:  09-03-1940 Medical Record Number:  [id number]   Reason for Referral: stroke follow up    SUBJECTIVE:   CHIEF COMPLAINT:  Chief Complaint  Patient presents with  . Follow-up    Rm 14 with son (chris) Pt is well and stable       HPI:   Today, 09/26/2020, Yolanda Hopkins returns for stroke follow-up accompanied by her son after prior visit approximately 8 months ago.  Doing well from stroke standpoint without new or reoccurring stroke/TIA symptoms. He does report intermittent right leg weakness but also has hx of right hip replacement with occasional pain.  She typically will ambulate with rolling walker but will use w/c for long distance and when she is not stable.  Denies any recent falls.  Dementia stable with continued use of mirtazapine for behaviors managed by PCP which son reports has been greatly helping.  Compliant on aspirin and atorvastatin without associated side effects.  Blood pressure today 156/83.  Does not routinely monitor at home.  Continued tobacco use but able to decrease amount with use of nicotine patches.  No further concerns at this time.   History provided for reference purposes only  Update 02/09/2020 JM: [name] returns for stroke follow-up accompanied by her son.  Stable since prior visit - denies residual deficits. Denies new or worsening stroke/TIA symptoms.  Ambulates with rolling walker short distance and wheelchair for long distance.  Remains on mirtazapine per PCP for dementia related behaviors. Per son, cognition has improved since prior visit.  Continues on aspirin 325 mg daily without bleeding or bruising.  Continues on atorvastatin 40 mg daily without myalgias.  Blood pressure today 122/69.  No concerns at this time.  Update 09/22/2019 JM: Yolanda Hopkins is being seen for hospital follow-up accompanied by her  son.  She has been stable since discharge with residual stroke deficits of mild right sided weakness. She continues to work with [facility] PT with ongoing improvement. Ongoing use of rolling walker.  Cognition has been stable and denies worsening.  Initial concerns of dementia related behaviors but still endorses great improvement and continues on mirtazapine currently managed by PCP.  Denies new or worsening stroke/TIA symptoms. Continues DAPT with aspirin 325 mg daily and clopidogrel 75 mg daily without bleeding or bruising.  Continues on atorvastatin 40 mg daily without myalgias. Lipid panel checked recently by PCP and son reports satisfactory levels.  Blood pressure today 122/84.  No concerns at this time.  Stroke admission 07/30/2019 Yolanda Hopkins is a 80 y.o. female with history of hypertension, dementia, previous stroke, vertigo and tobacco use,  who presented on July 30, 2019 with right sided weakness and 2 falls.   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  Initiated DAPT for 3 months then aspirin alone due to severe left ACA stenosis.  Prior stroke history 08/2013 left BG infarct.  History of HTN stable.  History of HLD with LDL 158 and changed statin from Crestor to atorvastatin 40 mg daily.  Other stroke risk factors include underlying dementia, current tobacco use and advanced age.  Residual deficits of right hemiparesis and evaluated by therapies who recommended discharge to CIR for ongoing therapy needs.  She was eventually discharged home with recommendation of home health therapies on August 20, 2019.  Strokes:  Left ACA scattered infarcts, likely due to large vessel atherosclerosis.  Resultant abulia, flat affect, right hemiparesis  CT head - No CT evidence for acute intracranial abnormality.   MRI head - Multiple small  acute infarcts within the left frontal white matter, in the left anterior cerebral artery territory. 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  2D Echo - EF 55 to 60%. No cardiac source of emboli identified.   [employer] Virus 2 - not ordered  LDL - 158 -initiated atorvastatin 40 mg daily (d/c home crestor)  HgbA1c - 5.3  VTE prophylaxis - Lovenox  No antithrombotic prior to admission, now on aspirin 325 mg daily and clopidogrel 75 mg daily DAPT for 3 months and then aspirin alone due to severe left ACA stenosis.  Patient counseled to be compliant with her antithrombotic medications  Ongoing aggressive stroke risk factor management  Therapy recommendations:  CIR  Disposition:  CIR      ROS:   N/A d/t cognitive impairment  PMH:  Past Medical History:  Diagnosis Date  . Dementia (HCC)   . Hypertension   . Stroke (cerebrum) [facility])    March 2021  . Tobacco abuse 08/19/2013  . UTI (urinary tract infection)    frequent  . Vertigo     PSH:  Past Surgical History:  Procedure Laterality Date  . COLONOSCOPY N/A 05/23/2016   Procedure: COLONOSCOPY;  Surgeon: Jeani Hawking, MD;  Location: [facility] ENDOSCOPY;  Service: Endoscopy;  Laterality: N/A;  . ESOPHAGOGASTRODUODENOSCOPY N/A 05/23/2016   Procedure: ESOPHAGOGASTRODUODENOSCOPY (EGD);  Surgeon: Jeani Hawking, MD;  Location: [facility] ENDOSCOPY;  Service: Endoscopy;  Laterality: N/A;  . GIVENS CAPSULE STUDY N/A 05/23/2016   Procedure: GIVENS CAPSULE STUDY;  Surgeon: Jeani Hawking, MD;  Location: [facility] ENDOSCOPY;  Service: Endoscopy;  Laterality: N/A;    Social History:  Social History   Socioeconomic History  . Marital status: Married    Spouse name: Not on file  . Number of children: Not on file  . Years of  education: Not on file  . Highest education level: Not on file  Occupational History  . Not on file  Tobacco Use  . Smoking status: Current Some Day Smoker    Packs/day: 0.50    Years: 55.00    Pack years: 27.50    Types: Cigarettes  . Smokeless tobacco: Never Used  Substance and Sexual Activity  . Alcohol use: No  . Drug use: No  . Sexual activity: Not on file  Other Topics Concern  . Not on file  Social History Narrative  . Not on file   Social Determinants of Health   Financial Resource Strain: Not on file  Food Insecurity: Not on file  Transportation Needs: Not on file  Physical Activity: Not on file  Stress: Not on file  Social Connections: Not on file  Intimate Partner Violence: Not on file    Family History:  Family History  Problem Relation Age of Onset  . Hypertension Mother   . Hypertension Father   . Liver cancer Father     Medications:   Current Outpatient Medications on File Prior to Visit  Medication Sig Dispense Refill  .  acetaminophen (TYLENOL) 500 MG tablet 1 tablet as needed    . aspirin 325 MG tablet 1 tablet    . atorvastatin (LIPITOR) 40 MG tablet Take 1 tablet by mouth daily.    . clopidogrel (PLAVIX) 75 MG tablet 1 tablet    . clotrimazole (MYCELEX) 10 MG troche SMARTSIG:1 Lozenge(s) By Mouth 5 Times Daily    . fluconazole (DIFLUCAN) 100 MG tablet 1 tablet    . metroNIDAZOLE (FLAGYL) 250 MG tablet Take by mouth.    . mirtazapine (REMERON) 15 MG tablet Take 1 tablet (15 mg total) by mouth at bedtime. 30 tablet 0  . mirtazapine (REMERON) 30 MG tablet Take 30 mg by mouth at bedtime.    . nicotine (NICODERM CQ - DOSED IN MG/24 HOURS) 14 mg/24hr patch 14 mg daily.    . nicotine (NICODERM CQ - DOSED IN MG/24 HR) 7 mg/24hr patch 7 mg patch daily for two weeks and stop 28 patch 0  . ondansetron (ZOFRAN-ODT) 4 MG disintegrating tablet 1 tablet on the tongue and allow to dissolve  as needed    . tiZANidine (ZANAFLEX) 2 MG tablet Take 1 tablet (2 mg  total) by mouth 2 (two) times daily as needed for muscle spasms. 30 tablet 0   No current facility-administered medications on file prior to visit.    Allergies:   Allergies  Allergen Reactions  . Mango Flavor Nausea Only and Other (See Comments)    Flushing and extreme lethargy (effect is long-lasting and familial)  . Poison Ivy Extract Rash  . Sulfa Antibiotics Rash      OBJECTIVE:  Physical Exam  Vitals:   09/26/20 0751  BP: (!) 156/83  Pulse: 82   There is no height or weight on file to calculate BMI. No exam data present   General: Frail pleasant elderly Caucasian female, seated, in no evident distress Head: head normocephalic and atraumatic.   Neck: supple with no carotid or supraclavicular bruits Cardiovascular: regular rate and rhythm, no murmurs Musculoskeletal: no deformity Skin:  no rash/petichiae Vascular:  Normal pulses all extremities   Neurologic Exam Mental Status: Awake and fully alert. Fluent speech and language.  Oriented to time but unable to state office name but aware she is being seen for stroke follow-up. Recent memory impaired and remote memory intact. Attention span, concentration and fund of knowledge diminished with son providing majority of history. Mood and affect appropriate.  Cranial Nerves: Pupils equal, briskly reactive to light. Extraocular movements full without nystagmus. Visual fields full to confrontation. Hearing intact. Facial sensation intact. Mild left lower facial droop - chronic from prior stroke Motor: Normal bulk and tone.  Full strength in all tested extremities Sensory.: intact to touch , pinprick , position and vibratory sensation.  Coordination: Rapid alternating movements normal in all extremities. Finger-to-nose and heel-to-shin performed accurately bilaterally. Gait and Station: deferred as patient in w/c  Reflexes: 1+ and symmetric. Toes downgoing.       ASSESSMENT/PLAN: Yolanda Hopkins is a 80 y.o. year old  female presented with right-sided weakness resulting in 2 falls on July 30, 2019 with stroke work-up revealing left ACA scattered infarcts secondary to large vessel arthrosclerosis with azygous ACAs, left A2 proximal high-grade stenosis and left A3 tandem stenosis. Vascular risk factors include HTN, HLD, prior stroke, dementia, tobacco use and age.     1. Left ACA stroke:  a. Recovered well without residual deficits. Son reports intermittent RLE weakness likely more in setting of hx of right hip replacement  and intermittent pain.  Unable to appreciate weakness on today's exam.  Discussed monitoring for new stroke/TIA symptoms and to call 911 immediately with any concern b. Continue aspirin 325 mg daily  and atorvastatin 40 mg daily for secondary stroke prevention. c. Discussed secondary stroke prevention measures and importance of close follow-up with PCP for aggressive stroke risk factor management 2. HTN: BP<130/90. Slightly elevated today but typically stable per son.  On nonpharmacological management per PCP 3. HLD: LDL goal<70.  Recent lipid panel (05/2020) LDL 54 on atorvastatin 40 mg daily managed by PCP 4. Left ICA stenosis: Carotid duplex 02/2020 bilateral ICA 1 to 39% stenosis. Continue medical management 5. Tobacco use: Continued use with decreasing amt with use of nicotine patches.  Discussed importance of complete tobacco cessation and importance with continued strategies for decreasing amount until complete cessation. 6. Dementia: Stable per son.  On mirtazapine for behaviors per PCP.  managed by PCP.    Overall stable from stroke standpoint and recommend follow-up on an as-needed basis  CC:  [facility] provider: Dr. Odetta Pink, [place], NP      Ihor Austin, [facility]  [facility] [address] [place], [place] [address]  Phone [phone] Fax [phone] Note: This document was prepared with digital dictation and possible smart phrase  technology. Any transcriptional errors that result from this process are unintentional.

## 2020-09-26 ENCOUNTER — Encounter: Payer: Self-pay | Admitting: Podiatry

## 2020-09-26 ENCOUNTER — Other Ambulatory Visit: Payer: Self-pay

## 2020-09-26 ENCOUNTER — Ambulatory Visit (INDEPENDENT_AMBULATORY_CARE_PROVIDER_SITE_OTHER): Payer: Medicare Other | Admitting: Adult Health

## 2020-09-26 ENCOUNTER — Encounter: Payer: Self-pay | Admitting: Adult Health

## 2020-09-26 VITALS — BP 156/83 | HR 82

## 2020-09-26 DIAGNOSIS — I6523 Occlusion and stenosis of bilateral carotid arteries: Secondary | ICD-10-CM | POA: Diagnosis not present

## 2020-09-26 DIAGNOSIS — E782 Mixed hyperlipidemia: Secondary | ICD-10-CM | POA: Diagnosis not present

## 2020-09-26 DIAGNOSIS — Z720 Tobacco use: Secondary | ICD-10-CM

## 2020-09-26 DIAGNOSIS — F0151 Vascular dementia with behavioral disturbance: Secondary | ICD-10-CM

## 2020-09-26 DIAGNOSIS — I63522 Cerebral infarction due to unspecified occlusion or stenosis of left anterior cerebral artery: Secondary | ICD-10-CM

## 2020-09-26 DIAGNOSIS — G4700 Insomnia, unspecified: Secondary | ICD-10-CM | POA: Insufficient documentation

## 2020-09-26 DIAGNOSIS — I10 Essential (primary) hypertension: Secondary | ICD-10-CM

## 2020-09-26 DIAGNOSIS — F01518 Vascular dementia, unspecified severity, with other behavioral disturbance: Secondary | ICD-10-CM

## 2020-09-26 NOTE — Progress Notes (Signed)
  Subjective:  Patient ID: Yolanda Hopkins, female    DOB: 24-May-1940,  MRN: [id number]  80 y.o. female presents at risk foot care with h/o clotting disorder and painful thick toenails that are difficult to trim. Pain interferes with ambulation. Aggravating factors include wearing enclosed shoe gear. Pain is relieved with periodic professional debridement.   Patient has h/o dementia. She is accompanied by her son on today's visit.  PCP is Gilman Schmidt, NP, and last visit was 2 weeks ago.  They voice no new pedal problems on today's visit.  Allergies  Allergen Reactions  . Mango Flavor Nausea Only and Other (See Comments)    Flushing and extreme lethargy (effect is long-lasting and familial)  . Poison Ivy Extract Rash  . Sulfa Antibiotics Rash    Review of Systems: Negative except as noted in the HPI.   Objective:   Constitutional Pt is a pleasant 80 y.o. Caucasian female WD, WN in NAD. AAO x 3.   Vascular Capillary refill time to digits immediate b/l. Palpable PT pulse(s) b/l lower extremities Faintly palpable DP pulse(s) b/l lower extremities. Pedal hair absent. Lower extremity skin temperature gradient warm to cool. Trace edema noted b/l lower extremities.  Neurologic Protective sensation intact 5/5 intact bilaterally with 10g monofilament b/l. Vibratory sensation intact b/l.  Dermatologic Pedal skin with normal turgor, texture and tone bilaterally. No open wounds bilaterally. No interdigital macerations bilaterally. Toenails 1-5 b/l elongated, discolored, dystrophic, thickened, crumbly with subungual debris and tenderness to dorsal palpation. Hyperkeratotic lesion(s) L 5th toe, R 5th toe and submet head 1 right foot.  No erythema, no edema, no drainage, no fluctuance.  Orthopedic: Normal muscle strength 5/5 to all lower extremity muscle groups bilaterally. No pain crepitus or joint limitation noted with ROM b/l. Hammertoes noted to the L 5th toe and R 5th toe.   Radiographs:  None Assessment:   1. Pain due to onychomycosis of toenails of both feet   2. Corns and callosities   3. Acquired hammertoes of both feet   4. PAD (peripheral artery disease) (HCC)    Plan:  Patient was evaluated and treated and all questions answered.  Onychomycosis with pain -Nails palliatively debridement as below. -Educated on self-care  Procedure: Nail Debridement Rationale: Pain Type of Debridement: manual, sharp debridement. Instrumentation: Nail nipper, rotary burr. Number of Nails: 10  -Examined patient. -Patient to continue soft, supportive shoe gear daily. -Toenails 1-5 b/l were debrided in length and girth with sterile nail nippers and dremel without iatrogenic bleeding.  -Corn(s) L 5th toe and R 5th toe and callus(es) submet head 1 right foot were pared utilizing sterile scalpel blade without incident. Total number debrided =3. -Patient to report any pedal injuries to medical professional immediately. -Patient/POA to call should there be question/concern in the interim.  Return in about 3 months (around 12/21/2020).  Freddie Breech, DPM

## 2020-09-26 NOTE — Patient Instructions (Signed)
Continue aspirin 81 mg daily  and atorvastatin for secondary stroke prevention  Continue to follow up with PCP regarding cholesterol and blood pressure management  Maintain strict control of hypertension with blood pressure goal below 130/90 and cholesterol with LDL cholesterol (bad cholesterol) goal below 70 mg/dL.        Thank you for coming to see us at [facility]. I hope we have been able to provide you high quality care today.  You may receive a patient satisfaction survey over the next few weeks. We would appreciate your feedback and comments so that we may continue to improve ourselves and the health of our patients.    

## 2020-09-26 NOTE — Progress Notes (Signed)
I agree with the above plan 

## 2020-12-27 ENCOUNTER — Ambulatory Visit: Payer: Medicare Other | Admitting: Podiatry

## 2021-02-20 ENCOUNTER — Encounter: Payer: Self-pay | Admitting: Podiatry

## 2021-02-20 ENCOUNTER — Other Ambulatory Visit: Payer: Self-pay

## 2021-02-20 ENCOUNTER — Ambulatory Visit (INDEPENDENT_AMBULATORY_CARE_PROVIDER_SITE_OTHER): Payer: Medicare Other | Admitting: Podiatry

## 2021-02-20 DIAGNOSIS — I739 Peripheral vascular disease, unspecified: Secondary | ICD-10-CM

## 2021-02-20 DIAGNOSIS — M79674 Pain in right toe(s): Secondary | ICD-10-CM | POA: Diagnosis not present

## 2021-02-20 DIAGNOSIS — B351 Tinea unguium: Secondary | ICD-10-CM

## 2021-02-20 DIAGNOSIS — M79675 Pain in left toe(s): Secondary | ICD-10-CM | POA: Diagnosis not present

## 2021-02-20 DIAGNOSIS — L84 Corns and callosities: Secondary | ICD-10-CM | POA: Diagnosis not present

## 2021-02-24 NOTE — Progress Notes (Signed)
  Subjective:  Patient ID: Yolanda Hopkins, female    DOB: 03-17-1941,  MRN: [id number]  80 y.o. female presents at risk foot care with h/o clotting disorder and painful thick toenails that are difficult to trim. Pain interferes with ambulation. Aggravating factors include wearing enclosed shoe gear. Pain is relieved with periodic professional debridement.   Patient has h/o dementia. She is accompanied by her son, Thayer Ohm,  on today's visit.  PCP is Gilman Schmidt, NP. Last visit was 10/25/2020.  They voice no new pedal problems on today's visit.  Allergies  Allergen Reactions   Mango Flavor Nausea Only and Other (See Comments)    Flushing and extreme lethargy (effect is long-lasting and familial)   Poison Ivy Extract Rash   Sulfa Antibiotics Rash    Review of Systems: Negative except as noted in the HPI.   Objective:   Constitutional Pt is a pleasant 80 y.o. Caucasian female WD, WN in NAD. AAO x 3.   Vascular Capillary fill time to digits <3 seconds b/l lower extremities. Faintly palpable PT pulse(s) b/l lower extremities. Nonpalpable DP pulse(s) b/l lower extremities. Pedal hair absent. Lower extremity skin temperature gradient warm to cool. Trace edema noted b/l lower extremities.  Neurologic Protective sensation intact 5/5 intact bilaterally with 10g monofilament b/l. Vibratory sensation intact b/l.  Dermatologic Pedal skin thin, shiny and atrophic b/l LE. No open wounds b/l lower extremities. No interdigital macerations b/l lower extremities. Toenails 1-5 b/l elongated, discolored, dystrophic, thickened, crumbly with subungual debris and tenderness to dorsal palpation. Hyperkeratotic lesion(s) L 5th toe, R 5th toe, and submet head 1 right foot.  No erythema, no edema, no drainage, no fluctuance.  Orthopedic: Normal muscle strength 5/5 to all lower extremity muscle groups bilaterally. No pain crepitus or joint limitation noted with ROM b/l. Hammertoes noted to the L 5th toe and R 5th  toe.   Radiographs: None Assessment:   1. Pain due to onychomycosis of toenails of both feet   2. Corns and callosities   3. PAD (peripheral artery disease) (HCC)     Plan:  -Examined patient. -Patient to continue soft, supportive shoe gear daily. -Toenails 1-5 b/l were debrided in length and girth with sterile nail nippers and dremel without iatrogenic bleeding.  -Corn(s) L 5th toe and R 5th toe and callus(es) submet head 1 right foot were pared utilizing sterile scalpel blade without incident. Total number debrided =3. -Patient to report any pedal injuries to medical professional immediately. -Patient/POA to call should there be question/concern in the interim.  Return in about 3 months (around 05/23/2021).  Freddie Breech, DPM

## 2021-05-17 ENCOUNTER — Encounter (HOSPITAL_COMMUNITY): Payer: Self-pay | Admitting: Internal Medicine

## 2021-05-17 ENCOUNTER — Emergency Department (HOSPITAL_COMMUNITY): Payer: Medicare Other

## 2021-05-17 ENCOUNTER — Inpatient Hospital Stay (HOSPITAL_COMMUNITY)
Admission: EM | Admit: 2021-05-17 | Discharge: 2021-06-13 | DRG: 951 | Disposition: E | Payer: Medicare Other | Attending: Family Medicine | Admitting: Family Medicine

## 2021-05-17 DIAGNOSIS — E785 Hyperlipidemia, unspecified: Secondary | ICD-10-CM | POA: Diagnosis present

## 2021-05-17 DIAGNOSIS — I10 Essential (primary) hypertension: Secondary | ICD-10-CM | POA: Diagnosis present

## 2021-05-17 DIAGNOSIS — Z9102 Food additives allergy status: Secondary | ICD-10-CM

## 2021-05-17 DIAGNOSIS — I161 Hypertensive emergency: Secondary | ICD-10-CM | POA: Diagnosis present

## 2021-05-17 DIAGNOSIS — I609 Nontraumatic subarachnoid hemorrhage, unspecified: Secondary | ICD-10-CM | POA: Diagnosis present

## 2021-05-17 DIAGNOSIS — Z79899 Other long term (current) drug therapy: Secondary | ICD-10-CM

## 2021-05-17 DIAGNOSIS — Z7982 Long term (current) use of aspirin: Secondary | ICD-10-CM

## 2021-05-17 DIAGNOSIS — I739 Peripheral vascular disease, unspecified: Secondary | ICD-10-CM | POA: Diagnosis present

## 2021-05-17 DIAGNOSIS — I619 Nontraumatic intracerebral hemorrhage, unspecified: Principal | ICD-10-CM | POA: Diagnosis present

## 2021-05-17 DIAGNOSIS — L89312 Pressure ulcer of right buttock, stage 2: Secondary | ICD-10-CM | POA: Diagnosis present

## 2021-05-17 DIAGNOSIS — I629 Nontraumatic intracranial hemorrhage, unspecified: Secondary | ICD-10-CM | POA: Diagnosis not present

## 2021-05-17 DIAGNOSIS — G935 Compression of brain: Secondary | ICD-10-CM | POA: Diagnosis present

## 2021-05-17 DIAGNOSIS — I639 Cerebral infarction, unspecified: Secondary | ICD-10-CM | POA: Diagnosis not present

## 2021-05-17 DIAGNOSIS — R0681 Apnea, not elsewhere classified: Secondary | ICD-10-CM | POA: Diagnosis not present

## 2021-05-17 DIAGNOSIS — F17210 Nicotine dependence, cigarettes, uncomplicated: Secondary | ICD-10-CM | POA: Diagnosis present

## 2021-05-17 DIAGNOSIS — R4701 Aphasia: Secondary | ICD-10-CM | POA: Diagnosis present

## 2021-05-17 DIAGNOSIS — R29724 NIHSS score 24: Secondary | ICD-10-CM | POA: Diagnosis present

## 2021-05-17 DIAGNOSIS — Z8249 Family history of ischemic heart disease and other diseases of the circulatory system: Secondary | ICD-10-CM | POA: Diagnosis not present

## 2021-05-17 DIAGNOSIS — Z515 Encounter for palliative care: Secondary | ICD-10-CM | POA: Diagnosis not present

## 2021-05-17 DIAGNOSIS — Z91048 Other nonmedicinal substance allergy status: Secondary | ICD-10-CM | POA: Diagnosis not present

## 2021-05-17 DIAGNOSIS — I69251 Hemiplegia and hemiparesis following other nontraumatic intracranial hemorrhage affecting right dominant side: Secondary | ICD-10-CM

## 2021-05-17 DIAGNOSIS — Z66 Do not resuscitate: Secondary | ICD-10-CM | POA: Diagnosis present

## 2021-05-17 DIAGNOSIS — F0390 Unspecified dementia without behavioral disturbance: Secondary | ICD-10-CM | POA: Diagnosis present

## 2021-05-17 DIAGNOSIS — Z882 Allergy status to sulfonamides status: Secondary | ICD-10-CM

## 2021-05-17 DIAGNOSIS — Z20822 Contact with and (suspected) exposure to covid-19: Secondary | ICD-10-CM | POA: Diagnosis present

## 2021-05-17 DIAGNOSIS — G936 Cerebral edema: Secondary | ICD-10-CM | POA: Diagnosis present

## 2021-05-17 LAB — COMPREHENSIVE METABOLIC PANEL
ALT: 23 U/L (ref 0–44)
AST: 24 U/L (ref 15–41)
Albumin: 3.8 g/dL (ref 3.5–5.0)
Alkaline Phosphatase: 100 U/L (ref 38–126)
Anion gap: 8 (ref 5–15)
BUN: 23 mg/dL (ref 8–23)
CO2: 24 mmol/L (ref 22–32)
Calcium: 9.3 mg/dL (ref 8.9–10.3)
Chloride: 108 mmol/L (ref 98–111)
Creatinine, Ser: 0.81 mg/dL (ref 0.44–1.00)
GFR, Estimated: >60 mL/min (ref >60)
Glucose, Bld: 125 mg/dL — ABNORMAL HIGH (ref 70–99)
Potassium: 3.6 mmol/L (ref 3.5–5.1)
Sodium: 140 mmol/L (ref 135–145)
Total Bilirubin: 0.5 mg/dL (ref 0.3–1.2)
Total Protein: 7.4 g/dL (ref 6.5–8.1)

## 2021-05-17 LAB — DIFFERENTIAL
Abs Immature Granulocytes: 0.05 10*3/uL (ref 0.00–0.07)
Basophils Absolute: 0.1 10*3/uL (ref 0.0–0.1)
Basophils Relative: 1 %
Eosinophils Absolute: 0.2 10*3/uL (ref 0.0–0.5)
Eosinophils Relative: 2 %
Immature Granulocytes: 1 %
Lymphocytes Relative: 13 %
Lymphs Abs: 1 10*3/uL (ref 0.7–4.0)
Monocytes Absolute: 0.6 10*3/uL (ref 0.1–1.0)
Monocytes Relative: 8 %
Neutro Abs: 6 10*3/uL (ref 1.7–7.7)
Neutrophils Relative %: 75 %

## 2021-05-17 LAB — CBC
HCT: 43.1 % (ref 36.0–46.0)
Hemoglobin: 13.9 g/dL (ref 12.0–15.0)
MCH: 31.3 pg (ref 26.0–34.0)
MCHC: 32.3 g/dL (ref 30.0–36.0)
MCV: 97.1 fL (ref 80.0–100.0)
Platelets: 241 10*3/uL (ref 150–400)
RBC: 4.44 MIL/uL (ref 3.87–5.11)
RDW: 13.2 % (ref 11.5–15.5)
WBC: 7.9 10*3/uL (ref 4.0–10.5)
nRBC: 0 % (ref 0.0–0.2)

## 2021-05-17 LAB — RESP PANEL BY RT-PCR (FLU A&B, COVID) ARPGX2
Influenza A by PCR: NEGATIVE
Influenza B by PCR: NEGATIVE
SARS Coronavirus 2 by RT PCR: NEGATIVE

## 2021-05-17 LAB — I-STAT CHEM 8, ED
BUN: 24 mg/dL — ABNORMAL HIGH (ref 8–23)
Calcium, Ion: 1.24 mmol/L (ref 1.15–1.40)
Chloride: 105 mmol/L (ref 98–111)
Creatinine, Ser: 0.7 mg/dL (ref 0.44–1.00)
Glucose, Bld: 119 mg/dL — ABNORMAL HIGH (ref 70–99)
HCT: 43 % (ref 36.0–46.0)
Hemoglobin: 14.6 g/dL (ref 12.0–15.0)
Potassium: 3.7 mmol/L (ref 3.5–5.1)
Sodium: 143 mmol/L (ref 135–145)
TCO2: 26 mmol/L (ref 22–32)

## 2021-05-17 LAB — APTT: aPTT: 27 seconds (ref 24–36)

## 2021-05-17 LAB — PROTIME-INR
INR: 1.1 (ref 0.8–1.2)
Prothrombin Time: 13.7 seconds (ref 11.4–15.2)

## 2021-05-17 LAB — CBG MONITORING, ED: Glucose-Capillary: 113 mg/dL — ABNORMAL HIGH (ref 70–99)

## 2021-05-17 MED ORDER — CLEVIDIPINE BUTYRATE 0.5 MG/ML IV EMUL: 0.0000 mg/h | INTRAVENOUS | Status: DC

## 2021-05-17 MED ORDER — LABETALOL HCL 5 MG/ML IV SOLN
INTRAVENOUS | Status: AC
  Administered 2021-05-17: 20 mg via INTRAVENOUS
  Filled 2021-05-17: qty 4

## 2021-05-17 MED ORDER — ACETAMINOPHEN 325 MG PO TABS: 650.0000 mg | ORAL_TABLET | Freq: Four times a day (QID) | ORAL | Status: DC | PRN

## 2021-05-17 MED ORDER — BIOTENE DRY MOUTH MT LIQD: 15.0000 mL | OROMUCOSAL | Status: DC | PRN

## 2021-05-17 MED ORDER — LORAZEPAM 2 MG/ML IJ SOLN: 1.0000 mg | INTRAMUSCULAR | Status: DC | PRN

## 2021-05-17 MED ORDER — ONDANSETRON HCL 4 MG/2ML IJ SOLN
4.0000 mg | Freq: Four times a day (QID) | INTRAMUSCULAR | Status: DC | PRN
  Administered 2021-05-17: 4 mg via INTRAVENOUS
  Filled 2021-05-17: qty 2

## 2021-05-17 MED ORDER — LORAZEPAM 2 MG/ML IJ SOLN
2.0000 mg | Freq: Once | INTRAMUSCULAR | Status: DC
  Filled 2021-05-17: qty 1

## 2021-05-17 MED ORDER — HALOPERIDOL LACTATE 5 MG/ML IJ SOLN: 0.5000 mg | INTRAMUSCULAR | Status: DC | PRN

## 2021-05-17 MED ORDER — DIPHENHYDRAMINE HCL 50 MG/ML IJ SOLN: 12.5000 mg | INTRAMUSCULAR | Status: DC | PRN

## 2021-05-17 MED ORDER — MORPHINE 100MG IN NS 100ML (1MG/ML) PREMIX INFUSION
2.0000 mg/h | INTRAVENOUS | Status: DC
  Administered 2021-05-17 – 2021-05-21 (×3): 2 mg/h via INTRAVENOUS
  Filled 2021-05-17 (×3): qty 100

## 2021-05-17 MED ORDER — LORAZEPAM 2 MG/ML PO CONC: 1.0000 mg | ORAL | Status: DC | PRN

## 2021-05-17 MED ORDER — MORPHINE BOLUS VIA INFUSION
2.0000 mg | INTRAVENOUS | Status: DC | PRN
  Filled 2021-05-17: qty 2

## 2021-05-17 MED ORDER — GLYCOPYRROLATE 1 MG PO TABS
1.0000 mg | ORAL_TABLET | ORAL | Status: DC | PRN
  Filled 2021-05-17: qty 1

## 2021-05-17 MED ORDER — POLYVINYL ALCOHOL 1.4 % OP SOLN
1.0000 [drp] | Freq: Four times a day (QID) | OPHTHALMIC | Status: DC | PRN
  Filled 2021-05-17: qty 15

## 2021-05-17 MED ORDER — SODIUM CHLORIDE 0.9% FLUSH: 3.0000 mL | Freq: Once | INTRAVENOUS | Status: DC

## 2021-05-17 MED ORDER — ACETAMINOPHEN 650 MG RE SUPP
650.0000 mg | Freq: Four times a day (QID) | RECTAL | Status: DC | PRN
  Administered 2021-05-19 – 2021-05-20 (×2): 650 mg via RECTAL
  Filled 2021-05-17 (×3): qty 1

## 2021-05-17 MED ORDER — GLYCOPYRROLATE 0.2 MG/ML IJ SOLN: 0.2000 mg | INTRAMUSCULAR | Status: DC | PRN

## 2021-05-17 MED ORDER — HALOPERIDOL 0.5 MG PO TABS
0.5000 mg | ORAL_TABLET | ORAL | Status: DC | PRN
  Filled 2021-05-17: qty 1

## 2021-05-17 MED ORDER — HALOPERIDOL LACTATE 2 MG/ML PO CONC
0.5000 mg | ORAL | Status: DC | PRN
  Filled 2021-05-17: qty 0.3

## 2021-05-17 MED ORDER — ONDANSETRON 4 MG PO TBDP: 4.0000 mg | ORAL_TABLET | Freq: Four times a day (QID) | ORAL | Status: DC | PRN

## 2021-05-17 MED ORDER — LORAZEPAM 2 MG/ML IJ SOLN
2.0000 mg | Freq: Once | INTRAMUSCULAR | Status: AC
  Administered 2021-05-17: 2 mg via INTRAVENOUS

## 2021-05-17 MED ORDER — LABETALOL HCL 5 MG/ML IV SOLN: 20.0000 mg | Freq: Once | INTRAVENOUS | Status: AC

## 2021-05-17 MED ORDER — IOHEXOL 350 MG/ML SOLN
60.0000 mL | Freq: Once | INTRAVENOUS | Status: AC | PRN
Start: 2021-05-17 — End: 2021-05-17
  Administered 2021-05-17: 60 mL via INTRAVENOUS

## 2021-05-17 MED ORDER — GLYCOPYRROLATE 0.2 MG/ML IJ SOLN
0.2000 mg | INTRAMUSCULAR | Status: DC | PRN
  Administered 2021-05-20: 0.2 mg via INTRAVENOUS
  Filled 2021-05-17: qty 1

## 2021-05-17 MED ORDER — CLEVIDIPINE BUTYRATE 0.5 MG/ML IV EMUL
INTRAVENOUS | Status: AC
  Administered 2021-05-17: 1 mg/h via INTRAVENOUS
  Filled 2021-05-17: qty 50

## 2021-05-17 MED ORDER — LORAZEPAM 1 MG PO TABS: 1.0000 mg | ORAL_TABLET | ORAL | Status: DC | PRN

## 2021-05-17 NOTE — Progress Notes (Addendum)
STROKE TEAM PROGRESS NOTE   INTERVAL HISTORY Her sister is at the bedside.  Family wants to make her comfortable and she appears comfortable in the bed. Spoke with family member, LouAnn and all questions were answered.  CT scan of the head shows a large 8 x 4.5 x 4.2 cm left temporal parenchymal hematoma with estimated blood volume of 76 cubic cc with significant surrounding cytotoxic edema with compression of the left lateral ventricle.  Mild rightward midline shift of 4 mm. Vitals:   06/04/2021 1030 06/06/2021 1045 05/26/2021 1100 05/28/2021 1400  BP: (!) 168/84 (!) 160/87 (!) 164/99 125/67  Pulse: 84 83 89 74  Resp: 14 14 17 19   Temp:      TempSrc:      SpO2: 97% 95% 98% 95%  Height:       CBC:  Recent Labs  Lab 05/20/2021 0406 06/06/2021 0410  WBC 7.9  --   NEUTROABS 6.0  --   HGB 13.9 14.6  HCT 43.1 43.0  MCV 97.1  --   PLT 241  --    Basic Metabolic Panel:  Recent Labs  Lab 05/27/2021 0406 05/16/2021 0410  NA 140 143  K 3.6 3.7  CL 108 105  CO2 24  --   GLUCOSE 125* 119*  BUN 23 24*  CREATININE 0.81 0.70  CALCIUM 9.3  --    Lipid Panel: No results for input(s): CHOL, TRIG, HDL, CHOLHDL, VLDL, LDLCALC in the last 168 hours. HgbA1c: No results for input(s): HGBA1C in the last 168 hours. Urine Drug Screen: No results for input(s): LABOPIA, COCAINSCRNUR, LABBENZ, AMPHETMU, THCU, LABBARB in the last 168 hours.  Alcohol Level No results for input(s): ETH in the last 168 hours.  IMAGING past 24 hours CT HEAD CODE STROKE WO CONTRAST  Result Date: 05/13/2021 CLINICAL DATA:  Code stroke.  81 year old female.  Hypertensive. EXAM: CT HEAD WITHOUT CONTRAST TECHNIQUE: Contiguous axial images were obtained from the base of the skull through the vertex without intravenous contrast. COMPARISON:  Brain MRI 07/30/2019.  Head CT 08/09/2019. FINDINGS: Brain: Lobulated hyperdense intra-axial hemorrhage in the left hemisphere encompasses 80 by 45 by 42 mm (AP by transverse by CC) and extends  throughout the left temporal lobe. Estimated intra-axial blood volume 76 mL. Surrounding edema, and vasogenic edema tracking into the central left hemisphere. No intraventricular extension of blood, subtotal effaced left lateral ventricle. There does appear to be a small volume of extension into the left subarachnoid space such as on series 7, image 21. No subdural space extension suspected. Effaced left lateral ventricle but only mild rightward midline shift of 4 mm at this time. Basilar cisterns remain normal. No ventriculomegaly. Underlying patchy chronic cerebral white matter hypodensity appears not significantly changed. Vascular: Calcified atherosclerosis at the skull base. No suspicious intracranial vascular hyperdensity. Skull: Intermittent motion artifact. No acute osseous abnormality identified. Sinuses/Orbits: Chronic paranasal sinus inflammation with left sphenoid periosteal thickening. Scattered sinus opacification, fluid not significantly changed. Tympanic cavities and mastoids remain clear. Other: Motion artifact. Leftward gaze, but no other acute orbit or scalp soft tissue finding. ASPECTS [facility]([place] Stroke Program Early CT Score) Total score (0-10 with 10 being normal): Not applicable, large left hemisphere intra-axial hemorrhage. IMPRESSION: 1. Large acute intra-axial hemorrhage of the left temporal lobe, estimated at 76 mL. Regional edema, tracking into the white matter capsules. Small volume SAH. No IVH or subdural blood identified. 2. Mass effect on the left lateral ventricle with mild rightward midline shift of 4 mm at this  time. Basilar cisterns remain patent. Critical Value/emergent results were called by telephone at the time of interpretation on Jun 03, 2021 at 04:19 hours to Dr. Derry Lory who verbally acknowledged these results. Electronically Signed   By: Odessa Fleming M.D.   On: 06/03/2021 04:20   CT ANGIO HEAD NECK W WO CM (CODE STROKE)  Addendum Date: 03-Jun-2021   ADDENDUM REPORT: 06-03-2021  04:59 ADDENDUM: Salient findings were communicated to Dr. Derry Lory at 0457 hours on 03-Jun-2021 by text page via the [facility] messaging system. Electronically Signed   By: Odessa Fleming M.D.   On: 06/03/2021 04:59   Result Date: 06/03/2021 CLINICAL DATA:  81 year old female code stroke presentation with large left temporal lobe hemorrhage, estimated at 76 mL. EXAM: CT ANGIOGRAPHY HEAD AND NECK TECHNIQUE: Multidetector CT imaging of the head and neck was performed using the standard protocol during bolus administration of intravenous contrast. Multiplanar CT image reconstructions and MIPs were obtained to evaluate the vascular anatomy. Carotid stenosis measurements (when applicable) are obtained utilizing NASCET criteria, using the distal internal carotid diameter as the denominator. CONTRAST:  68mL OMNIPAQUE IOHEXOL 350 MG/ML SOLN COMPARISON:  Plain head CT 0410 hours. Prior CTA head and neck 07/31/2019. FINDINGS: CTA NECK Skeleton: Absent dentition. Paranasal sinus disease as reported earlier today. Mild for age cervical spine degeneration. No acute osseous abnormality identified. Upper chest: Chronic apical lung scarring with either bronchiectasis or paraseptal emphysema. Some centrilobular upper lobe emphysema. Dependent atelectasis or scarring in both lobes. Left upper lobe peripheral pulmonary nodule measuring 7 mm is stable from a chest CT 05/24/2016 and therefore benign. No superior mediastinal lymphadenopathy. Visible main pulmonary arteries are patent. Other neck: Negative. Aortic arch: Moderate arch atherosclerosis is stable from 2021. Three vessel arch configuration. Right carotid system: Brachiocephalic artery somewhat bulky soft plaque but no significant stenosis. Tortuous right CCA. Minimal plaque at the right carotid bifurcation. Ectatic right ICA without stenosis to the skull base. Mild additional calcified plaque at C1. Left carotid system: Mild left CCA origin plaque without stenosis. Mildly tortuous left  CCA. Moderate soft and calcified plaque at the left ICA origin appears stable since 2021 with up to 50 % stenosis with respect to the distal vessel. Mildly ectatic left ICA then to the skull base. Vertebral arteries: Only mild plaque in the proximal right subclavian artery with no right vertebral artery origin stenosis. Ectatic right V1 segment. Intermittent calcified plaque in the V2 segment without hemodynamically significant stenosis. The vessel remains patent and somewhat ectatic to the skull base. Tortuous proximal left subclavian artery with mild soft plaque and no stenosis. Calcified plaque near the left vertebral artery origin without stenosis. Mildly tortuous left V1 segment. V 1 and V2 segment calcified plaque with mild associated vertebral stenosis. The left vertebral is slightly non dominant and patent to the skull base. CTA HEAD Posterior circulation: Fairly codominant V4 segments with normal PICA origins. No distal vertebral plaque or stenosis. Normal vertebrobasilar junction. Patent basilar artery. Patent SCA and right PCA origins. Fetal type left PCA origin redemonstrated with tortuous left P1 segment. Left PCA branches appear stable and within normal limits. Right PCA branches are stable and within normal limits. Anterior circulation: Both ICA siphons are patent. On the right there is mild to moderate siphon calcified plaque without significant stenosis. Tortuous right siphon. On the left there is mild siphon calcified plaque without stenosis. Normal left posterior communicating artery origin. Patent carotid termini. Dominant left and diminutive or absent ACA A1 segments again noted. Anterior communicating artery is tortuous  but stable without discrete aneurysm. Median artery of the corpus callosum is redemonstrated. Right ACA branches remain within normal limits but there is now tandem severe stenosis of the left ACA A2 on series 12, image 25. The proximal stenosis here was present in 2021 but the  more distal stenosis is new. Right MCA M1 segment, bifurcation, and right MCA branches are stable and within normal limits. Left MCA M1 segment is deviated anteriorly now but remains patent without stenosis. Left MCA bifurcation is patent without stenosis. Anteriorly displaced left MCA branches from the acute temporal lobe hemorrhage. No left MCA branch occlusion is identified. There are small enhancing branches along the anterior margin of the acute hemorrhage, such that a small CTA spot sign is possible on series 8, image 105. But there is no overt contrast extravasation. Venous sinuses: Early contrast timing, but the major dural venous sinuses appear to be patent. Anatomic variants: Mildly dominant right vertebral artery in the neck. Dominant right and diminutive or absent left ACA A1 segment. Review of the MIP images confirms the above findings IMPRESSION: 1. Acute left temporal lobe hemorrhage anteriorly displaces the Left MCA branches. Appearance suspicious for a Small Positive CTA Spot Sign along the anterior hematoma (series 8, image 105), placing the patient at risk for hemorrhage expansion. 2. No large vessel occlusion or intracranial aneurysm identified. 3. But there are tandem severe stenoses of the Left ACA A2 segment, progressed since a 2021 CTA. 4. Arterial ectasia and atherosclerosis from the aortic arch to the skull base, but no extracranial hemodynamically significant stenosis. 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 5. Aortic Atherosclerosis (ICD10-I70.0) and Emphysema (ICD10-J43.9). Benign left upper lobe pulmonary nodule stable since 2018. Electronically Signed: By: Odessa Fleming M.D. On: 05/24/2021 04:56    PHYSICAL EXAM General: Laying comfortably in bed; in no acute distress.  Neuro:eyes partially open but unresponsive.,  Left gaze deviated, does not make eye contact. No response to voice. No spontaneous or intelligible speech, moans, does not follow any commands, does not repeat, does  not identify objects.PERRL, R hemianopsia and left gaze deviation. Does not turn head or respond to speech.   ASSESSMENT/PLAN Yolanda Hopkins is a 81 y.o. female with history of dementia, HTN, prior L ACA stroke, hx of UTI who presents with L gaze preference, R sided weakness and R hemianopsia. Found to have a large left temporal ICH with a spot sign in the L MCA territory. Family would like to proceed with hospice care for the patient under the hospitalist team. No further stroke team recommendations at this point, we are available for any questions or concerns.   Stroke: Large left temporal lobe hemorrhage with significant cytotoxic edema and right midline shift of 63mm likely secondary to hypertension .  Hemorrhagic infarct or aneurysmal bleed less likely Code Stroke- Large acute intra-axial hemorrhage of the left temporal lobe, estimated at 76 mL. Regional edema, tracking into the white matter capsules. Small volume SAH. Mass effect on the left lateral ventricle with mild rightward midline shift of 4 mm  CTA head & neck . Acute left temporal lobe hemorrhage anteriorly displaces the Left MCA branches. Appearance suspicious for a Small Positive CTA Spot Sign along the anterior hematoma  HgbA1c 5.3 VTE prophylaxis - SCDS aspirin 325 mg daily prior to admission, now on No antithrombotic. In leu of comfort care Therapy recommendations:  none Disposition:  Hospice  Hypertension Home meds:  None BP goal less than 150  Hyperlipidemia Home meds:  Atorvastatin 40mg  LDL 158, goal < 70  Other Stroke Risk Factors Advanced Age >/= 1465  Hx stroke/TIA History of TIA History of CVA 2015 and 2021  Other Active Problems Dementia   Hospital day # 0  Patient seen and examined by NP/APP with MD. MD to update note as needed.   Elmer Pickerevon Shafer, DNP, FNP-BC [facility] Pager: [phone]  STROKE MD NOTE :  I have personally obtained history,examined this patient, reviewed notes,  independently viewed imaging studies, participated in medical decision making and plan of care.ROS completed by me personally and pertinent positives fully documented  I have made any additions or clarifications directly to the above note. Agree with note above.  Patient has unfortunately presented with a very large left temporal lobar hematoma with significant cytotoxic edema, midline shift and brain herniation with a very poor neurological exam.  The chances of surviving this and making significant meaningful neurological improvement is quite negligible.  Family understands this and it made her DNR and full comfort care measures only.  Discussed with sister at the bedside and answered questions.This patient is critically ill and at significant risk of neurological worsening, death and care requires constant monitoring of vital signs, hemodynamics,respiratory and cardiac monitoring, extensive review of multiple databases, frequent neurological assessment, discussion with family, other specialists and medical decision making of high complexity.I have made any additions or clarifications directly to the above note.This critical care time does not reflect procedure time, or teaching time or supervisory time of PA/NP/Med Resident etc but could involve care discussion time.  I spent 30 minutes of neurocritical care time  in the care of  this patient.      Delia HeadyPramod Matteus Mcnelly, MD Medical Director [facility] Pager: [phone] 05/16/2021 3:45 PM  To contact Stroke Continuity provider, please refer to [email or web address]. After hours, contact General Neurology

## 2021-05-17 NOTE — ED Notes (Signed)
EDP [place] and Neurologist at bedside. Family has decided to transition pt to comfort care. Verbal order to D/C IV Cleviprex. Medication stopped. See [facility]

## 2021-05-17 NOTE — Assessment & Plan Note (Signed)
-  See above - large hemorrhage with mass effect and edema -Comfort care

## 2021-05-17 NOTE — ED Notes (Signed)
Hospital bed ordered for pt comfort.

## 2021-05-17 NOTE — Assessment & Plan Note (Signed)
See above

## 2021-05-17 NOTE — Assessment & Plan Note (Signed)
-  Patient presenting with apparent CVA, found to have [place] with edema and mass effect -After long discussion with neurology, family has decided to proceed with comfort care only -She will be admitted to [facility] for comfort care and palliative care consult -Patient may be a candidate for residential hospice; family is planning to go look at the hospice facility in [facility] and to assess bed availability there -Comfort care order set utilized -Pain control with low-dose morphine drip, as we are unable to assess patient's level of pain - family is in agreement

## 2021-05-17 NOTE — Consult Note (Addendum)
NEUROLOGY CONSULTATION NOTE   Date of service: May 17, 2021 Patient Name: Yolanda Hopkins MRN:  [id number] DOB:  1941-04-22 Reason for consult: "Stroke code for L gaze and R sided weakness" Requesting Provider: Merryl Hacker, MD _ _ _   _ __   _ __ _ _  __ __   _ __   __ _  History of Present Illness  Yolanda Hopkins is a 81 y.o. female with PMH significant for dementia, HTN, prior L ACA stroke, hx of UTI who presents with L gaze preference, R sided weakness and R hemianopsia. Found to have a large left temporal ICH with a spot sign in the L MCA territory.  I spoke to patient's son Ciearra Leifer and patient's other POA Mr. Joseph Berkshire. Patient was watching TV and seemed at her baseline around 0200. They then heard her fall and went by to see her and she would not talk, was only looking to her left and was not moving her R side. They live in [facility] and called EMS and she was brought in to the ED.  On arrival to the ED a stroke code was activated and she was noted to have global aphasia with L forced gaze deviation, noted to be favoring her left side over her R side with no antigravity movement in RUE and some movement in RLE. She also appears to have R hemianopsia.  Her blood pressure on arrival was elevated to over 200/100. CTH demonstrated a large intraparenchymal hemorrhage about 73ml with regional edema, small volume SAH, mass effect on the left lateral ventricle.  mRS: 3 tNKASE: not offered due to [place] ICH score: 3(GCS of 11(E4,V2,M5), age > 34 and ICH volume over 20mls). NIHSS components Score: Comment  1a Level of Conscious 0[x]  1[]  2[]  3[]      1b LOC Questions 0[]  1[]  2[x]       1c LOC Commands 0[]  1[]  2[x]       2 Best Gaze 0[]  1[]  2[x]       3 Visual 0[]  1[]  2[x]  3[]      4 Facial Palsy 0[]  1[]  2[x]  3[]      5a Motor Arm - left 0[]  1[x]  2[]  3[]  4[]  UN[]    5b Motor Arm - Right 0[]  1[]  2[]  3[x]  4[]  UN[]    6a Motor Leg - Left 0[]  1[]  2[x]  3[]  4[]  UN[]    6b Motor Leg -  Right 0[]  1[]  2[x]  3[]  4[]  UN[]    7 Limb Ataxia 0[x]  1[]  2[]  3[]  UN[]     8 Sensory 0[x]  1[]  2[]  UN[]      9 Best Language 0[]  1[]  2[]  3[x]      10 Dysarthria 0[]  1[]  2[x]  UN[]      11 Extinct. and Inattention 0[]  1[x]  2[]       TOTAL: 24     ROS   Unable to obtain secondary to aphasia.  Past History   Past Medical History:  Diagnosis Date   Dementia ([place])    Hypertension    Stroke (cerebrum) [facility])    March 2021   Tobacco abuse 08/19/2013   UTI (urinary tract infection)    frequent   Vertigo    Past Surgical History:  Procedure Laterality Date   COLONOSCOPY N/A 05/23/2016   Procedure: COLONOSCOPY;  Surgeon: Carol Ada, MD;  Location: [facility];  Service: Endoscopy;  Laterality: N/A;   ESOPHAGOGASTRODUODENOSCOPY N/A 05/23/2016   Procedure: ESOPHAGOGASTRODUODENOSCOPY (EGD);  Surgeon: Carol Ada, MD;  Location: [facility] ENDOSCOPY;  Service: Endoscopy;  Laterality: N/A;  GIVENS CAPSULE STUDY N/A 05/23/2016   Procedure: GIVENS CAPSULE STUDY;  Surgeon: Carol Ada, MD;  Location: [facility];  Service: Endoscopy;  Laterality: N/A;   Family History  Problem Relation Age of Onset   Hypertension Mother    Hypertension Father    Liver cancer Father    Social History   Socioeconomic History   Marital status: Married    Spouse name: Not on file   Number of children: Not on file   Years of education: Not on file   Highest education level: Not on file  Occupational History   Not on file  Tobacco Use   Smoking status: Some Days    Packs/day: 0.50    Years: 55.00    Pack years: 27.50    Types: Cigarettes   Smokeless tobacco: Never  Substance and Sexual Activity   Alcohol use: No   Drug use: No   Sexual activity: Not on file  Other Topics Concern   Not on file  Social History Narrative   Not on file   Social Determinants of Health   Financial Resource Strain: Not on file  Food Insecurity: Not on file  Transportation Needs: Not on file  Physical Activity: Not on file   Stress: Not on file  Social Connections: Not on file   Allergies  Allergen Reactions   Mango Flavor Nausea Only and Other (See Comments)    Flushing and extreme lethargy (effect is long-lasting and familial)   Poison Ivy Extract Rash   Sulfa Antibiotics Rash    Medications  (Not in a hospital admission)    Vitals   Vitals:   06/08/2021 0448 05/22/2021 0451 05/18/2021 0451 05/16/2021 0455  BP: 126/75 125/63  136/65  Pulse: 88 86  86  Resp: (!) 22 (!) 22  (!) 23  Temp:      TempSrc:      SpO2: 97% 97%  98%  Height:   5' (1.524 m)      Body mass index is 25 kg/m.  Physical Exam   General: Laying comfortably in bed; in no acute distress. HENT: Normal oropharynx and mucosa. Normal external appearance of ears and nose. Neck: Supple, no pain or tenderness CV: No JVD. No peripheral edema. Pulmonary: Symmetric Chest rise. Normal respiratory effort. Abdomen: Soft to touch, non-tender. Ext: No cyanosis, edema, or deformity Skin: No rash. Normal palpation of skin.  Musculoskeletal: Normal digits and nails by inspection. No clubbing.  Neurologic Examination  Mental status/Cognition: eyes open, does not make eye contact. No response to voice, closes eyes to loud clap. Speech/language: No spontaneous or intelligible speech, moans, does not follow any commands, does not repeat, does not identify objects. Cranial nerves:   CN II Pupils equal and reactive to light, R hemianopsia.   CN III,IV,VI Forced Left gaze deviation   CN V Corneals positive BL.   CN VII R facial droop   CN VIII Does not turn head towards speech   CN IX & X Unable to assess but seems to be protecting her airway.   CN XI Unable to assess   CN XII midline tongue but does not protrude.   Motor:  Muscle bulk: normal, tone decreaed in RUE. Unable to do detailed strength testing secondary to aphasia. LUE: drifts to the bed when held up RUE: no antigravity movement noted. Internally rotates hand when pinched. RLE:  some antigravity movement LLE: some antigravity movement.  Reflexes:  Right Left Comments  Pectoralis  Biceps (C5/6) 1 1   Brachioradialis (C5/6) 1 1    Triceps (C6/7) 1 1    Patellar (L3/4) 1 1    Achilles (S1) 1 1    Hoffman      Plantar withdraws withdraws   Jaw jerk    Sensation:  Light touch Grimaces to pain in BL Upper extremities and BL Lower extremities.   Pin prick    Temperature    Vibration   Proprioception    Coordination/Complex Motor:  - Finger to Nose unable to assess but no obvious ataxia. - Heel to shin unable to assess. - Rapid alternating movement unable to assess - Gait: Deferred for patient safety.  Labs   CBC:  Recent Labs  Lab 05/20/2021 0406 06/01/2021 0410  WBC 7.9  --   NEUTROABS 6.0  --   HGB 13.9 14.6  HCT 43.1 43.0  MCV 97.1  --   PLT 241  --     Basic Metabolic Panel:  Lab Results  Component Value Date   NA 143 06/05/2021   K 3.7 06/06/2021   CO2 24 05/14/2021   GLUCOSE 119 (H) 05/22/2021   BUN 24 (H) 06/03/2021   CREATININE 0.70 06/01/2021   CALCIUM 9.3 05/16/2021   GFRNONAA >60 05/23/2021   GFRAA >60 08/19/2019   Lipid Panel:  Lab Results  Component Value Date   LDLCALC 158 (H) 07/31/2019   HgbA1c:  Lab Results  Component Value Date   HGBA1C 5.3 07/31/2019   Urine Drug Screen: No results found for: LABOPIA, COCAINSCRNUR, LABBENZ, AMPHETMU, THCU, LABBARB  Alcohol Level No results found for: [place]  CT Head without contrast(Personally reviewed): Large acute intra-axial hemorrhage of the left temporal lobe, estimated at 76 mL. Regional edema, tracking into the white matter capsules. Small volume SAH. No IVH or subdural blood identified. Mass effect on the left lateral ventricle with mild rightward midline shift of 4 mm at this time. Basilar cisterns remain patent.  CT angio Head and Neck with contrast(Personally reviewed): Acute left temporal lobe hemorrhage anteriorly displaces the Left MCA branches. Appearance  suspicious for a Small Positive CTA Spot Sign along the anterior hematoma (series 8, image 105), placing the patient at risk for hemorrhage expansion.  Impression   AQUANETTE LANDECK is a 81 y.o. female with PMH significant for dementia, HTN, prior L ACA stroke, hx of UTI who presents with L gaze preference, R sided weakness and R hemianopsia. Found to have a large left temporal ICH with an ICH score of 76 along with a suspicion for a small positive CTA spot sign in one of the left MCA branches. Her neurologic examination is notable for R sided weakness specifically involving her RUE, R hemianopsia and Global aphasia.  I discussed her case with her son Jaylianis Durtschi and Mr. Joseph Berkshire who are both the POA. Discussed that she is likely to have significant difficulties with both expressing and understanding language going forward along with vision deficit on the right and very likely to have weakness on the right and possibly bed bound from this. She also has baseline dementia in addition to this.  We discussed options including potential Hypertonic saline/mannitol to help with potential swelling from the [place] but this would be a temporizing measure and would not help with the damage already done by this [place]. We can bring down her blood pressure to prevent worsening of the ICH. Also discussed that it would be very unlikely that the Neurosurgery team would offer potential surgery  given that it is unlikely to improve the damage from [place] and unlikely to improve aphasia, hemianopsia or R sided weakness and the concern for potential risk of surgery.  After extensive discussion and considering the face that Ms. Mastandrea had expressed preference for a better quality of life over quantity of life, the family made a joint decision to make her DNR and comfort care.  Impression: Large Intracerebral hemorrhage with cerebral edema, mass effect and midline shift. Cerebral compression Hypertensive emergency Global  aphasia R hemianopsia Right sided weakness Prior left Anterior cerebral artery stroke. History of dementia with behavioral disturbance History of tobacco use Hyperlipidemia. Peripheral artery disease. History of Left ICA stenosis.  Recommendations  - Family opted DNR and comfort care at this time. Recommend transition to comfort care. ______________________________________________________________________  This patient is critically ill and at significant risk of neurological worsening, death and care requires constant monitoring of vital signs, hemodynamics,respiratory and cardiac monitoring, neurological assessment, discussion with family, other specialists and medical decision making of high complexity. I spent 60 minutes of neurocritical care time  in the care of  this patient. This was time spent independent of any time provided by nurse practitioner or PA.  Donnetta Simpers [facility] Pager Number [phone] 06/08/2021  5:56 AM   Thank you for the opportunity to take part in the care of this patient. If you have any further questions, please contact the neurology consultation attending.  Signed,  [place] Pager Number [phone] _ _ _   _ __   _ __ _ _  __ __   _ __   __ _

## 2021-05-17 NOTE — ED Provider Notes (Addendum)
[facility] EMERGENCY DEPARTMENT Provider Note   CSN: [id number] Arrival date & time: 06/10/2021  0354  An emergency department physician performed an initial assessment on this suspected stroke patient at 0358.  History  Chief Complaint  Patient presents with   Code Stroke    Yolanda Hopkins is a 81 y.o. female.  HPI     This is an 81 year old female with a history of TIA, dementia, hypertension who presents via EMS with concerns for stroke.  Per EMS, they were called out to the house after the family found the patient on the floor around 3:30 AM.  She previously had been noted to be walking around on her walker.  They believe her last known well was just before 2:30 AM.  EMS noted a left gaze deviation and right-sided paralysis.  They did not initiate a code stroke in the field.  They did not administer any medications.  Had a normal blood sugar in the field.  Patient is unable to provide any history.  No collateral information available at this time.  Level 5 caveat  Home Medications Prior to Admission medications   Medication Sig Start Date End Date Taking? Authorizing Provider  acetaminophen (TYLENOL) 500 MG tablet 1 tablet as needed    [provider]  aspirin 325 MG tablet 1 tablet    [provider]  atorvastatin (LIPITOR) 40 MG tablet Take 1 tablet by mouth daily.    [provider]  clotrimazole (MYCELEX) 10 MG troche SMARTSIG:1 Lozenge(s) By Mouth 5 Times Daily 06/22/20   [provider]  fluconazole (DIFLUCAN) 100 MG tablet 1 tablet 09/07/20   [provider]  metroNIDAZOLE (FLAGYL) 250 MG tablet Take by mouth. 06/21/20   [provider]  mirtazapine (REMERON) 30 MG tablet Take 30 mg by mouth at bedtime. 05/31/20   [provider]  nicotine (NICODERM CQ - DOSED IN MG/24 HOURS) 14 mg/24hr patch 14 mg daily. 11/10/19   [provider]  nicotine (NICODERM CQ - DOSED IN MG/24 HR) 7 mg/24hr  patch 7 mg patch daily for two weeks and stop 08/19/19   Angiulli, Lavon Paganini, PA-C  ondansetron (ZOFRAN-ODT) 4 MG disintegrating tablet 1 tablet on the tongue and allow to dissolve  as needed    [provider]  tiZANidine (ZANAFLEX) 2 MG tablet Take 1 tablet (2 mg total) by mouth 2 (two) times daily as needed for muscle spasms. 02/09/20   Scot Jun, FNP      Allergies    Mango flavor, Poison ivy extract, and Sulfa antibiotics    Review of Systems   Review of Systems  Unable to perform ROS: Mental status change   Physical Exam Updated Vital Signs BP 139/67    Pulse 91    Temp 98.4 F (36.9 C) (Oral)    Resp 18    Ht 1.524 m (5')    SpO2 97%    BMI 25.00 kg/m  Physical Exam Vitals and nursing note reviewed.  Constitutional:      Appearance: She is well-developed.     Comments: Ill-appearing but nontoxic gaze deviation to the left  HENT:     Head: Normocephalic and atraumatic.     Mouth/Throat:     Mouth: Mucous membranes are moist.  Eyes:     Pupils: Pupils are equal, round, and reactive to light.     Comments: Pupils appear equal and reactive, left gaze preference, does not cross midline  Cardiovascular:  Rate and Rhythm: Normal rate and regular rhythm.  Pulmonary:     Effort: Pulmonary effort is normal. No respiratory distress.  Abdominal:     Palpations: Abdomen is soft.  Musculoskeletal:     Cervical back: Neck supple.     Right lower leg: No edema.     Left lower leg: No edema.  Skin:    General: Skin is warm and dry.  Neurological:     Mental Status: She is alert.     Comments: Unable to assess orientation, right-sided facial droop noted, right-sided neglect, left gaze preference, flaccid paralysis right upper extremity  Psychiatric:     Comments: Unable to assess    ED Results / Procedures / Treatments   Labs (all labs ordered are listed, but only abnormal results are displayed) Labs Reviewed  COMPREHENSIVE METABOLIC PANEL - Abnormal; Notable  for the following components:      Result Value   Glucose, Bld 125 (*)    All other components within normal limits  CBG MONITORING, ED - Abnormal; Notable for the following components:   Glucose-Capillary 113 (*)    All other components within normal limits  I-STAT CHEM 8, ED - Abnormal; Notable for the following components:   BUN 24 (*)    Glucose, Bld 119 (*)    All other components within normal limits  RESP PANEL BY RT-PCR (FLU A&B, COVID) [id number]  PROTIME-INR  APTT  CBC  DIFFERENTIAL  CBG MONITORING, ED    EKG EKG Interpretation  Date/Time:  Thursday May 17 2021 04:41:38 EST Ventricular Rate:  89 PR Interval:  213 QRS Duration: 94 QT Interval:  388 QTC Calculation: 473 R Axis:   -28 Text Interpretation: Sinus rhythm Borderline prolonged PR interval Consider right atrial enlargement Abnormal R-wave progression, early transition LVH by voltage When compared with ECG of 08/04/2019, No significant change was found Confirmed by Delora Fuel ([id number]) on 05/29/2021 5:01:07 AM  Radiology CT HEAD CODE STROKE WO CONTRAST  Result Date: 05/18/2021 CLINICAL DATA:  Code stroke.  81 year old female.  Hypertensive. EXAM: CT HEAD WITHOUT CONTRAST TECHNIQUE: Contiguous axial images were obtained from the base of the skull through the vertex without intravenous contrast. COMPARISON:  Brain MRI 07/30/2019.  Head CT 08/09/2019. FINDINGS: Brain: Lobulated hyperdense intra-axial hemorrhage in the left hemisphere encompasses 80 by 45 by 42 mm (AP by transverse by CC) and extends throughout the left temporal lobe. Estimated intra-axial blood volume 76 mL. Surrounding edema, and vasogenic edema tracking into the central left hemisphere. No intraventricular extension of blood, subtotal effaced left lateral ventricle. There does appear to be a small volume of extension into the left subarachnoid space such as on series 7, image 21. No subdural space extension suspected. Effaced left lateral ventricle but  only mild rightward midline shift of 4 mm at this time. Basilar cisterns remain normal. No ventriculomegaly. Underlying patchy chronic cerebral white matter hypodensity appears not significantly changed. Vascular: Calcified atherosclerosis at the skull base. No suspicious intracranial vascular hyperdensity. Skull: Intermittent motion artifact. No acute osseous abnormality identified. Sinuses/Orbits: Chronic paranasal sinus inflammation with left sphenoid periosteal thickening. Scattered sinus opacification, fluid not significantly changed. Tympanic cavities and mastoids remain clear. Other: Motion artifact. Leftward gaze, but no other acute orbit or scalp soft tissue finding. ASPECTS [facility] Stroke Program Early CT Score) Total score (0-10 with 10 being normal): Not applicable, large left hemisphere intra-axial hemorrhage. IMPRESSION: 1. Large acute intra-axial hemorrhage of the left temporal lobe, estimated at 76 mL. Regional edema, tracking  into the white matter capsules. Small volume SAH. No IVH or subdural blood identified. 2. Mass effect on the left lateral ventricle with mild rightward midline shift of 4 mm at this time. Basilar cisterns remain patent. Critical Value/emergent results were called by telephone at the time of interpretation on 05/27/2021 at 04:19 hours to Dr. Lorrin Goodell who verbally acknowledged these results. Electronically Signed   By: Genevie Ann M.D.   On: 06/02/2021 04:20   CT ANGIO HEAD NECK W WO CM (CODE STROKE)  Addendum Date: 05/26/2021   ADDENDUM REPORT: 05/31/2021 04:59 ADDENDUM: Salient findings were communicated to Dr. Lorrin Goodell at 0457 hours on 06/07/2021 by text page via the [facility] messaging system. Electronically Signed   By: Genevie Ann M.D.   On: 05/25/2021 04:59   Result Date: 05/29/2021 CLINICAL DATA:  81 year old female code stroke presentation with large left temporal lobe hemorrhage, estimated at 76 mL. EXAM: CT ANGIOGRAPHY HEAD AND NECK TECHNIQUE: Multidetector CT imaging of the  head and neck was performed using the standard protocol during bolus administration of intravenous contrast. Multiplanar CT image reconstructions and MIPs were obtained to evaluate the vascular anatomy. Carotid stenosis measurements (when applicable) are obtained utilizing NASCET criteria, using the distal internal carotid diameter as the denominator. CONTRAST:  76mL OMNIPAQUE IOHEXOL 350 MG/ML SOLN COMPARISON:  Plain head CT 0410 hours. Prior CTA head and neck 07/31/2019. FINDINGS: CTA NECK Skeleton: Absent dentition. Paranasal sinus disease as reported earlier today. Mild for age cervical spine degeneration. No acute osseous abnormality identified. Upper chest: Chronic apical lung scarring with either bronchiectasis or paraseptal emphysema. Some centrilobular upper lobe emphysema. Dependent atelectasis or scarring in both lobes. Left upper lobe peripheral pulmonary nodule measuring 7 mm is stable from a chest CT 05/24/2016 and therefore benign. No superior mediastinal lymphadenopathy. Visible main pulmonary arteries are patent. Other neck: Negative. Aortic arch: Moderate arch atherosclerosis is stable from 2021. Three vessel arch configuration. Right carotid system: Brachiocephalic artery somewhat bulky soft plaque but no significant stenosis. Tortuous right CCA. Minimal plaque at the right carotid bifurcation. Ectatic right ICA without stenosis to the skull base. Mild additional calcified plaque at C1. Left carotid system: Mild left CCA origin plaque without stenosis. Mildly tortuous left CCA. Moderate soft and calcified plaque at the left ICA origin appears stable since 2021 with up to 50 % stenosis with respect to the distal vessel. Mildly ectatic left ICA then to the skull base. Vertebral arteries: Only mild plaque in the proximal right subclavian artery with no right vertebral artery origin stenosis. Ectatic right V1 segment. Intermittent calcified plaque in the V2 segment without hemodynamically significant  stenosis. The vessel remains patent and somewhat ectatic to the skull base. Tortuous proximal left subclavian artery with mild soft plaque and no stenosis. Calcified plaque near the left vertebral artery origin without stenosis. Mildly tortuous left V1 segment. V 1 and V2 segment calcified plaque with mild associated vertebral stenosis. The left vertebral is slightly non dominant and patent to the skull base. CTA HEAD Posterior circulation: Fairly codominant V4 segments with normal PICA origins. No distal vertebral plaque or stenosis. Normal vertebrobasilar junction. Patent basilar artery. Patent SCA and right PCA origins. Fetal type left PCA origin redemonstrated with tortuous left P1 segment. Left PCA branches appear stable and within normal limits. Right PCA branches are stable and within normal limits. Anterior circulation: Both ICA siphons are patent. On the right there is mild to moderate siphon calcified plaque without significant stenosis. Tortuous right siphon. On the left there  is mild siphon calcified plaque without stenosis. Normal left posterior communicating artery origin. Patent carotid termini. Dominant left and diminutive or absent ACA A1 segments again noted. Anterior communicating artery is tortuous but stable without discrete aneurysm. Median artery of the corpus callosum is redemonstrated. Right ACA branches remain within normal limits but there is now tandem severe stenosis of the left ACA A2 on series 12, image 25. The proximal stenosis here was present in 2021 but the more distal stenosis is new. Right MCA M1 segment, bifurcation, and right MCA branches are stable and within normal limits. Left MCA M1 segment is deviated anteriorly now but remains patent without stenosis. Left MCA bifurcation is patent without stenosis. Anteriorly displaced left MCA branches from the acute temporal lobe hemorrhage. No left MCA branch occlusion is identified. There are small enhancing branches along the  anterior margin of the acute hemorrhage, such that a small CTA spot sign is possible on series 8, image 105. But there is no overt contrast extravasation. Venous sinuses: Early contrast timing, but the major dural venous sinuses appear to be patent. Anatomic variants: Mildly dominant right vertebral artery in the neck. Dominant right and diminutive or absent left ACA A1 segment. Review of the MIP images confirms the above findings IMPRESSION: 1. Acute left temporal lobe hemorrhage anteriorly displaces the Left MCA branches. Appearance suspicious for a Small Positive CTA Spot Sign along the anterior hematoma (series 8, image 105), placing the patient at risk for hemorrhage expansion. 2. No large vessel occlusion or intracranial aneurysm identified. 3. But there are tandem severe stenoses of the Left ACA A2 segment, progressed since a 2021 CTA. 4. Arterial ectasia and atherosclerosis from the aortic arch to the skull base, but no extracranial hemodynamically significant stenosis. 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 5. Aortic Atherosclerosis (ICD10-I70.0) and Emphysema (ICD10-J43.9). Benign left upper lobe pulmonary nodule stable since 2018. Electronically Signed: By: Genevie Ann M.D. On: 05/14/2021 04:56    Procedures .Critical Care Performed by: Merryl Hacker, MD Authorized by: Merryl Hacker, MD   Critical care provider statement:    Critical care time (minutes):  30   Critical care was necessary to treat or prevent imminent or life-threatening deterioration of the following conditions:  CNS failure or compromise   Critical care was time spent personally by me on the following activities:  Development of treatment plan with patient or surrogate, discussions with consultants, evaluation of patient's response to treatment, examination of patient, ordering and review of laboratory studies, ordering and review of radiographic studies, ordering and performing treatments and interventions, pulse  oximetry, re-evaluation of patient's condition and review of old charts    Medications Ordered in ED Medications  sodium chloride flush (NS) 0.9 % injection 3 mL (3 mLs Intravenous Not Given 06/09/2021 0451)  clevidipine (CLEVIPREX) infusion 0.5 mg/mL (0 mg/hr Intravenous Stopped 05/25/2021 0515)  labetalol (NORMODYNE) injection 20 mg (20 mg Intravenous Given 05/29/2021 0410)  iohexol (OMNIPAQUE) 350 MG/ML injection 60 mL (60 mLs Intravenous Contrast Given 05/16/2021 0431)    ED Course/ Medical Decision Making/ A&P Clinical Course as of 06/06/2021 0517  Thu May 17, 2021  0516 Spoke with Dr. Lorrin Goodell, neurology.  He spoke to both power of attorney's for the patient.  She will be transition to comfort care.  DNR.  We will plan to stop Cleviprex drip and consult with hospitalist for admission.  She would benefit from palliative care consultation. [CH]    Clinical Course User Index [CH] Trevon Strothers, Loma Sousa  F, MD                           Medical Decision Making  This patient presents to the ED for concern of stroke, this involves an extensive number of treatment options, and is a complaint that carries with it a high risk of complications and morbidity.  The differential diagnosis includes hemorrhagic versus ischemic stroke, trauma  MDM:    81 year old female who presents with focal strokelike symptoms.  She is concerning for flaccid paralysis and neglect.  She is taken emergently to CT scanner.  Initial review of CT imaging of the head reveals a large intraparenchymal hemorrhage on the left.  She is significantly hypertensive.  Cleviprex was ordered.  I attempted to contact family for collateral information.  No one available at this time. unknown CODE STATUS.  5:17 AM Neurology was able to contact 1 family member.  Goals of care being discussed.  Plan for patient admission.     (Labs, imaging)  Labs: I Ordered, and personally interpreted labs.  The pertinent results include: Pending   Imaging  Studies ordered: I ordered imaging studies including large intraparenchymal hemorrhage I independently visualized and interpreted imaging. I agree with the radiologist interpretation  Critical Interventions: IV blood pressure control with Cleviprex   Consultations Obtained: I requested consultation with the neurology,  and discussed lab and imaging findings as well as pertinent plan - they recommend: Admission  Reevaluation: After the interventions noted above, I reevaluated the patient and found that they have :stayed the same  Social Determinants of Health: Unknown  Disposition: Admission  Co morbidities that complicate the patient evaluation  Past Medical History:  Diagnosis Date   Dementia ([place])    Hypertension    Stroke (cerebrum) ([place])    March 2021   Tobacco abuse 08/19/2013   UTI (urinary tract infection)    frequent   Vertigo       Additional history obtained from unavailable External records from outside source obtained and reviewed including chart review   Cardiac Monitoring: The patient was maintained on a cardiac monitor.  I personally viewed and interpreted the cardiac monitored which showed an underlying rhythm of: NSR   Medicines  Meds ordered this encounter  Medications   sodium chloride flush (NS) 0.9 % injection 3 mL   clevidipine (CLEVIPREX) infusion 0.5 mg/mL   clevidipine (CLEVIPREX) 0.5 MG/ML infusion    Mancheril, Benjamin : cabinet override   labetalol (NORMODYNE) injection 20 mg   labetalol (NORMODYNE) 5 MG/ML injection    Hopper, Mindy A: cabinet override   iohexol (OMNIPAQUE) 350 MG/ML injection 60 mL     I have reviewed the patients home medicines and have made adjustments as needed   Problem List / ED Course: Problem List Items Addressed This Visit   None Visit Diagnoses     Hemorrhagic stroke [facility])    -  Primary                   Final Clinical Impression(s) / ED Diagnoses Final diagnoses:  Hemorrhagic  stroke [facility])    Rx / DC Orders ED Discharge Orders     None         Rutger Salton, Barbette Hair, MD 06/03/2021 [id number]    Merryl Hacker, MD 05/23/2021 [phone]

## 2021-05-17 NOTE — Assessment & Plan Note (Signed)
-  will stop home medications

## 2021-05-17 NOTE — Consult Note (Signed)
Consultation Note Date: 05/31/2021   Patient Name: Yolanda Hopkins  DOB: Oct 30, 1940  MRN: [id number]  Age / Sex: 81 y.o., female  PCP: Adaline Sill, NP Referring Physician: Karmen Bongo, MD  Reason for Consultation: Establishing goals of care, end of life care  HPI/Patient Profile: 81 y.o. female  with past medical history of dementia, HTN, and CVA in 2021 presenting to the emergency department on 05/20/2021 with a fall. Family found her down at home. On presentation, she was unresponsive other than to pain. Code stroke was called in the ED. CTA head showed acute left temporal hemorrhage anteriorly displacing the left MCA branches. After discussion with neurology, the family agreed to proceed with comfort measures. Admitted to [facility] for end of life care.   Clinical Assessment and Goals of Care: I have reviewed medical records including EPIC notes, labs and imaging, and went to see patient in the ED. On my assessment, patient appears comfortable. Morphine infusion is at 2 mg/hr. She is unresponsive to voice and light touch. No non-verbal signs of pain or discomfort noted. Respirations are even and unlabored. No excessive respiratory secretions noted.   No family present at bedside currently. I spoke her son Yolanda Hopkins by phone to discuss [place] and options. He is the documented HCPOA on file in [place]. I introduced Palliative Medicine as specialized medical care for people living with serious illness. It focuses on providing relief from the symptoms and stress of a serious illness.   We discussed a brief social history. Patient is widowed. She has 4 children. She has been living in her home in the [place] area. Son Yolanda Hopkins stays with her round the clock.   We discussed her current situation and what it means in the larger context of her ongoing co-morbidities. Discussed the natural disease trajectory of a  sudden, severe neurological injury such as large ICH. Discussed that this is a devastating event, with little to no chance that patient could return to her previous baseline.   The difference between full scope medical intervention and comfort care was considered. Confirmed that goal of care is for comfort measures only. I reviewed the concept of comfort care with family, emphasizing that this path means allowing a natural course to occur. Discussed that the goal is comfort and dignity rather than cure/prolonging life. Discussed patient will be cared for by keeping her clean, warm, and dry and that she will be given medications as needed for symptom management at EOL. Discussed that she will not receive unnecessary interventions such as labs, artificial hydration or feeding, or antibiotics. Family verbalizes understanding.   Additional education and counseling provided on trajectory at EOL. Discussed that prognosis was likely hours to days. Emotional support provided.    Questions and concerns were addressed.  The family was encouraged to call with questions or concerns.    Primary decision maker: Yolanda Hopkins, son/HCPOA    SUMMARY OF RECOMMENDATIONS   Full comfort measures  DNR/DNI as previously documented EOL order set has been utilized Continue morphine infusion  PRN medications are available for symptom management at EOL PMT will continue to follow   Symptom Management:  Lorazepam (ATIVAN) prn for anxiety Haloperidol (HALDOL) prn for agitation  Glycopyrrolate (ROBINUL) for excessive secretions Ondansetron (ZOFRAN) prn for nausea Polyvinyl alcohol (LIQUIFILM TEARS) prn for dry eyes Antiseptic oral rinse (BIOTENE) prn for dry mouth  Additional Recommendations (Limitations, Scope, Preferences): Full Comfort Care  Prognosis:  Hours - Days  Discharge Planning: Anticipated Hospital Death      Primary Diagnoses: Present on Admission:  Intracerebral hemorrhage  Acute CVA  (cerebrovascular accident) ([place])  Dementia ([place])  Dyslipidemia  HTN (hypertension)   I have reviewed the medical record, interviewed the patient and family, and examined the patient. The following aspects are pertinent.  Past Medical History:  Diagnosis Date   Dementia ([place])    Hypertension    Stroke (cerebrum) ([place])    March 2021   Tobacco abuse 08/19/2013   UTI (urinary tract infection)    frequent   Vertigo      Scheduled Meds: Continuous Infusions:  morphine 2 mg/hr (05/26/2021 0951)   PRN Meds:.acetaminophen **OR** acetaminophen, antiseptic oral rinse, diphenhydrAMINE, glycopyrrolate **OR** glycopyrrolate **OR** glycopyrrolate, haloperidol **OR** haloperidol **OR** haloperidol lactate, LORazepam **OR** LORazepam **OR** LORazepam, morphine, ondansetron **OR** ondansetron (ZOFRAN) IV, polyvinyl alcohol Medications Prior to Admission:  Prior to Admission medications   Not on File   Allergies  Allergen Reactions   Mango Flavor Nausea Only and Other (See Comments)    Flushing and extreme lethargy (effect is long-lasting and familial)   Poison Ivy Extract Rash   Sulfa Antibiotics Rash   Review of Systems  Unable to perform ROS: Patient unresponsive   Physical Exam Vitals reviewed.  Constitutional:      General: She is not in acute distress.    Appearance: She is ill-appearing.  Pulmonary:     Effort: Pulmonary effort is normal.  Neurological:     Mental Status: She is unresponsive.    Vital Signs: BP 125/67    Pulse 74    Temp 98.4 F (36.9 C) (Oral)    Resp 19    Ht 5' (1.524 m)    SpO2 95%    BMI 25.00 kg/m  Pain Scale: CPOT   Pain Score: Asleep   SpO2: SpO2: 95 % O2 Device:SpO2: 95 % O2 Flow Rate: .      Palliative Assessment/Data: PPS 10%     Time In: 1510 Time Out: 1600 Time Total: 50 minutes Greater than 50%  of this time was spent counseling and coordinating care related to the above assessment and plan.  Signed by: Lavena Bullion, NP    Please contact Palliative Medicine Team phone at [phone] for questions and concerns.  For individual provider: See Shea Evans

## 2021-05-17 NOTE — H&P (Signed)
History and Physical    Patient: Yolanda Lusheratricia A Carmer [id number] DOB: Oct 29, 1940 DOA: 05/20/2021 DOS: the patient was seen and examined on 05/22/2021 PCP: Rebekah Chesterfieldickey, Kirkland M, NP  Patient coming from: Home - lives with family; NOK: Benay PikeSon, Christopher Bendavid, [phone]   Chief Complaint: Fall  HPI: Yolanda Hopkins is a 81 y.o. female with medical history significant of dementia; HTN; and CVA in 2021 presenting with a fall.  Family found her down at home.  She is unresponsive other than somewhat to pain.  After discussion with neurology, the family agreed to proceed with comfort measures.  They are in agreement with morphine drip.  They [facility] for [facility].  [facility] will [facility] to assess the facility.    ER Course:  Carryover, per Dr. Toniann FailKakrakandy:  admitted for large intracranial bleed.  Patient's family has moved to comfort measures only.   Review of Systems: Unable to review all systems due to lack of cooperation from patient. Past Medical History:  Diagnosis Date   Dementia (HCC)    Hypertension    Stroke (cerebrum) [facility](HCC)    March 2021   Tobacco abuse 08/19/2013   UTI (urinary tract infection)    frequent   Vertigo    Past Surgical History:  Procedure Laterality Date   COLONOSCOPY N/A 05/23/2016   Procedure: COLONOSCOPY;  Surgeon: Jeani HawkingPatrick Hung, MD;  [facility];  Service: Endoscopy;  Laterality: N/A;   ESOPHAGOGASTRODUODENOSCOPY N/A 05/23/2016   Procedure: ESOPHAGOGASTRODUODENOSCOPY (EGD);  Surgeon: Jeani HawkingPatrick Hung, MD;  [facility];  Service: Endoscopy;  Laterality: N/A;   GIVENS CAPSULE STUDY N/A 05/23/2016   Procedure: GIVENS CAPSULE STUDY;  Surgeon: Jeani HawkingPatrick Hung, MD;  [facility];  Service: Endoscopy;  Laterality: N/A;   Social History:  reports that she has been smoking cigarettes. She has a 27.50 pack-year smoking history. She has never used smokeless tobacco. She reports that she does not drink alcohol and  does not use drugs.  Allergies  Allergen Reactions   Mango Flavor Nausea Only and Other (See Comments)    Flushing and extreme lethargy (effect is long-lasting and familial)   Poison Ivy Extract Rash   Sulfa Antibiotics Rash    Family History  Problem Relation Age of Onset   Hypertension Mother    Hypertension Father    Liver cancer Father     Prior to Admission medications   Medication Sig Start Date End Date Taking? Authorizing Provider  nicotine (NICODERM CQ - DOSED IN MG/24 HR) 7 mg/24hr patch 7 mg patch daily for two weeks and stop Patient not taking: Reported on 05/26/2021 08/19/19   Angiulli, Mcarthur Rossettianiel J, PA-C  tiZANidine (ZANAFLEX) 2 MG tablet Take 1 tablet (2 mg total) by mouth 2 (two) times daily as needed for muscle spasms. Patient not taking: Reported on 05/28/2021 02/09/20   Bing NeighborsHarris, Kimberly S, FNP    Physical Exam: Vitals:   10-30-2021 1015 10-30-2021 1030 10-30-2021 1045 10-30-2021 1100  BP: (!) 164/92 (!) 168/84 (!) 160/87 (!) 164/99  Pulse: 83 84 83 89  Resp: 15 14 14 17   Temp:      TempSrc:      SpO2: 95% 97% 95% 98%  Height:       General:  Appears calm and comfortable and is in NAD, responds only to pain Eyes:   normal lids, iris ENT:  grossly normal lips  Neck:  no LAD, masses or thyromegaly Cardiovascular:  RRR, no m/r/g. No LE  edema.  Respiratory:   CTA bilaterally with no wheezes/rales/rhonchi.  Normal respiratory effort. Abdomen:  soft, NT, ND Skin:  no rash or induration seen on limited exam Musculoskeletal:  appears to have decreased tone of RUE >RLE Psychiatric:  obtunded, unresponsive except to pain Neurologic:  unable to perform   Radiological Exams on Admission: Independently reviewed - see discussion in A/P where applicable  CT HEAD CODE STROKE WO CONTRAST  Result Date: 05/31/2021 CLINICAL DATA:  Code stroke.  81 year old female.  Hypertensive. EXAM: CT HEAD WITHOUT CONTRAST TECHNIQUE: Contiguous axial images were obtained from the base of the skull  through the vertex without intravenous contrast. COMPARISON:  Brain MRI 07/30/2019.  Head CT 08/09/2019. FINDINGS: Brain: Lobulated hyperdense intra-axial hemorrhage in the left hemisphere encompasses 80 by 45 by 42 mm (AP by transverse by CC) and extends throughout the left temporal lobe. Estimated intra-axial blood volume 76 mL. Surrounding edema, and vasogenic edema tracking into the central left hemisphere. No intraventricular extension of blood, subtotal effaced left lateral ventricle. There does appear to be a small volume of extension into the left subarachnoid space such as on series 7, image 21. No subdural space extension suspected. Effaced left lateral ventricle but only mild rightward midline shift of 4 mm at this time. Basilar cisterns remain normal. No ventriculomegaly. Underlying patchy chronic cerebral white matter hypodensity appears not significantly changed. Vascular: Calcified atherosclerosis at the skull base. No suspicious intracranial vascular hyperdensity. Skull: Intermittent motion artifact. No acute osseous abnormality identified. Sinuses/Orbits: Chronic paranasal sinus inflammation with left sphenoid periosteal thickening. Scattered sinus opacification, fluid not significantly changed. Tympanic cavities and mastoids remain clear. Other: Motion artifact. Leftward gaze, but no other acute orbit or scalp soft tissue finding. ASPECTS [facility] Stroke Program Early CT Score) Total score (0-10 with 10 being normal): Not applicable, large left hemisphere intra-axial hemorrhage. IMPRESSION: 1. Large acute intra-axial hemorrhage of the left temporal lobe, estimated at 76 mL. Regional edema, tracking into the white matter capsules. Small volume SAH. No IVH or subdural blood identified. 2. Mass effect on the left lateral ventricle with mild rightward midline shift of 4 mm at this time. Basilar cisterns remain patent. Critical Value/emergent results were called by telephone at the time of  interpretation on 05/16/2021 at 04:19 hours to Dr. Derry Lory who verbally acknowledged these results. Electronically Signed   By: Odessa Fleming M.D.   On: 06/04/2021 04:20   CT ANGIO HEAD NECK W WO CM (CODE STROKE)  Addendum Date: 05/22/2021   ADDENDUM REPORT: 06/09/2021 04:59 ADDENDUM: Salient findings were communicated to Dr. Derry Lory at 0457 hours on 06/10/2021 by text page via the [facility] messaging system. Electronically Signed   By: Odessa Fleming M.D.   On: 05/24/2021 04:59   Result Date: 06/10/2021 CLINICAL DATA:  81 year old female code stroke presentation with large left temporal lobe hemorrhage, estimated at 76 mL. EXAM: CT ANGIOGRAPHY HEAD AND NECK TECHNIQUE: Multidetector CT imaging of the head and neck was performed using the standard protocol during bolus administration of intravenous contrast. Multiplanar CT image reconstructions and MIPs were obtained to evaluate the vascular anatomy. Carotid stenosis measurements (when applicable) are obtained utilizing NASCET criteria, using the distal internal carotid diameter as the denominator. CONTRAST:  64mL OMNIPAQUE IOHEXOL 350 MG/ML SOLN COMPARISON:  Plain head CT 0410 hours. Prior CTA head and neck 07/31/2019. FINDINGS: CTA NECK Skeleton: Absent dentition. Paranasal sinus disease as reported earlier today. Mild for age cervical spine degeneration. No acute osseous abnormality identified. Upper chest: Chronic apical lung scarring  with either bronchiectasis or paraseptal emphysema. Some centrilobular upper lobe emphysema. Dependent atelectasis or scarring in both lobes. Left upper lobe peripheral pulmonary nodule measuring 7 mm is stable from a chest CT 05/24/2016 and therefore benign. No superior mediastinal lymphadenopathy. Visible main pulmonary arteries are patent. Other neck: Negative. Aortic arch: Moderate arch atherosclerosis is stable from 2021. Three vessel arch configuration. Right carotid system: Brachiocephalic artery somewhat bulky soft plaque but no  significant stenosis. Tortuous right CCA. Minimal plaque at the right carotid bifurcation. Ectatic right ICA without stenosis to the skull base. Mild additional calcified plaque at C1. Left carotid system: Mild left CCA origin plaque without stenosis. Mildly tortuous left CCA. Moderate soft and calcified plaque at the left ICA origin appears stable since 2021 with up to 50 % stenosis with respect to the distal vessel. Mildly ectatic left ICA then to the skull base. Vertebral arteries: Only mild plaque in the proximal right subclavian artery with no right vertebral artery origin stenosis. Ectatic right V1 segment. Intermittent calcified plaque in the V2 segment without hemodynamically significant stenosis. The vessel remains patent and somewhat ectatic to the skull base. Tortuous proximal left subclavian artery with mild soft plaque and no stenosis. Calcified plaque near the left vertebral artery origin without stenosis. Mildly tortuous left V1 segment. V 1 and V2 segment calcified plaque with mild associated vertebral stenosis. The left vertebral is slightly non dominant and patent to the skull base. CTA HEAD Posterior circulation: Fairly codominant V4 segments with normal PICA origins. No distal vertebral plaque or stenosis. Normal vertebrobasilar junction. Patent basilar artery. Patent SCA and right PCA origins. Fetal type left PCA origin redemonstrated with tortuous left P1 segment. Left PCA branches appear stable and within normal limits. Right PCA branches are stable and within normal limits. Anterior circulation: Both ICA siphons are patent. On the right there is mild to moderate siphon calcified plaque without significant stenosis. Tortuous right siphon. On the left there is mild siphon calcified plaque without stenosis. Normal left posterior communicating artery origin. Patent carotid termini. Dominant left and diminutive or absent ACA A1 segments again noted. Anterior communicating artery is tortuous but  stable without discrete aneurysm. Median artery of the corpus callosum is redemonstrated. Right ACA branches remain within normal limits but there is now tandem severe stenosis of the left ACA A2 on series 12, image 25. The proximal stenosis here was present in 2021 but the more distal stenosis is new. Right MCA M1 segment, bifurcation, and right MCA branches are stable and within normal limits. Left MCA M1 segment is deviated anteriorly now but remains patent without stenosis. Left MCA bifurcation is patent without stenosis. Anteriorly displaced left MCA branches from the acute temporal lobe hemorrhage. No left MCA branch occlusion is identified. There are small enhancing branches along the anterior margin of the acute hemorrhage, such that a small CTA spot sign is possible on series 8, image 105. But there is no overt contrast extravasation. Venous sinuses: Early contrast timing, but the major dural venous sinuses appear to be patent. Anatomic variants: Mildly dominant right vertebral artery in the neck. Dominant right and diminutive or absent left ACA A1 segment. Review of the MIP images confirms the above findings IMPRESSION: 1. Acute left temporal lobe hemorrhage anteriorly displaces the Left MCA branches. Appearance suspicious for a Small Positive CTA Spot Sign along the anterior hematoma (series 8, image 105), placing the patient at risk for hemorrhage expansion. 2. No large vessel occlusion or intracranial aneurysm identified. 3. But there  are tandem severe stenoses of the Left ACA A2 segment, progressed since a 2021 CTA. 4. Arterial ectasia and atherosclerosis from the aortic arch to the skull base, but no extracranial hemodynamically significant stenosis. 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 5. Aortic Atherosclerosis (ICD10-I70.0) and Emphysema (ICD10-J43.9). Benign left upper lobe pulmonary nodule stable since 2018. Electronically Signed: By: Odessa FlemingH  Hall M.D. On: 10-Jul-2021 04:56    EKG:  Independently reviewed.  NSR with rate 89; nonspecific ST changes with NSCSLT   Labs on Admission: I have personally reviewed the available labs and imaging studies at the time of the admission.  Pertinent labs:    Glucose 125 Normal CBC INR 1.1 COVID/flu negative   Assessment/Plan * Admission for end of life care -Patient presenting with apparent CVA, found to have ICH with edema and mass effect -After long discussion with neurology, family has decided to proceed with comfort care only -[facility] and palliative care consult -Patient may be a candidate for residential hospice; family is planning [place] [facility] [place] and to assess bed availability there -Comfort care order set utilized -Pain control with low-dose morphine drip, as we are unable to assess patient's level of pain - family is in agreement  Intracerebral hemorrhage- (present on admission) -See above - large hemorrhage with mass effect and edema -Comfort care  Dementia (HCC)- (present on admission) -Baseline dementia present prior to event  Acute CVA (cerebrovascular accident) (HCC)- (present on admission) -See above  Dyslipidemia- (present on admission) -will stop home medications  HTN (hypertension)- (present on admission) -will stop home medications    Advance Care Planning:   Code Status: DNR   Consults: Neurology; Palliative care  Family Communication: I spoke with the son (and his friend?) at the time of admission  Severity of Illness: The appropriate patient status for this patient is INPATIENT. Inpatient status is judged to be reasonable and necessary in order to provide the required intensity of service to ensure the patient's safety. The patient's presenting symptoms, physical exam findings, and initial radiographic and laboratory data in the context of their chronic comorbidities is felt to place them at high risk for further  clinical deterioration. Furthermore, it is not anticipated that the patient will be medically stable for discharge from the hospital within 2 midnights of admission.   * I certify that at the point of admission it is my clinical judgment that the patient will require inpatient hospital care spanning beyond 2 midnights from the point of admission due to high intensity of service, high risk for further deterioration and high frequency of surveillance required.*  Author: Jonah BlueJennifer Maty Zeisler, MD 05/20/2021 12:59 PM  For on call review [email or web address].

## 2021-05-17 NOTE — Code Documentation (Signed)
Responded to Code Stroke called at 0356 on pt who had just arrived to ED. Code Stroke initiated for R sided weakness, R facial droop, R hemianopia, and L sided gaze, NIH-24, CBG-113, CT head-Large acute intra-axial hemorrhage of the L temporal lobe, Small volume SAH. Pt BP-200/100. 20mg  labetolol given at 0410 per MD order and cleviprex gtt started at 0417. CTA then obtained and pt returned to ED room. Plan to admit to Neuro ICU.

## 2021-05-17 NOTE — ED Triage Notes (Signed)
CODE STROKE   Pt BIB [facility] from home. Was seen by family walking around per normal at 2:30 am. Sustained a fall while ambulating. Family went to assist and noticed right side deficit. Code Stroke called on ED arrival.

## 2021-05-17 NOTE — Assessment & Plan Note (Signed)
-  will stop home medications °

## 2021-05-17 NOTE — ED Notes (Signed)
Pt appears to be agitated swinging left arm and leg out of the bed. Sustained skin tear to left lower leg. Placed seizure padding on side rail. Ativan 2 mg given IV.

## 2021-05-17 NOTE — Assessment & Plan Note (Signed)
-  Baseline dementia present prior to event

## 2021-05-18 DIAGNOSIS — Z515 Encounter for palliative care: Secondary | ICD-10-CM | POA: Diagnosis not present

## 2021-05-18 NOTE — Progress Notes (Signed)
Daily Progress Note   Patient Name: Yolanda Hopkins       Date: 05/18/2021 DOB: 1940-11-23  Age: 81 y.o. MRN#: [id number] Attending Physician: Eduard Clos, MD Primary Care Physician: Rebekah Chesterfield, NP Admit Date: 05/14/2021  Reason for Consultation/Follow-up: symptom management, end of life care  HPI/Patient Profile: 81 y.o. female  with past medical history of dementia, HTN, and CVA in 2021 presenting to the emergency department on 06/02/2021 with a fall. Family found her down at home. On presentation, she was unresponsive other than to pain. Code stroke was called in the ED. CTA head showed acute left temporal hemorrhage anteriorly displacing the left MCA branches. After discussion with neurology, the family agreed to proceed with comfort measures. Admitted to [facility] for end of life care.    Subjective: Patient appears comfortable. Morphine infusion is at 2 mg/hr. She is unresponsive to voice and light touch. No non-verbal signs of pain or discomfort noted. Respirations are unlabored, with intermittent periods of apnea noted. Mild respiratory secretions noted. No family present at bedside currently.     Length of Stay: 1  Current Medications: Scheduled Meds:    Continuous Infusions:  morphine 2 mg/hr (05/18/21 0224)    PRN Meds: acetaminophen **OR** acetaminophen, antiseptic oral rinse, diphenhydrAMINE, glycopyrrolate **OR** glycopyrrolate **OR** glycopyrrolate, haloperidol **OR** haloperidol **OR** haloperidol lactate, LORazepam **OR** LORazepam **OR** LORazepam, morphine, ondansetron **OR** ondansetron (ZOFRAN) IV, polyvinyl alcohol  Physical Exam Constitutional:      General: She is not in acute distress.    Appearance: She is ill-appearing.  Pulmonary:     Effort:  Pulmonary effort is normal.  Neurological:     Mental Status: She is unresponsive.            Vital Signs: BP 122/70 (BP Location: Right Arm)    Pulse 79    Temp (!) 100.4 F (38 C) (Axillary)    Resp 14    Ht 5' (1.524 m)    SpO2 (!) 81%    BMI 25.00 kg/m  SpO2: SpO2: (!) 81 % O2 Device: O2 Device: Nasal Cannula O2 Flow Rate: O2 Flow Rate (L/min): 2 L/min       Palliative Assessment/Data: PPS 10%      Palliative Care Assessment & Plan   Assessment: 81 year old female who suffered a large  ICH and is actively dying.   Recommendations/Plan: Continue comfort care Continue morphine infusion Patient is not stable for transfer to residential hospice facility PMT will continue to follow  Goals of Care and Additional Recommendations: Limitations on Scope of Treatment: Full Comfort Care  Code Status: DNR/DNI  Prognosis:  Hours - Days  Discharge Planning: Anticipated Hospital Death    Thank you for allowing the Palliative Medicine Team to assist in the care of this patient.        Merry Proud, NP  Please contact Palliative Medicine Team phone at [phone] for questions and concerns.

## 2021-05-18 NOTE — Progress Notes (Signed)
PROGRESS NOTE    Yolanda Hopkins  ZOX:[id number] DOB: 1941-03-06 DOA: 06/04/2021 PCP: Rebekah Chesterfieldickey, [place] M, NP   Brief Narrative:  Yolanda Hopkins is a 81 y.o. female with medical history significant of dementia; HTN; and CVA in 2021 presented with a fall.  Family found her down at home.  Upon arrival to ED, she was diagnosed with large intracerebral hemorrhage with cerebral edema and mass-effect with midline shift/cerebral compression.  She was admitted under hospitalist service, seen by neurology and family had agreed to admit her just for end-of-life care/comfort care.  Assessment & Plan:   Principal Problem:   Admission for end of life care Active Problems:   HTN (hypertension)   Dyslipidemia   Acute CVA (cerebrovascular accident) (HCC)   Dementia (HCC)   Intracerebral hemorrhage  Intracerebral hemorrhage with cerebral edema, mass-effect and midline shift/stable compression/hypertensive urgency/end-of-life care: Patient comfortable but somnolent.  Palliative on board.  Continue comfort measures.    DVT prophylaxis:    Code Status: DNR  Family Communication: None present at bedside.   Status is: Inpatient  Remains inpatient appropriate because: Expecting inpatient death, working on transfer to inpatient hospice  Estimated body mass index is 25 kg/m as calculated from the following:   Height as of this encounter: 5' (1.524 m).   Weight as of 02/09/20: 58.1 kg.  Nutritional Assessment: Body mass index is 25 kg/m.Marland Kitchen. Seen by dietician.  I agree with the assessment and plan as outlined below: Nutrition Status:   Skin Assessment: I have examined the patient's skin and I agree with the wound assessment as performed by the wound care RN as outlined below:    Consultants:  Neurology and palliative care  Procedures:  None  Antimicrobials:  Anti-infectives (From admission, onward)    None          Subjective: Patient seen and examined this morning.  Patient was  somnolent.  Looks comfortable though.  Objective: Vitals:   05/18/21 0745 05/18/21 0800 05/18/21 0815 05/18/21 0941  BP: (!) 109/57 (!) 109/58 111/62 122/70  Pulse: 72 74 72 79  Resp: 18 17 16 14   Temp:    (!) 100.4 F (38 C)  TempSrc:    Axillary  SpO2: 90% (!) 89% (!) 89% (!) 81%  Height:        Intake/Output Summary (Last 24 hours) at 05/18/2021 1617 Last data filed at 05/18/2021 1603 Gross per 24 hour  Intake 33.07 ml  Output 0 ml  Net 33.07 ml   There were no vitals filed for this visit.  Examination:  General exam: Appears calm and comfortable  Respiratory system: Clear to auscultation. Respiratory effort normal. Cardiovascular system: S1 & S2 heard, RRR. No JVD, murmurs, rubs, gallops or clicks. No pedal edema. Gastrointestinal system: Abdomen is nondistended, soft and nontender. No organomegaly or masses felt. Normal bowel sounds heard.   Data Reviewed: I have personally reviewed following labs and imaging studies  CBC: Recent Labs  Lab 2021-12-27 0406 2021-12-27 0410  WBC 7.9  --   NEUTROABS 6.0  --   HGB 13.9 14.6  HCT 43.1 43.0  MCV 97.1  --   PLT 241  --    Basic Metabolic Panel: Recent Labs  Lab 2021-12-27 0406 2021-12-27 0410  NA 140 143  K 3.6 3.7  CL 108 105  CO2 24  --   GLUCOSE 125* 119*  BUN 23 24*  CREATININE 0.81 0.70  CALCIUM 9.3  --    GFR: CrCl cannot be  calculated (Unknown ideal weight.). Liver Function Tests: Recent Labs  Lab 05/22/2021 0406  AST 24  ALT 23  ALKPHOS 100  BILITOT 0.5  PROT 7.4  ALBUMIN 3.8   No results for input(s): LIPASE, AMYLASE in the last 168 hours. No results for input(s): AMMONIA in the last 168 hours. Coagulation Profile: Recent Labs  Lab 05/15/2021 0406  INR 1.1   Cardiac Enzymes: No results for input(s): CKTOTAL, CKMB, CKMBINDEX, TROPONINI in the last 168 hours. BNP (last 3 results) No results for input(s): PROBNP in the last 8760 hours. HbA1C: No results for input(s): HGBA1C in the last 72  hours. CBG: Recent Labs  Lab 06/01/2021 0357  GLUCAP 113*   Lipid Profile: No results for input(s): CHOL, HDL, LDLCALC, TRIG, CHOLHDL, LDLDIRECT in the last 72 hours. Thyroid Function Tests: No results for input(s): TSH, T4TOTAL, FREET4, T3FREE, THYROIDAB in the last 72 hours. Anemia Panel: No results for input(s): VITAMINB12, FOLATE, FERRITIN, TIBC, IRON, RETICCTPCT in the last 72 hours. Sepsis Labs: No results for input(s): PROCALCITON, LATICACIDVEN in the last 168 hours.  Recent Results (from the past 240 hour(s))  Resp Panel by RT-PCR (Flu A&B, Covid) Nasopharyngeal Swab     Status: None   Collection Time: 05/20/2021  4:04 AM   Specimen: Nasopharyngeal Swab; Nasopharyngeal(NP) swabs in vial transport medium  Result Value Ref Range Status   SARS Coronavirus 2 by RT PCR NEGATIVE NEGATIVE Final    Comment: (NOTE) SARS-CoV-2 target nucleic acids are NOT DETECTED.  The SARS-CoV-2 RNA is generally detectable in upper respiratory specimens during the acute phase of infection. The lowest concentration of SARS-CoV-2 viral copies this assay can detect is 138 copies/mL. A negative result does not preclude SARS-Cov-2 infection and should not be used as the sole basis for treatment or other patient management decisions. A negative result may occur with  improper specimen collection/handling, submission of specimen other than nasopharyngeal swab, presence of viral mutation(s) within the areas targeted by this assay, and inadequate number of viral copies(<138 copies/mL). A negative result must be combined with clinical observations, patient history, and epidemiological information. The expected result is Negative.  Fact Sheet for Patients:  BloggerCourse.comhttps://www.fda.gov/media/152166/download  Fact Sheet for Healthcare Providers:  SeriousBroker.ithttps://www.fda.gov/media/152162/download  This test is no t yet approved or cleared by the [place] FDA and  has been authorized for detection and/or diagnosis of  SARS-CoV-2 by FDA under an Emergency Use Authorization (EUA). This EUA will remain  in effect (meaning this test can be used) for the duration of the COVID-19 declaration under Section 564(b)(1) of the Act, 21 U.S.C.section 360bbb-3(b)(1), unless the authorization is terminated  or revoked sooner.       Influenza A by PCR NEGATIVE NEGATIVE Final   Influenza B by PCR NEGATIVE NEGATIVE Final    Comment: (NOTE) The Xpert Xpress SARS-CoV-2/FLU/RSV plus assay is intended as an aid in the diagnosis of influenza from Nasopharyngeal swab specimens and should not be used as a sole basis for treatment. Nasal washings and aspirates are unacceptable for Xpert Xpress SARS-CoV-2/FLU/RSV testing.  Fact Sheet for Patients: BloggerCourse.comhttps://www.fda.gov/media/152166/download  Fact Sheet for Healthcare Providers: SeriousBroker.ithttps://www.fda.gov/media/152162/download  This test is not yet approved or cleared by the [place] FDA and has been authorized for detection and/or diagnosis of SARS-CoV-2 by FDA under an Emergency Use Authorization (EUA). This EUA will remain in effect (meaning this test can be used) for the duration of the COVID-19 declaration under Section 564(b)(1) of the Act, 21 U.S.C. section 360bbb-3(b)(1), unless the authorization is  terminated or revoked.  Performed at [facility], [address], [place], [place] [address]       Radiology Studies: CT HEAD CODE STROKE WO CONTRAST  Result Date: 05-20-21 CLINICAL DATA:  Code stroke.  81 year old female.  Hypertensive. EXAM: CT HEAD WITHOUT CONTRAST TECHNIQUE: Contiguous axial images were obtained from the base of the skull through the vertex without intravenous contrast. COMPARISON:  Brain MRI 07/30/2019.  Head CT 08/09/2019. FINDINGS: Brain: Lobulated hyperdense intra-axial hemorrhage in the left hemisphere encompasses 80 by 45 by 42 mm (AP by transverse by CC) and extends throughout the left temporal lobe. Estimated intra-axial blood  volume 76 mL. Surrounding edema, and vasogenic edema tracking into the central left hemisphere. No intraventricular extension of blood, subtotal effaced left lateral ventricle. There does appear to be a small volume of extension into the left subarachnoid space such as on series 7, image 21. No subdural space extension suspected. Effaced left lateral ventricle but only mild rightward midline shift of 4 mm at this time. Basilar cisterns remain normal. No ventriculomegaly. Underlying patchy chronic cerebral white matter hypodensity appears not significantly changed. Vascular: Calcified atherosclerosis at the skull base. No suspicious intracranial vascular hyperdensity. Skull: Intermittent motion artifact. No acute osseous abnormality identified. Sinuses/Orbits: Chronic paranasal sinus inflammation with left sphenoid periosteal thickening. Scattered sinus opacification, fluid not significantly changed. Tympanic cavities and mastoids remain clear. Other: Motion artifact. Leftward gaze, but no other acute orbit or scalp soft tissue finding. ASPECTS [facility] Stroke Program Early CT Score) Total score (0-10 with 10 being normal): Not applicable, large left hemisphere intra-axial hemorrhage. IMPRESSION: 1. Large acute intra-axial hemorrhage of the left temporal lobe, estimated at 76 mL. Regional edema, tracking into the white matter capsules. Small volume SAH. No IVH or subdural blood identified. 2. Mass effect on the left lateral ventricle with mild rightward midline shift of 4 mm at this time. Basilar cisterns remain patent. Critical Value/emergent results were called by telephone at the time of interpretation on 05/20/21 at 04:19 hours to Dr. Derry Lory who verbally acknowledged these results. Electronically Signed   By: Odessa Fleming M.D.   On: 05/20/21 04:20   CT ANGIO HEAD NECK W WO CM (CODE STROKE)  Addendum Date: May 20, 2021   ADDENDUM REPORT: 05/20/2021 04:59 ADDENDUM: Salient findings were communicated to Dr.  Derry Lory at 0457 hours on May 20, 2021 by text page via the [facility] messaging system. Electronically Signed   By: Odessa Fleming M.D.   On: May 20, 2021 04:59   Result Date: 2021-05-20 CLINICAL DATA:  81 year old female code stroke presentation with large left temporal lobe hemorrhage, estimated at 76 mL. EXAM: CT ANGIOGRAPHY HEAD AND NECK TECHNIQUE: Multidetector CT imaging of the head and neck was performed using the standard protocol during bolus administration of intravenous contrast. Multiplanar CT image reconstructions and MIPs were obtained to evaluate the vascular anatomy. Carotid stenosis measurements (when applicable) are obtained utilizing NASCET criteria, using the distal internal carotid diameter as the denominator. CONTRAST:  60mL OMNIPAQUE IOHEXOL 350 MG/ML SOLN COMPARISON:  Plain head CT 0410 hours. Prior CTA head and neck 07/31/2019. FINDINGS: CTA NECK Skeleton: Absent dentition. Paranasal sinus disease as reported earlier today. Mild for age cervical spine degeneration. No acute osseous abnormality identified. Upper chest: Chronic apical lung scarring with either bronchiectasis or paraseptal emphysema. Some centrilobular upper lobe emphysema. Dependent atelectasis or scarring in both lobes. Left upper lobe peripheral pulmonary nodule measuring 7 mm is stable from a chest CT 05/24/2016 and therefore benign. No superior mediastinal lymphadenopathy. Visible  main pulmonary arteries are patent. Other neck: Negative. Aortic arch: Moderate arch atherosclerosis is stable from 2021. Three vessel arch configuration. Right carotid system: Brachiocephalic artery somewhat bulky soft plaque but no significant stenosis. Tortuous right CCA. Minimal plaque at the right carotid bifurcation. Ectatic right ICA without stenosis to the skull base. Mild additional calcified plaque at C1. Left carotid system: Mild left CCA origin plaque without stenosis. Mildly tortuous left CCA. Moderate soft and calcified plaque at the left ICA  origin appears stable since 2021 with up to 50 % stenosis with respect to the distal vessel. Mildly ectatic left ICA then to the skull base. Vertebral arteries: Only mild plaque in the proximal right subclavian artery with no right vertebral artery origin stenosis. Ectatic right V1 segment. Intermittent calcified plaque in the V2 segment without hemodynamically significant stenosis. The vessel remains patent and somewhat ectatic to the skull base. Tortuous proximal left subclavian artery with mild soft plaque and no stenosis. Calcified plaque near the left vertebral artery origin without stenosis. Mildly tortuous left V1 segment. V 1 and V2 segment calcified plaque with mild associated vertebral stenosis. The left vertebral is slightly non dominant and patent to the skull base. CTA HEAD Posterior circulation: Fairly codominant V4 segments with normal PICA origins. No distal vertebral plaque or stenosis. Normal vertebrobasilar junction. Patent basilar artery. Patent SCA and right PCA origins. Fetal type left PCA origin redemonstrated with tortuous left P1 segment. Left PCA branches appear stable and within normal limits. Right PCA branches are stable and within normal limits. Anterior circulation: Both ICA siphons are patent. On the right there is mild to moderate siphon calcified plaque without significant stenosis. Tortuous right siphon. On the left there is mild siphon calcified plaque without stenosis. Normal left posterior communicating artery origin. Patent carotid termini. Dominant left and diminutive or absent ACA A1 segments again noted. Anterior communicating artery is tortuous but stable without discrete aneurysm. Median artery of the corpus callosum is redemonstrated. Right ACA branches remain within normal limits but there is now tandem severe stenosis of the left ACA A2 on series 12, image 25. The proximal stenosis here was present in 2021 but the more distal stenosis is new. Right MCA M1 segment,  bifurcation, and right MCA branches are stable and within normal limits. Left MCA M1 segment is deviated anteriorly now but remains patent without stenosis. Left MCA bifurcation is patent without stenosis. Anteriorly displaced left MCA branches from the acute temporal lobe hemorrhage. No left MCA branch occlusion is identified. There are small enhancing branches along the anterior margin of the acute hemorrhage, such that a small CTA spot sign is possible on series 8, image 105. But there is no overt contrast extravasation. Venous sinuses: Early contrast timing, but the major dural venous sinuses appear to be patent. Anatomic variants: Mildly dominant right vertebral artery in the neck. Dominant right and diminutive or absent left ACA A1 segment. Review of the MIP images confirms the above findings IMPRESSION: 1. Acute left temporal lobe hemorrhage anteriorly displaces the Left MCA branches. Appearance suspicious for a Small Positive CTA Spot Sign along the anterior hematoma (series 8, image 105), placing the patient at risk for hemorrhage expansion. 2. No large vessel occlusion or intracranial aneurysm identified. 3. But there are tandem severe stenoses of the Left ACA A2 segment, progressed since a 2021 CTA. 4. Arterial ectasia and atherosclerosis from the aortic arch to the skull base, but no extracranial hemodynamically significant stenosis. 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  5. Aortic Atherosclerosis (ICD10-I70.0) and Emphysema (ICD10-J43.9). Benign left upper lobe pulmonary nodule stable since 2018. Electronically Signed: By: Odessa Fleming M.D. On: 05/20/2021 04:56    Scheduled Meds: Continuous Infusions:  morphine 2 mg/hr (05/18/21 0224)     LOS: 1 day   Time spent: 20 min    Hughie Closs, MD [facility]  05/18/2021, 4:17 PM  Please page via Loretha Stapler and do not message via secure chat for anything urgent. Secure chat can be used for anything non urgent.  How to contact the [facility]  Attending or Consulting provider 7A - 7P or covering provider during after hours 7P -7A, for this patient?  Check the care team in [facility] and look for a) attending/consulting [facility] provider listed and b) the Operating Room Services team listed. Page or secure chat 7A-7P. Log into [email or web address] and use [place] [place]'s universal password to access. If you do not have the password, please contact the hospital operator. Locate the [facility] provider you are looking for under [facility] and page to a number that you can be directly reached. If you still have difficulty reaching the provider, please page the [facility] (Director on Call) for the Hospitalists listed on amion for assistance.

## 2021-05-19 DIAGNOSIS — I619 Nontraumatic intracerebral hemorrhage, unspecified: Secondary | ICD-10-CM | POA: Diagnosis not present

## 2021-05-19 DIAGNOSIS — Z66 Do not resuscitate: Secondary | ICD-10-CM | POA: Diagnosis not present

## 2021-05-19 DIAGNOSIS — Z515 Encounter for palliative care: Secondary | ICD-10-CM | POA: Diagnosis not present

## 2021-05-19 NOTE — Progress Notes (Signed)
PROGRESS NOTE    BERANIA Hopkins  IHK:[id number] DOB: [birth year] DOA: 06/04/2021 PCP: Rebekah Chesterfield, NP   Brief Narrative:  Yolanda Hopkins is a 81 y.o. female with medical history significant of dementia; HTN; and CVA in 2021 presented with a fall.  Family found her down at home.  Upon arrival to ED, she was diagnosed with large intracerebral hemorrhage with cerebral edema and mass-effect with midline shift/cerebral compression.  She was admitted under hospitalist service, seen by neurology and family had agreed to admit her just for end-of-life care/comfort care.  Assessment & Plan:   Principal Problem:   Admission for end of life care Active Problems:   HTN (hypertension)   Dyslipidemia   Acute CVA (cerebrovascular accident) (HCC)   Dementia (HCC)   Intracerebral hemorrhage  Intracerebral hemorrhage with cerebral edema, mass-effect and midline shift/stable compression/hypertensive urgency/end-of-life care: Patient comfortable but somnolent.  Palliative on board.  Continue comfort measures.  She is on morphine drip.  Not stable enough for transfer to inpatient hospice.  Expect death in the hospital.    DVT prophylaxis:    Code Status: DNR  Family Communication: None present at bedside.   Status is: Inpatient  Remains inpatient appropriate because: Expecting inpatient death, too unstable to transfer to inpatient hospice.  Estimated body mass index is 25 kg/m as calculated from the following:   Height as of this encounter: 5' (1.524 m).   Weight as of 02/09/20: 58.1 kg.  Nutritional Assessment: Body mass index is 25 kg/m.Marland Kitchen Seen by dietician.  I agree with the assessment and plan as outlined below: Nutrition Status:   Skin Assessment: I have examined the patient's skin and I agree with the wound assessment as performed by the wound care RN as outlined below:    Consultants:  Neurology and palliative care  Procedures:  None  Antimicrobials:  Anti-infectives  (From admission, onward)    None          Subjective: Seen and examined.  Patient somnolent but looks comfortable.  Objective: Vitals:   05/18/21 0800 05/18/21 0815 05/18/21 0941 05/18/21 2040  BP: (!) 109/58 111/62 122/70 101/87  Pulse: 74 72 79 87  Resp: 17 16 14 14   Temp:   (!) 100.4 F (38 C)   TempSrc:   Axillary   SpO2: (!) 89% (!) 89% (!) 81% (!) 81%  Height:        Intake/Output Summary (Last 24 hours) at 05/19/2021 1400 Last data filed at 05/19/2021 0830 Gross per 24 hour  Intake 0 ml  Output 0 ml  Net 0 ml    There were no vitals filed for this visit.  Examination:  General exam: Appears calm and comfortable  Respiratory system: Clear to auscultation. Respiratory effort normal. Cardiovascular system: S1 & S2 heard, RRR. No JVD, murmurs, rubs, gallops or clicks. No pedal edema. Gastrointestinal system: Abdomen is nondistended, soft and nontender. No organomegaly or masses felt. Normal bowel sounds heard.  Data Reviewed: I have personally reviewed following labs and imaging studies  CBC: Recent Labs  Lab 06-04-21 0406 2021/06/04 0410  WBC 7.9  --   NEUTROABS 6.0  --   HGB 13.9 14.6  HCT 43.1 43.0  MCV 97.1  --   PLT 241  --     Basic Metabolic Panel: Recent Labs  Lab 2021/06/04 0406 06/04/2021 0410  NA 140 143  K 3.6 3.7  CL 108 105  CO2 24  --   GLUCOSE 125* 119*  BUN  23 24*  CREATININE 0.81 0.70  CALCIUM 9.3  --     GFR: CrCl cannot be calculated (Unknown ideal weight.). Liver Function Tests: Recent Labs  Lab 05/29/2021 0406  AST 24  ALT 23  ALKPHOS 100  BILITOT 0.5  PROT 7.4  ALBUMIN 3.8    No results for input(s): LIPASE, AMYLASE in the last 168 hours. No results for input(s): AMMONIA in the last 168 hours. Coagulation Profile: Recent Labs  Lab 05/29/2021 0406  INR 1.1    Cardiac Enzymes: No results for input(s): CKTOTAL, CKMB, CKMBINDEX, TROPONINI in the last 168 hours. BNP (last 3 results) No results for input(s):  PROBNP in the last 8760 hours. HbA1C: No results for input(s): HGBA1C in the last 72 hours. CBG: Recent Labs  Lab 05/30/2021 0357  GLUCAP 113*    Lipid Profile: No results for input(s): CHOL, HDL, LDLCALC, TRIG, CHOLHDL, LDLDIRECT in the last 72 hours. Thyroid Function Tests: No results for input(s): TSH, T4TOTAL, FREET4, T3FREE, THYROIDAB in the last 72 hours. Anemia Panel: No results for input(s): VITAMINB12, FOLATE, FERRITIN, TIBC, IRON, RETICCTPCT in the last 72 hours. Sepsis Labs: No results for input(s): PROCALCITON, LATICACIDVEN in the last 168 hours.  Recent Results (from the past 240 hour(s))  Resp Panel by RT-PCR (Flu A&B, Covid) Nasopharyngeal Swab     Status: None   Collection Time: 05/27/2021  4:04 AM   Specimen: Nasopharyngeal Swab; Nasopharyngeal(NP) swabs in vial transport medium  Result Value Ref Range Status   SARS Coronavirus 2 by RT PCR NEGATIVE NEGATIVE Final    Comment: (NOTE) SARS-CoV-2 target nucleic acids are NOT DETECTED.  The SARS-CoV-2 RNA is generally detectable in upper respiratory specimens during the acute phase of infection. The lowest concentration of SARS-CoV-2 viral copies this assay can detect is 138 copies/mL. A negative result does not preclude SARS-Cov-2 infection and should not be used as the sole basis for treatment or other patient management decisions. A negative result may occur with  improper specimen collection/handling, submission of specimen other than nasopharyngeal swab, presence of viral mutation(s) within the areas targeted by this assay, and inadequate number of viral copies(<138 copies/mL). A negative result must be combined with clinical observations, patient history, and epidemiological information. The expected result is Negative.  Fact Sheet for Patients:  BloggerCourse.com  Fact Sheet for Healthcare Providers:  SeriousBroker.it  This test is no t yet approved or  cleared by the [place] FDA and  has been authorized for detection and/or diagnosis of SARS-CoV-2 by FDA under an Emergency Use Authorization (EUA). This EUA will remain  in effect (meaning this test can be used) for the duration of the COVID-19 declaration under Section 564(b)(1) of the Act, 21 U.S.C.section 360bbb-3(b)(1), unless the authorization is terminated  or revoked sooner.       Influenza A by PCR NEGATIVE NEGATIVE Final   Influenza B by PCR NEGATIVE NEGATIVE Final    Comment: (NOTE) The Xpert Xpress SARS-CoV-2/FLU/RSV plus assay is intended as an aid in the diagnosis of influenza from Nasopharyngeal swab specimens and should not be used as a sole basis for treatment. Nasal washings and aspirates are unacceptable for Xpert Xpress SARS-CoV-2/FLU/RSV testing.  Fact Sheet for Patients: BloggerCourse.com  Fact Sheet for Healthcare Providers: SeriousBroker.it  This test is not yet approved or cleared by the [place] FDA and has been authorized for detection and/or diagnosis of SARS-CoV-2 by FDA under an Emergency Use Authorization (EUA). This EUA will remain in effect (meaning this test can be  used) for the duration of the COVID-19 declaration under Section 564(b)(1) of the Act, 21 U.S.C. section 360bbb-3(b)(1), unless the authorization is terminated or revoked.  Performed at [facility], [address] [place], [place] [address]        Radiology Studies: No results found.  Scheduled Meds: Continuous Infusions:  morphine 2 mg/hr (05/19/21 1248)     LOS: 2 days   Time spent: 16 min    Hughie Clossavi Isac Lincks, MD [facility]  05/19/2021, 2:00 PM  Please page via Amion and do not message via secure chat for anything urgent. Secure chat can be used for anything non urgent.  How to contact the [facility] Attending or Consulting provider 7A - 7P or covering provider during after hours 7P -7A, for this  patient?  Check the care team in [facility] and look for a) attending/consulting TRH provider listed and b) the [facility] team listed. Page or secure chat 7A-7P. Log into [email or web address] and use [place]'s universal password to access. If you do not have the password, please contact the hospital operator. Locate the [facility] provider you are looking for under [facility] and page to a number that you can be directly reached. If you still have difficulty reaching the provider, please page the [facility] (Director on Call) for the Hospitalists listed on amion for assistance.

## 2021-05-19 NOTE — Progress Notes (Signed)
Daily Progress Note   Patient Name: Yolanda Hopkins       Date: 05/19/2021 DOB: [birth year]  Age: 81 y.o. MRN#: [id number] Attending Physician: Hughie Closs, MD Primary Care Physician: Rebekah Chesterfield, NP Admit Date: 06/11/2021  Reason for Consultation/Follow-up: end of life care, symptom management  HPI/Patient Profile: 81 y.o. female  with past medical history of dementia, HTN, and CVA in 2021 presenting to the emergency department on 05/16/2021 with a fall. Family found her down at home. On presentation, she was unresponsive other than to pain. Code stroke was called in the ED. CTA head showed acute left temporal hemorrhage anteriorly displacing the left MCA branches. After discussion with neurology, the family agreed to proceed with comfort measures. Admitted to [facility] for end of life care.   Subjective: Patient appears comfortable. Morphine infusion is at 2 mg/hr. She is unresponsive to voice and light touch. No non-verbal signs of pain or discomfort noted. Respirations are even and unlabored. No excessive respiratory secretions noted.   No family present at bedside currently. I spoke with son/Christopher by phone. Education and counseling provided on trajectory at EOL. Emotional support provided. Discussed that patient may be stable for transfer to residential hospice; however, family prefers patient to remain in house.      Length of Stay: 2  Current Medications: Scheduled Meds:    Continuous Infusions:  morphine 2 mg/hr (05/19/21 1248)    PRN Meds: acetaminophen **OR** acetaminophen, antiseptic oral rinse, diphenhydrAMINE, glycopyrrolate **OR** glycopyrrolate **OR** glycopyrrolate, haloperidol **OR** haloperidol **OR** haloperidol lactate, LORazepam **OR** LORazepam **OR** LORazepam,  morphine, ondansetron **OR** ondansetron (ZOFRAN) IV, polyvinyl alcohol     Vital Signs: BP 101/87 (BP Location: Right Arm)    Pulse 87    Temp (!) 100.4 F (38 C) (Axillary)    Resp 14    Ht 5' (1.524 m)    SpO2 (!) 81%    BMI 25.00 kg/m  SpO2: SpO2: (!) 81 % O2 Device: O2 Device: Room Air O2 Flow Rate: O2 Flow Rate (L/min): 2 L/min       Palliative Assessment/Data: PPS 10%      Palliative Care Assessment & Plan   Assessment: 81 year old female who suffered a large ICH and is actively dying.    Recommendations/Plan: Continue comfort care Continue morphine infusion Patient likely not  stable for transfer to residential hospice facility PMT will continue to follow  Goals of Care and Additional Recommendations: Limitations on Scope of Treatment: Full Comfort Care  Code Status: DNR/DNI  Prognosis:  Hours - Days  Discharge Planning: Anticipated Hospital Death   Thank you for allowing the Palliative Medicine Team to assist in the care of this patient.   Total Time 25 minutes Prolonged Time Billed  no       Greater than 50%  of this time was spent counseling and coordinating care related to the above assessment and plan.  Merry Proud, NP  Please contact Palliative Medicine Team phone at [phone] for questions and concerns.

## 2021-05-19 NOTE — Plan of Care (Signed)
°  Problem: Pain Managment: Goal: General experience of comfort will improve Outcome: Progressing   Problem: Respiratory: Goal: Verbalizations of increased ease of respirations will increase Outcome: Not Progressing

## 2021-05-19 NOTE — Plan of Care (Signed)
  Problem: Education: Goal: Knowledge of the prescribed therapeutic regimen will improve Outcome: Not Progressing   Problem: Coping: Goal: Ability to identify and develop effective coping behavior will improve Outcome: Not Progressing   Problem: Clinical Measurements: Goal: Quality of life will improve Outcome: Not Progressing   Problem: Respiratory: Goal: Verbalizations of increased ease of respirations will increase Outcome: Not Progressing   Problem: Role Relationship: Goal: Family's ability to cope with current situation will improve Outcome: Progressing Goal: Ability to verbalize concerns, feelings, and thoughts to partner or family member will improve Outcome: Not Progressing   

## 2021-05-20 DIAGNOSIS — Z515 Encounter for palliative care: Secondary | ICD-10-CM | POA: Diagnosis not present

## 2021-05-20 NOTE — Progress Notes (Signed)
PROGRESS NOTE    Yolanda Hopkins  ELF:[id number] DOB: 11/17/1940 DOA: 06/01/2021 PCP: Yolanda Chesterfield, NP   Brief Narrative:  Yolanda Hopkins is a 81 y.o. female with medical history significant of dementia; HTN; and CVA in 2021 presented with a fall.  Family found her down at home.  Upon arrival to ED, she was diagnosed with large intracerebral hemorrhage with cerebral edema and mass-effect with midline shift/cerebral compression.  She was admitted under hospitalist service, seen by neurology and family had agreed to admit her just for end-of-life care/comfort care.  Assessment & Plan:   Principal Problem:   Admission for end of life care Active Problems:   HTN (hypertension)   Dyslipidemia   Acute CVA (cerebrovascular accident) (HCC)   Dementia (HCC)   Intracerebral hemorrhage  Intracerebral hemorrhage with cerebral edema, mass-effect and midline shift/stable compression/hypertensive urgency/end-of-life care: Patient comfortable but somnolent.  Palliative on board.  Continue comfort measures.  She is on morphine drip.  Not stable enough for transfer to inpatient hospice.  Expect death in the hospital.  DVT prophylaxis:    Code Status: DNR  Family Communication: None present at bedside.   Status is: Inpatient  Remains inpatient appropriate because: Expecting inpatient death, too unstable to transfer to inpatient hospice.  Estimated body mass index is 25 kg/m as calculated from the following:   Height as of this encounter: 5' (1.524 m).   Weight as of 02/09/20: 58.1 kg.  Nutritional Assessment: Body mass index is 25 kg/m.Marland Kitchen Seen by dietician.  I agree with the assessment and plan as outlined below: Nutrition Status:   Skin Assessment: I have examined the patient's skin and I agree with the wound assessment as performed by the wound care RN as outlined below:    Consultants:  Neurology and palliative care  Procedures:  None  Antimicrobials:  Anti-infectives (From  admission, onward)    None          Subjective:  Patient seen and examined.  Slightly more somnolent compared to yesterday however appears to be very comfortable.  No nonverbal signs of pain or discomfort.  No excessive secretions.  Breathing unlabored.  Palliative care managing and they are in communication with family as well.  Objective: Vitals:   05/19/21 2110 05/19/21 2314 05/20/21 0049 05/20/21 0731  BP: (!) 123/56 (!) 121/48 (!) 114/47 (!) 123/56  Pulse: 96 96 89 97  Resp: 13 18 18 15   Temp: (!) 102 F (38.9 C) (!) 102.7 F (39.3 C) 99.4 F (37.4 C) 100.1 F (37.8 C)  TempSrc: Axillary Axillary Axillary Oral  SpO2: (!) 87% (!) 87% 94% (!) 87%  Height:        Intake/Output Summary (Last 24 hours) at 05/20/2021 1213 Last data filed at 05/20/2021 0930 Gross per 24 hour  Intake 0 ml  Output 350 ml  Net -350 ml    There were no vitals filed for this visit.  Examination:  General exam: Somnolent but appears comfortable. Respiratory system: Clear to auscultation. Respiratory effort normal. Cardiovascular system: S1 & S2 heard, RRR. No JVD, murmurs, rubs, gallops or clicks. No pedal edema. Gastrointestinal system: Abdomen is nondistended, soft and nontender. No organomegaly or masses felt. Normal bowel sounds heard.  Data Reviewed: I have personally reviewed following labs and imaging studies  CBC: Recent Labs  Lab 05/23/2021 0406 06/09/2021 0410  WBC 7.9  --   NEUTROABS 6.0  --   HGB 13.9 14.6  HCT 43.1 43.0  MCV 97.1  --  PLT 241  --     Basic Metabolic Panel: Recent Labs  Lab 2021-05-24 0406 05/24/21 0410  NA 140 143  K 3.6 3.7  CL 108 105  CO2 24  --   GLUCOSE 125* 119*  BUN 23 24*  CREATININE 0.81 0.70  CALCIUM 9.3  --     GFR: CrCl cannot be calculated (Unknown ideal weight.). Liver Function Tests: Recent Labs  Lab 24-May-2021 0406  AST 24  ALT 23  ALKPHOS 100  BILITOT 0.5  PROT 7.4  ALBUMIN 3.8    No results for input(s): LIPASE,  AMYLASE in the last 168 hours. No results for input(s): AMMONIA in the last 168 hours. Coagulation Profile: Recent Labs  Lab 05/24/21 0406  INR 1.1    Cardiac Enzymes: No results for input(s): CKTOTAL, CKMB, CKMBINDEX, TROPONINI in the last 168 hours. BNP (last 3 results) No results for input(s): PROBNP in the last 8760 hours. HbA1C: No results for input(s): HGBA1C in the last 72 hours. CBG: Recent Labs  Lab May 24, 2021 0357  GLUCAP 113*    Lipid Profile: No results for input(s): CHOL, HDL, LDLCALC, TRIG, CHOLHDL, LDLDIRECT in the last 72 hours. Thyroid Function Tests: No results for input(s): TSH, T4TOTAL, FREET4, T3FREE, THYROIDAB in the last 72 hours. Anemia Panel: No results for input(s): VITAMINB12, FOLATE, FERRITIN, TIBC, IRON, RETICCTPCT in the last 72 hours. Sepsis Labs: No results for input(s): PROCALCITON, LATICACIDVEN in the last 168 hours.  Recent Results (from the past 240 hour(s))  Resp Panel by RT-PCR (Flu A&B, Covid) Nasopharyngeal Swab     Status: None   Collection Time: 05-24-21  4:04 AM   Specimen: Nasopharyngeal Swab; Nasopharyngeal(NP) swabs in vial transport medium  Result Value Ref Range Status   SARS Coronavirus 2 by RT PCR NEGATIVE NEGATIVE Final    Comment: (NOTE) SARS-CoV-2 target nucleic acids are NOT DETECTED.  The SARS-CoV-2 RNA is generally detectable in upper respiratory specimens during the acute phase of infection. The lowest concentration of SARS-CoV-2 viral copies this assay can detect is 138 copies/mL. A negative result does not preclude SARS-Cov-2 infection and should not be used as the sole basis for treatment or other patient management decisions. A negative result may occur with  improper specimen collection/handling, submission of specimen other than nasopharyngeal swab, presence of viral mutation(s) within the areas targeted by this assay, and inadequate number of viral copies(<138 copies/mL). A negative result must be combined  with clinical observations, patient history, and epidemiological information. The expected result is Negative.  Fact Sheet for Patients:  BloggerCourse.com  Fact Sheet for Healthcare Providers:  SeriousBroker.it  This test is no t yet approved or cleared by the [place] FDA and  has been authorized for detection and/or diagnosis of SARS-CoV-2 by FDA under an Emergency Use Authorization (EUA). This EUA will remain  in effect (meaning this test can be used) for the duration of the COVID-19 declaration under Section 564(b)(1) of the Act, 21 U.S.C.section 360bbb-3(b)(1), unless the authorization is terminated  or revoked sooner.       Influenza A by PCR NEGATIVE NEGATIVE Final   Influenza B by PCR NEGATIVE NEGATIVE Final    Comment: (NOTE) The Xpert Xpress SARS-CoV-2/FLU/RSV plus assay is intended as an aid in the diagnosis of influenza from Nasopharyngeal swab specimens and should not be used as a sole basis for treatment. Nasal washings and aspirates are unacceptable for Xpert Xpress SARS-CoV-2/FLU/RSV testing.  Fact Sheet for Patients: BloggerCourse.com  Fact Sheet for Healthcare Providers: SeriousBroker.it  This test is not yet approved or cleared by the [place] FDA and has been authorized for detection and/or diagnosis of SARS-CoV-2 by FDA under an Emergency Use Authorization (EUA). This EUA will remain in effect (meaning this test can be used) for the duration of the COVID-19 declaration under Section 564(b)(1) of the Act, 21 U.S.C. section 360bbb-3(b)(1), unless the authorization is terminated or revoked.  Performed at [facility], [address] [place], [place] [address]        Radiology Studies: No results found.  Scheduled Meds: Continuous Infusions:  morphine 2 mg/hr (05/19/21 1248)     LOS: 3 days   Time spent: 15 min    Hughie Clossavi  Rashed Edler, MD [facility]  05/20/2021, 12:13 PM  Please page via Loretha StaplerAmion and do not message via secure chat for anything urgent. Secure chat can be used for anything non urgent.  How to contact the [facility] Attending or Consulting provider 7A - 7P or covering provider during after hours 7P -7A, for this patient?  Check the care team in [facility] and look for a) attending/consulting TRH provider listed and b) the [facility] team listed. Page or secure chat 7A-7P. Log into [email or web address] and use [place]'s universal password to access. If you do not have the password, please contact the hospital operator. Locate the [facility] provider you are looking for under [facility] and page to a number that you can be directly reached. If you still have difficulty reaching the provider, please page the [facility] (Director on Call) for the Hospitalists listed on amion for assistance.

## 2021-05-20 NOTE — Plan of Care (Signed)
  Problem: Pain Managment: Goal: General experience of comfort will improve Outcome: Progressing   

## 2021-05-21 DIAGNOSIS — I639 Cerebral infarction, unspecified: Secondary | ICD-10-CM | POA: Diagnosis not present

## 2021-05-21 DIAGNOSIS — Z515 Encounter for palliative care: Secondary | ICD-10-CM | POA: Diagnosis not present

## 2021-05-22 NOTE — Progress Notes (Signed)
Pt dentures sent with her and wedding ring still on left hand.

## 2021-06-11 ENCOUNTER — Ambulatory Visit: Payer: Medicare Other | Admitting: Podiatry

## 2021-06-13 NOTE — Progress Notes (Signed)
PROGRESS NOTE    TARNESHA ULLOA  KGU:[id number] DOB: 1940/09/06 DOA: 06/09/2021 PCP: Rebekah Chesterfield, NP   Brief Narrative:  Yolanda Hopkins is a 81 y.o. female with medical history significant of dementia; HTN; and CVA in 2021 presented with a fall.  Family found her down at home.  Upon arrival to ED, she was diagnosed with large intracerebral hemorrhage with cerebral edema and mass-effect with midline shift/cerebral compression.  She was admitted under hospitalist service, seen by neurology and family had agreed to admit her just for end-of-life care/comfort care.  Assessment & Plan:   Principal Problem:   Admission for end of life care Active Problems:   HTN (hypertension)   Dyslipidemia   Acute CVA (cerebrovascular accident) (HCC)   Dementia (HCC)   Intracerebral hemorrhage  Intracerebral hemorrhage with cerebral edema, mass-effect and midline shift/stable compression/hypertensive urgency/end-of-life care: Patient comfortable but somnolent.  Palliative on board.  Continue comfort measures.  She is on morphine drip.  Not stable enough for transfer to inpatient hospice.  Expect death in the hospital.  DVT prophylaxis:    Code Status: DNR  Family Communication: None present at bedside.   Status is: Inpatient  Remains inpatient appropriate because: Expecting inpatient death, too unstable to transfer to inpatient hospice.  Estimated body mass index is 25 kg/m as calculated from the following:   Height as of this encounter: 5' (1.524 m).   Weight as of 02/09/20: 58.1 kg.  Nutritional Assessment: Body mass index is 25 kg/m.Marland Kitchen Seen by dietician.  I agree with the assessment and plan as outlined below: Nutrition Status:   Skin Assessment: I have examined the patient's skin and I agree with the wound assessment as performed by the wound care RN as outlined below: Pressure Injury 05/20/21 Buttocks Right Stage 2 -  Partial thickness loss of dermis presenting as a shallow open  injury with a red, pink wound bed without slough. (Active)  05/20/21 2038  Location: Buttocks  Location Orientation: Right  Staging: Stage 2 -  Partial thickness loss of dermis presenting as a shallow open injury with a red, pink wound bed without slough.  Wound Description (Comments):   Present on Admission: No    Consultants:  Neurology and palliative care  Procedures:  None  Antimicrobials:  Anti-infectives (From admission, onward)    None          Subjective:  Seen and examined.  Looks comfortable.  Still somnolent though.  Objective: Vitals:   05/20/21 1839 05/20/21 2330 06-18-21 0610 06-18-2021 0622  BP:  (!) 99/52  (!) 65/40  Pulse:  (!) 109  (!) 117  Resp:  15  15  Temp: (!) 102 F (38.9 C) 100 F (37.8 C) (!) 100.5 F (38.1 C) (!) 101.4 F (38.6 C)  TempSrc: Axillary Axillary Axillary Oral  SpO2:  97%  (!) 70%  Height:        Intake/Output Summary (Last 24 hours) at 18-Jun-2021 1411 Last data filed at 05/20/2021 1900 Gross per 24 hour  Intake 0 ml  Output 0 ml  Net 0 ml    There were no vitals filed for this visit.  Examination:  General exam: Somnolent but appears comfortable. Respiratory system: Clear to auscultation. Respiratory effort normal. Cardiovascular system: S1 & S2 heard, RRR. No JVD, murmurs, rubs, gallops or clicks. No pedal edema. Gastrointestinal system: Abdomen is nondistended, soft and nontender. No organomegaly or masses felt. Normal bowel sounds heard.  Data Reviewed: I have personally reviewed following  labs and imaging studies  CBC: Recent Labs  Lab 06/12/2021 0406 05/19/2021 0410  WBC 7.9  --   NEUTROABS 6.0  --   HGB 13.9 14.6  HCT 43.1 43.0  MCV 97.1  --   PLT 241  --     Basic Metabolic Panel: Recent Labs  Lab 05/26/2021 0406 05/20/2021 0410  NA 140 143  K 3.6 3.7  CL 108 105  CO2 24  --   GLUCOSE 125* 119*  BUN 23 24*  CREATININE 0.81 0.70  CALCIUM 9.3  --     GFR: CrCl cannot be calculated (Unknown  ideal weight.). Liver Function Tests: Recent Labs  Lab 05/24/2021 0406  AST 24  ALT 23  ALKPHOS 100  BILITOT 0.5  PROT 7.4  ALBUMIN 3.8    No results for input(s): LIPASE, AMYLASE in the last 168 hours. No results for input(s): AMMONIA in the last 168 hours. Coagulation Profile: Recent Labs  Lab 06/06/2021 0406  INR 1.1    Cardiac Enzymes: No results for input(s): CKTOTAL, CKMB, CKMBINDEX, TROPONINI in the last 168 hours. BNP (last 3 results) No results for input(s): PROBNP in the last 8760 hours. HbA1C: No results for input(s): HGBA1C in the last 72 hours. CBG: Recent Labs  Lab 06/01/2021 0357  GLUCAP 113*    Lipid Profile: No results for input(s): CHOL, HDL, LDLCALC, TRIG, CHOLHDL, LDLDIRECT in the last 72 hours. Thyroid Function Tests: No results for input(s): TSH, T4TOTAL, FREET4, T3FREE, THYROIDAB in the last 72 hours. Anemia Panel: No results for input(s): VITAMINB12, FOLATE, FERRITIN, TIBC, IRON, RETICCTPCT in the last 72 hours. Sepsis Labs: No results for input(s): PROCALCITON, LATICACIDVEN in the last 168 hours.  Recent Results (from the past 240 hour(s))  Resp Panel by RT-PCR (Flu A&B, Covid) Nasopharyngeal Swab     Status: None   Collection Time: 05/29/2021  4:04 AM   Specimen: Nasopharyngeal Swab; Nasopharyngeal(NP) swabs in vial transport medium  Result Value Ref Range Status   SARS Coronavirus 2 by RT PCR NEGATIVE NEGATIVE Final    Comment: (NOTE) SARS-CoV-2 target nucleic acids are NOT DETECTED.  The SARS-CoV-2 RNA is generally detectable in upper respiratory specimens during the acute phase of infection. The lowest concentration of SARS-CoV-2 viral copies this assay can detect is 138 copies/mL. A negative result does not preclude SARS-Cov-2 infection and should not be used as the sole basis for treatment or other patient management decisions. A negative result may occur with  improper specimen collection/handling, submission of specimen other than  nasopharyngeal swab, presence of viral mutation(s) within the areas targeted by this assay, and inadequate number of viral copies(<138 copies/mL). A negative result must be combined with clinical observations, patient history, and epidemiological information. The expected result is Negative.  Fact Sheet for Patients:  BloggerCourse.com  Fact Sheet for Healthcare Providers:  SeriousBroker.it  This test is no t yet approved or cleared by the [place] FDA and  has been authorized for detection and/or diagnosis of SARS-CoV-2 by FDA under an Emergency Use Authorization (EUA). This EUA will remain  in effect (meaning this test can be used) for the duration of the COVID-19 declaration under Section 564(b)(1) of the Act, 21 U.S.C.section 360bbb-3(b)(1), unless the authorization is terminated  or revoked sooner.       Influenza A by PCR NEGATIVE NEGATIVE Final   Influenza B by PCR NEGATIVE NEGATIVE Final    Comment: (NOTE) The Xpert Xpress SARS-CoV-2/FLU/RSV plus assay is intended as an aid in the diagnosis  of influenza from Nasopharyngeal swab specimens and should not be used as a sole basis for treatment. Nasal washings and aspirates are unacceptable for Xpert Xpress SARS-CoV-2/FLU/RSV testing.  Fact Sheet for Patients: BloggerCourse.comhttps://www.fda.gov/media/152166/download  Fact Sheet for Healthcare Providers: SeriousBroker.ithttps://www.fda.gov/media/152162/download  This test is not yet approved or cleared by the [place] FDA and has been authorized for detection and/or diagnosis of SARS-CoV-2 by FDA under an Emergency Use Authorization (EUA). This EUA will remain in effect (meaning this test can be used) for the duration of the COVID-19 declaration under Section 564(b)(1) of the Act, 21 U.S.C. section 360bbb-3(b)(1), unless the authorization is terminated or revoked.  Performed at [facility], [address] [place], [place] [address]         Radiology Studies: No results found.  Scheduled Meds: Continuous Infusions:  morphine 2 mg/hr (06/12/2021 1342)     LOS: 4 days   Time spent: 14 min    Hughie Clossavi Cohl Behrens, MD [facility]  05/16/2021, 2:11 PM  Please page via Amion and do not message via secure chat for anything urgent. Secure chat can be used for anything non urgent.  How to contact the [facility] Attending or Consulting provider 7A - 7P or covering provider during after hours 7P -7A, for this patient?  Check the care team in [facility] and look for a) attending/consulting [facility] provider listed and b) the [facility] team listed. Page or secure chat 7A-7P. Log into [email or web address] and use [place]'s universal password to access. If you do not have the password, please contact the hospital operator. Locate the [facility] provider you are looking for under [facility] and page to a number that you can be directly reached. If you still have difficulty reaching the provider, please page the [facility] (Director on Call) for the Hospitalists listed on amion for assistance.

## 2021-06-13 NOTE — Progress Notes (Signed)
Daily Progress Note   Patient Name: Yolanda Hopkins       Date: 06/11/21 DOB: 1941-01-12  Age: 81 y.o. MRN#: [id number] Attending Physician: Hughie Closs, MD Primary Care Physician: Rebekah Chesterfield, NP Admit Date: 06/07/2021  Reason for Consultation/Follow-up: end of life care, symptom management  HPI/Patient Profile: 81 y.o. female  with past medical history of dementia, HTN, and CVA in 2021 presenting to the emergency department on 05/16/2021 with a fall. Family found her down at home. On presentation, she was unresponsive other than to pain. Code stroke was called in the ED. CTA head showed acute left temporal hemorrhage anteriorly displacing the left MCA branches. After discussion with neurology, the family agreed to proceed with comfort measures. Admitted to [facility] for end of life care.   Subjective: Yolanda Hopkins is resting. Her breathing is a bit labored. Daughter is at bedside.  One dose of IV robinul last night.  No morphine boluses.  Administered morphine bolus at bedside for labored breathing- discussed with nursing and requesting recheck in 15 min.  Patient continues to wear [place] O2 at 1L. Discussed with daughter- she does not wear O2 at baseline- not likely contributing to comfort at this point. DC'd.   Length of Stay: 4  Current Medications: Scheduled Meds:    Continuous Infusions:  morphine 2 mg/hr (05/19/21 1248)    PRN Meds: acetaminophen **OR** acetaminophen, antiseptic oral rinse, diphenhydrAMINE, glycopyrrolate **OR** glycopyrrolate **OR** glycopyrrolate, haloperidol **OR** haloperidol **OR** haloperidol lactate, LORazepam **OR** LORazepam **OR** LORazepam, morphine, ondansetron **OR** ondansetron (ZOFRAN) IV, polyvinyl alcohol     Vital Signs: BP (!) 65/40 (BP Location:  Left Arm)    Pulse (!) 117    Temp (!) 101.4 F (38.6 C) (Oral)    Resp 15    Ht 5' (1.524 m)    SpO2 (!) 70%    BMI 25.00 kg/m  SpO2: SpO2: (!) 70 % O2 Device: O2 Device: Nasal Cannula O2 Flow Rate: O2 Flow Rate (L/min): 2 L/min       Palliative Assessment/Data: PPS 10%      Palliative Care Assessment & Plan   Assessment: 81 year old female who suffered a large ICH and is actively dying.    Recommendations/Plan: Continue comfort care Continue morphine infusion- bolus dose administered at bedside- will follow for effectiveness- discussed with RN-  encouraged aggressive use of boluses for comfort. D/c oxygen Yolanda Hopkins as this is not contributing to comfort, can increase discomfort and prolong dying process Patient likely not stable for transfer to residential hospice facility PMT will continue to follow  Goals of Care and Additional Recommendations: Limitations on Scope of Treatment: Full Comfort Care  Code Status: DNR/DNI  Prognosis:  Hours - Days  Discharge Planning: Anticipated Hospital Death   Thank you for allowing the Palliative Medicine Team to assist in the care of this patient.  Ocie Bob, AGNP-C Palliative Medicine   Please contact Palliative Medicine Team phone at [phone] for questions and concerns.

## 2021-06-13 NOTE — Progress Notes (Signed)
° ° °  OVERNIGHT PROGRESS REPORT  Notified by RN that patient has expired at 2327  Patient was DNR/comfort care followed by Palliative  2 RN verified.  Family was not immediately available to RN.     Chinita Greenland MSNA ACNPC-AG Acute Care Nurse Practitioner [facility]

## 2021-06-13 NOTE — Care Management Important Message (Signed)
Important Message  Patient Details  Name: Yolanda Hopkins MRN: [id number] Date of Birth: Sep 13, 1940   Medicare Important Message Given:  Other (see comment)     Sherilyn Banker 05/31/2021, 12:00 PM

## 2021-06-13 NOTE — Death Summary Note (Signed)
Death Summary  Yolanda Hopkins CZY:[id number] DOB: 08/03/1940 DOA: June 10, 2021  PCP: Rebekah Chesterfield, NP PCP/Office notified:   Admit date: 10-Jun-2021 Date of Death: 2021/06/14  Final Diagnoses:  Principal Problem:   Admission for end of life care Active Problems:   HTN (hypertension)   Dyslipidemia   Acute CVA (cerebrovascular accident) (HCC)   Dementia (HCC)   Intracerebral hemorrhage    Intracerebral hemorrhage with cerebral edema, mass-effect and midline shift Cerebral compression hypertensive urgency  History of present illness: [name] is a 81 y.o. female with medical history significant of dementia; HTN; and CVA in 2021 presenting with a fall.  Family found her down at home.  She is unresponsive other than somewhat to pain.  After discussion with neurology, the family agreed to proceed with comfort measures.  They are in agreement with morphine drip.  They prefer that she remain at [facility] for now.  They will go look at [facility] to assess the facility.   Hospital Course:  Yolanda Hopkins was diagnosed with intracerebral hemorrhage with cerebral compression and midline shift with hypertensive urgency and was admitted for end-of-life care and was transitioned to full comfort care right away and eventually passed away at 2327 on Jun 14, 2021.  Time: 15  Signed:  Hughie Closs  [facility] 05/22/2021, 8:40 AM

## 2021-06-13 NOTE — Progress Notes (Signed)
Pt expired at 2327, Ova Bolhuis (son) made aware; he's letting the rest of the family know. Will call back if decides to come see the pt.

## 2021-06-13 NOTE — Progress Notes (Signed)
1/9 Respectfully mailed to patient's address due to End of Life status.

## 2021-06-13 DEATH — deceased

## 2022-02-10 IMAGING — CT CT HEAD CODE STROKE
3 of 6 series · 13 of 47 positions shown, 15 images · non-contrast
Comparison: Brain MRI 07/30/2019.  Head CT 08/09/2019.

CLINICAL DATA: Code stroke.  80-year-old female.  Hypertensive.

EXAM:
CT HEAD WITHOUT CONTRAST
TECHNIQUE: Contiguous axial images were obtained from the base of the skull
through the vertex without intravenous contrast.

[Series 3: head 5.0 st · axial · 0.45mm/px · z∈[+994,+1134]mm · 8 of 34 slices shown, 10 images]
[im 3/34  brain]
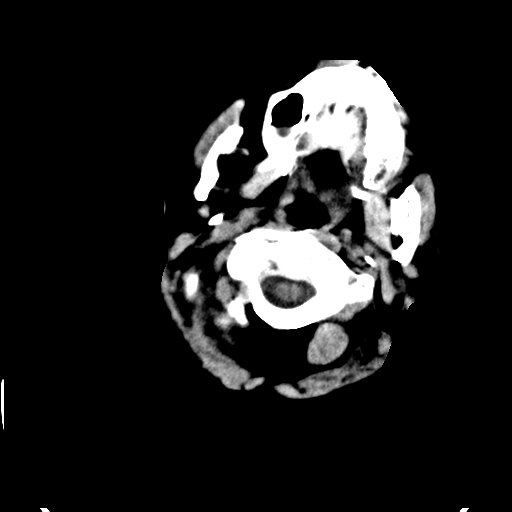
[im 3/34  bone]
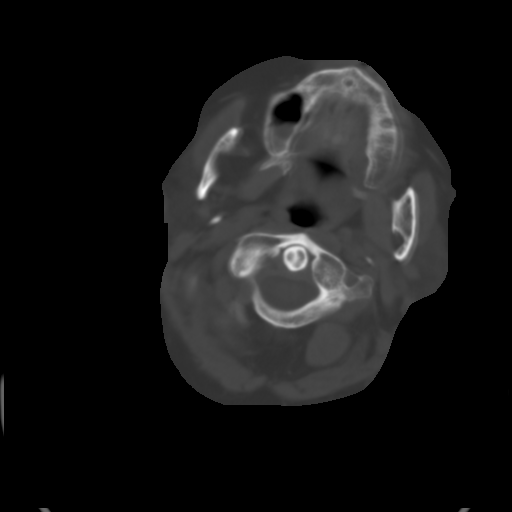
[im 8/34  brain]
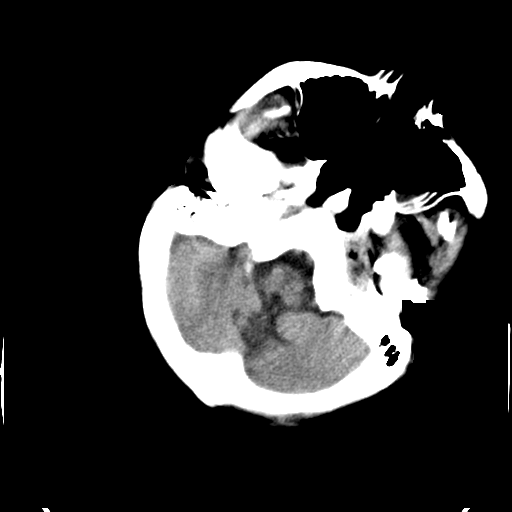
[im 12/34  brain]
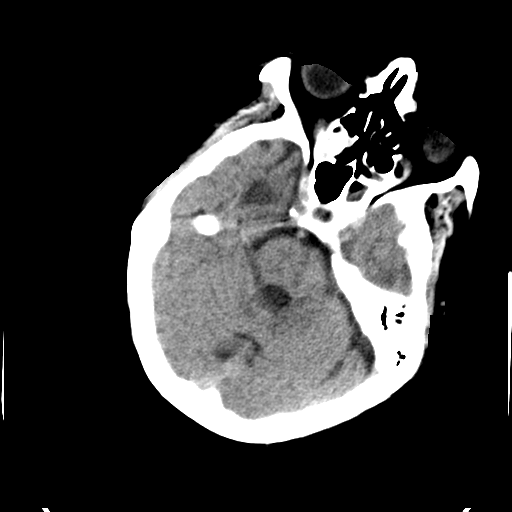
[im 15/34  brain]
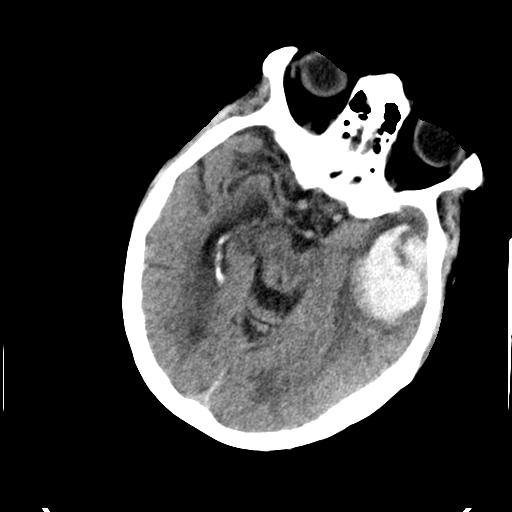
[im 19/34  brain]
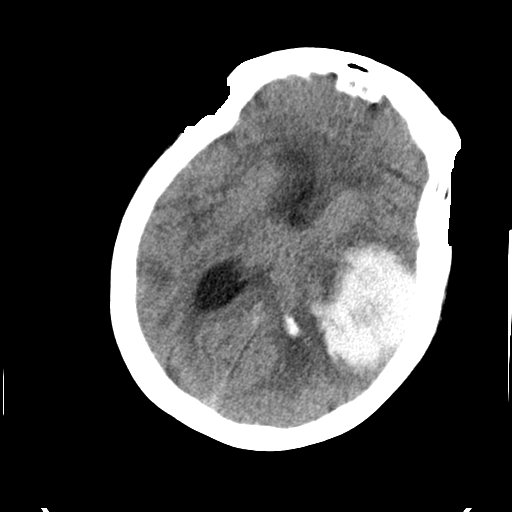
[im 19/34  bone]
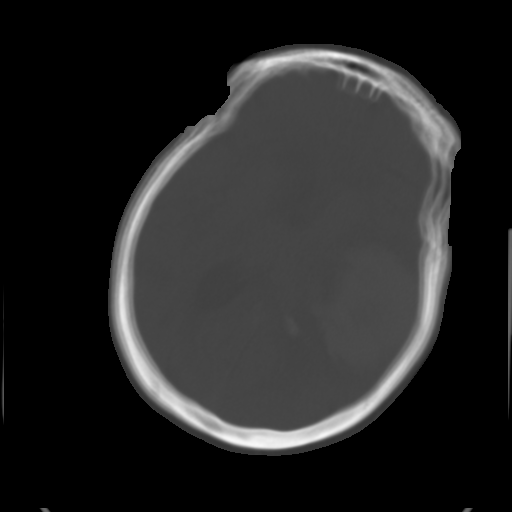
[im 22/34  brain]
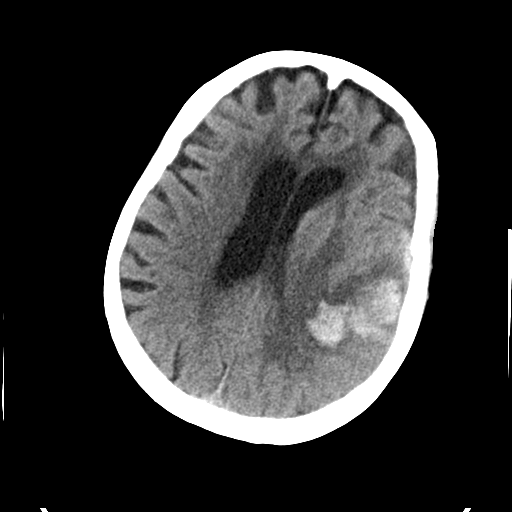
[im 26/34  brain]
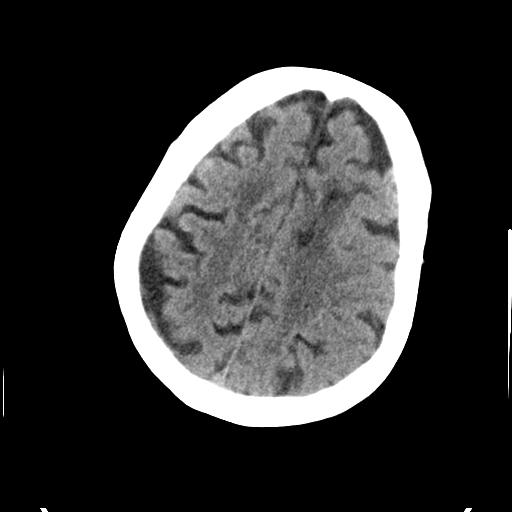
[im 31/34  brain]
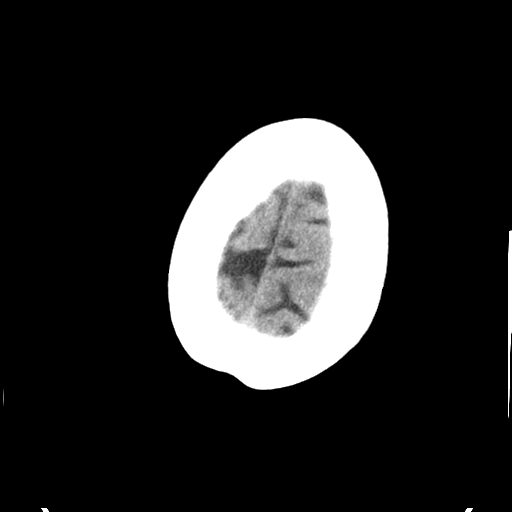

[Series 4: head 3.0 cor st · coronal · 0.39mm/px · 3 of 69 slices shown]
[im 20/69  brain]
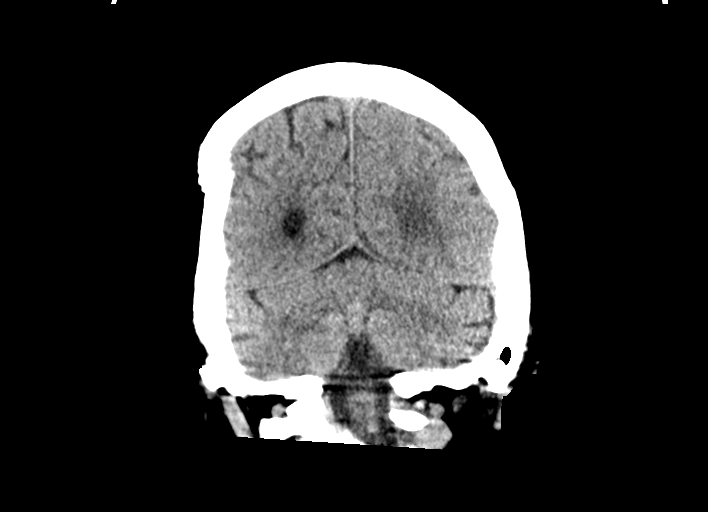
[im 35/69  brain]
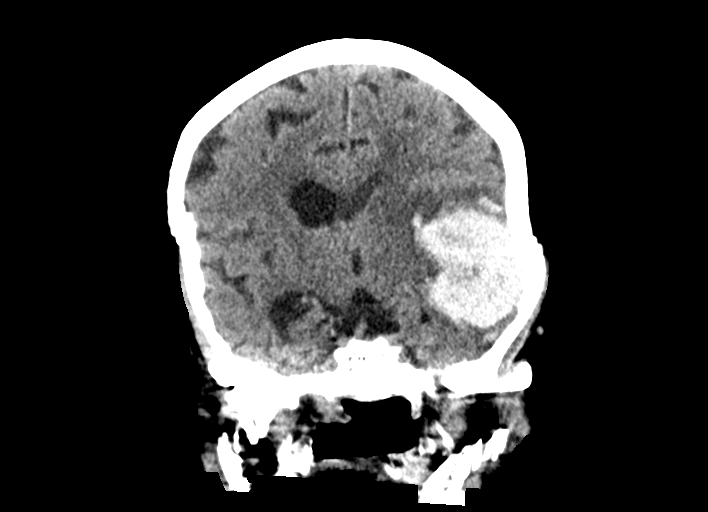
[im 50/69  brain]
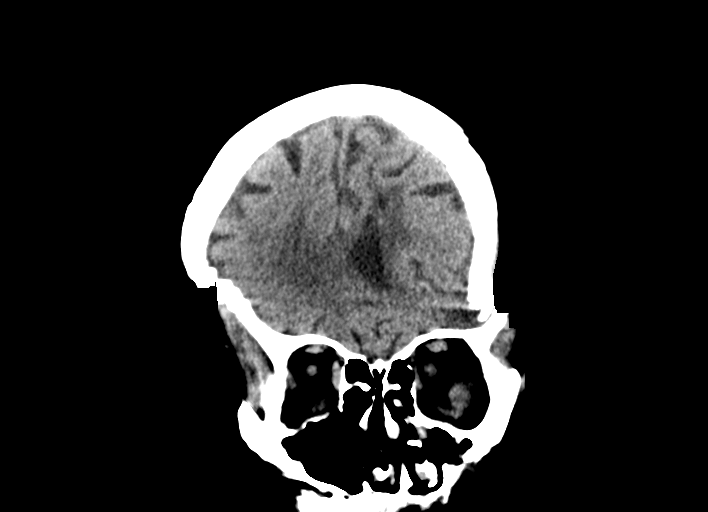

[Series 10: head 3.0 sag st · sagittal · 0.37mm/px · 2 of 64 slices shown]
[im 22/64  brain]
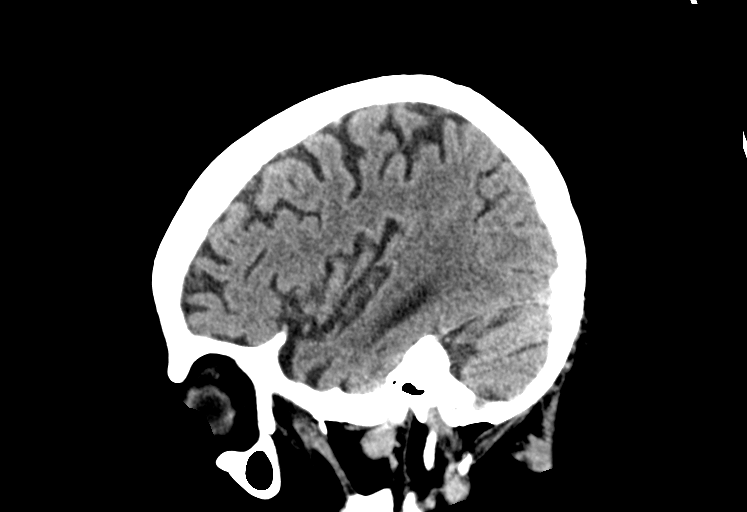
[im 43/64  brain]
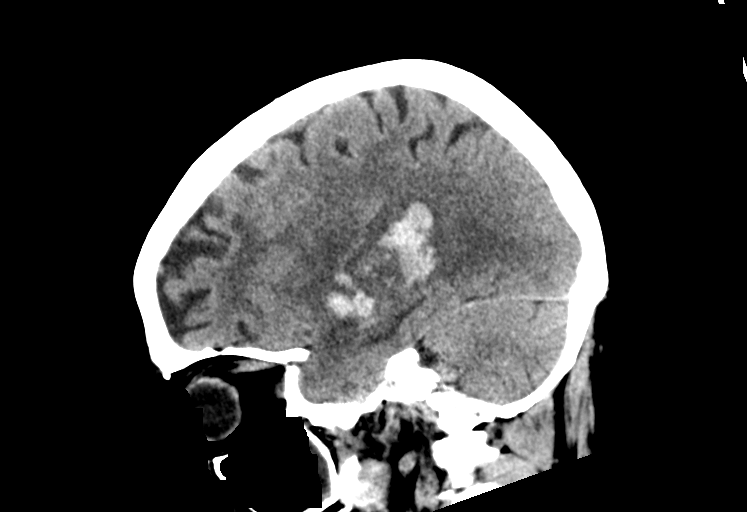

[13 of 47 positions shown; findings below may reference images not displayed]

FINDINGS: Brain: Lobulated hyperdense intra-axial hemorrhage in the left
hemisphere encompasses 80 by 45 by 42 mm (AP by transverse by CC)
and extends throughout the left temporal lobe. Estimated intra-axial
blood volume 76 mL. Surrounding edema, and vasogenic edema tracking
into the central left hemisphere. No intraventricular extension of
blood, subtotal effaced left lateral ventricle. There does appear to
be a small volume of extension into the left subarachnoid space such
as on series 7, image 21. No subdural space extension suspected.

Effaced left lateral ventricle but only mild rightward midline shift
of 4 mm at this time. Basilar cisterns remain normal. No
ventriculomegaly.

Underlying patchy chronic cerebral white matter hypodensity appears
not significantly changed.

Vascular: Calcified atherosclerosis at the skull base. No suspicious
intracranial vascular hyperdensity.

Skull: Intermittent motion artifact. No acute osseous abnormality
identified.

Sinuses/Orbits: Chronic paranasal sinus inflammation with left
sphenoid periosteal thickening. Scattered sinus opacification, fluid
not significantly changed. Tympanic cavities and mastoids remain
clear.

Other: Motion artifact. Leftward gaze, but no other acute orbit or
scalp soft tissue finding.

ASPECTS (Alberta Stroke Program Early CT Score)

Total score (0-10 with 10 being normal): Not applicable, large left
hemisphere intra-axial hemorrhage.
IMPRESSION: 1. Large acute intra-axial hemorrhage of the left temporal lobe,
estimated at 76 mL. Regional edema, tracking into the white matter
capsules. Small volume YETER. No IVH or subdural blood identified.
2. Mass effect on the left lateral ventricle with mild rightward
midline shift of 4 mm at this time. Basilar cisterns remain patent.

Critical Value/emergent results were called by telephone at the time
of interpretation on 05/17/2021 at [DATE] hours to Dr. Jesu who
verbally acknowledged these results.
